# Patient Record
Sex: Male | Born: 1955 | Race: Black or African American | Hispanic: Yes | Marital: Married | Smoking: Current every day smoker
Health system: Southern US, Community
[De-identification: ages and names within clinical notes are randomized; demographics above are authoritative.]

## PROBLEM LIST (undated history)

## (undated) DIAGNOSIS — I219 Acute myocardial infarction, unspecified: Secondary | ICD-10-CM

## (undated) DIAGNOSIS — I519 Heart disease, unspecified: Secondary | ICD-10-CM

## (undated) DIAGNOSIS — I739 Peripheral vascular disease, unspecified: Secondary | ICD-10-CM

## (undated) DIAGNOSIS — Z87442 Personal history of urinary calculi: Secondary | ICD-10-CM

## (undated) DIAGNOSIS — K5792 Diverticulitis of intestine, part unspecified, without perforation or abscess without bleeding: Secondary | ICD-10-CM

## (undated) DIAGNOSIS — E119 Type 2 diabetes mellitus without complications: Secondary | ICD-10-CM

## (undated) DIAGNOSIS — I2510 Atherosclerotic heart disease of native coronary artery without angina pectoris: Secondary | ICD-10-CM

## (undated) DIAGNOSIS — K219 Gastro-esophageal reflux disease without esophagitis: Secondary | ICD-10-CM

## (undated) DIAGNOSIS — I10 Essential (primary) hypertension: Secondary | ICD-10-CM

## (undated) DIAGNOSIS — D649 Anemia, unspecified: Secondary | ICD-10-CM

## (undated) DIAGNOSIS — I639 Cerebral infarction, unspecified: Secondary | ICD-10-CM

## (undated) DIAGNOSIS — G459 Transient cerebral ischemic attack, unspecified: Secondary | ICD-10-CM

## (undated) HISTORY — PX: CARDIAC CATHETERIZATION: SHX172

## (undated) HISTORY — PX: CORONARY ANGIOPLASTY WITH STENT PLACEMENT: SHX49

## (undated) HISTORY — DX: Diverticulitis of intestine, part unspecified, without perforation or abscess without bleeding: K57.92

## (undated) HISTORY — DX: Type 2 diabetes mellitus without complications: E11.9

## (undated) HISTORY — DX: Atherosclerotic heart disease of native coronary artery without angina pectoris: I25.10

## (undated) HISTORY — DX: Cerebral infarction, unspecified: I63.9

## (undated) HISTORY — DX: Heart disease, unspecified: I51.9

## (undated) HISTORY — DX: Essential (primary) hypertension: I10

---

## 2008-10-07 HISTORY — PX: CORONARY ANGIOPLASTY WITH STENT PLACEMENT: SHX49

## 2015-10-08 DIAGNOSIS — G459 Transient cerebral ischemic attack, unspecified: Secondary | ICD-10-CM

## 2015-10-08 HISTORY — DX: Transient cerebral ischemic attack, unspecified: G45.9

## 2016-10-07 DIAGNOSIS — I639 Cerebral infarction, unspecified: Secondary | ICD-10-CM

## 2016-10-07 HISTORY — DX: Cerebral infarction, unspecified: I63.9

## 2018-02-05 DIAGNOSIS — E782 Mixed hyperlipidemia: Secondary | ICD-10-CM | POA: Insufficient documentation

## 2018-09-09 ENCOUNTER — Encounter: Payer: Self-pay | Admitting: Family Medicine

## 2018-09-09 ENCOUNTER — Ambulatory Visit: Payer: BLUE CROSS/BLUE SHIELD | Admitting: Family Medicine

## 2018-09-09 ENCOUNTER — Other Ambulatory Visit (HOSPITAL_COMMUNITY)
Admission: RE | Admit: 2018-09-09 | Discharge: 2018-09-09 | Disposition: A | Discharge status: Home / Self Care | Payer: BLUE CROSS/BLUE SHIELD | Source: Ambulatory Visit | Attending: Family Medicine | Admitting: Family Medicine

## 2018-09-09 VITALS — BP 130/80 | HR 99 | Temp 98.2°F | Wt 206.0 lb

## 2018-09-09 DIAGNOSIS — Z794 Long term (current) use of insulin: Secondary | ICD-10-CM | POA: Diagnosis not present

## 2018-09-09 DIAGNOSIS — I10 Essential (primary) hypertension: Secondary | ICD-10-CM

## 2018-09-09 DIAGNOSIS — E119 Type 2 diabetes mellitus without complications: Secondary | ICD-10-CM | POA: Diagnosis not present

## 2018-09-09 DIAGNOSIS — I2510 Atherosclerotic heart disease of native coronary artery without angina pectoris: Secondary | ICD-10-CM | POA: Diagnosis not present

## 2018-09-09 DIAGNOSIS — I63412 Cerebral infarction due to embolism of left middle cerebral artery: Secondary | ICD-10-CM

## 2018-09-09 DIAGNOSIS — E1165 Type 2 diabetes mellitus with hyperglycemia: Secondary | ICD-10-CM | POA: Insufficient documentation

## 2018-09-09 DIAGNOSIS — Z202 Contact with and (suspected) exposure to infections with a predominantly sexual mode of transmission: Secondary | ICD-10-CM

## 2018-09-09 DIAGNOSIS — Z8673 Personal history of transient ischemic attack (TIA), and cerebral infarction without residual deficits: Secondary | ICD-10-CM | POA: Insufficient documentation

## 2018-09-09 LAB — COMPREHENSIVE METABOLIC PANEL
ALT: 21 U/L (ref 0–53)
AST: 14 U/L (ref 0–37)
Albumin: 4.3 g/dL (ref 3.5–5.2)
Alkaline Phosphatase: 98 U/L (ref 39–117)
BUN: 14 mg/dL (ref 6–23)
CO2: 23 mEq/L (ref 19–32)
Calcium: 9.4 mg/dL (ref 8.4–10.5)
Chloride: 105 mEq/L (ref 96–112)
Creatinine, Ser: 0.92 mg/dL (ref 0.40–1.50)
GFR: 107.54 mL/min (ref >60.00)
Glucose, Bld: 237 mg/dL — ABNORMAL HIGH (ref 70–99)
Potassium: 3.9 mEq/L (ref 3.5–5.1)
Sodium: 136 mEq/L (ref 135–145)
Total Bilirubin: 0.6 mg/dL (ref 0.2–1.2)
Total Protein: 7.4 g/dL (ref 6.0–8.3)

## 2018-09-09 LAB — CBC
HCT: 40.1 % (ref 39.0–52.0)
Hemoglobin: 12.9 g/dL — ABNORMAL LOW (ref 13.0–17.0)
MCHC: 32.1 g/dL (ref 30.0–36.0)
MCV: 78.1 fl (ref 78.0–100.0)
Platelets: 252 10*3/uL (ref 150.0–400.0)
RBC: 5.14 Mil/uL (ref 4.22–5.81)
RDW: 24.7 % — ABNORMAL HIGH (ref 11.5–15.5)
WBC: 8 10*3/uL (ref 4.0–10.5)

## 2018-09-09 LAB — LDL CHOLESTEROL, DIRECT: Direct LDL: 112 mg/dL

## 2018-09-09 LAB — MICROALBUMIN / CREATININE URINE RATIO
Creatinine,U: 170.6 mg/dL
Microalb Creat Ratio: 1.9 mg/g (ref 0.0–30.0)
Microalb, Ur: 3.2 mg/dL — ABNORMAL HIGH (ref 0.0–1.9)

## 2018-09-09 LAB — HEMOGLOBIN A1C: Hgb A1c MFr Bld: 10.2 % — ABNORMAL HIGH (ref 4.6–6.5)

## 2018-09-09 MED ORDER — ASPIRIN-DIPYRIDAMOLE ER 25-200 MG PO CP12: 1.0000 | ORAL_CAPSULE | Freq: Two times a day (BID) | ORAL | 3 refills | Status: DC

## 2018-09-09 MED ORDER — ATORVASTATIN CALCIUM 80 MG PO TABS: 80.0000 mg | ORAL_TABLET | Freq: Every day | ORAL | 3 refills | Status: DC

## 2018-09-09 MED ORDER — BASAGLAR KWIKPEN 100 UNIT/ML ~~LOC~~ SOPN: 50.0000 [IU] | PEN_INJECTOR | Freq: Every day | SUBCUTANEOUS | 3 refills | Status: DC

## 2018-09-09 MED ORDER — INSULIN LISPRO (1 UNIT DIAL) 100 UNIT/ML (KWIKPEN): 15.0000 [IU] | PEN_INJECTOR | Freq: Three times a day (TID) | SUBCUTANEOUS | 3 refills | Status: DC

## 2018-09-09 MED ORDER — LISINOPRIL 10 MG PO TABS: 10.0000 mg | ORAL_TABLET | Freq: Every day | ORAL | 3 refills | Status: DC

## 2018-09-09 MED ORDER — METOPROLOL TARTRATE 25 MG PO TABS: 25.0000 mg | ORAL_TABLET | Freq: Two times a day (BID) | ORAL | 3 refills | Status: DC

## 2018-09-09 NOTE — Progress Notes (Signed)
Erik Rose - 62 y.o. male MRN [id number]  Date of birth: 1956-04-24  Subjective Chief Complaint  Patient presents with  . Hypertension    HPI Erik Rose is a 62 y.o. male with history of HTN, T2DM, previous CVA and CAD here today to establish care.  He is new to [place] after moving down from [place].  He has had regular medical care while in [place] but is needing refills of all medications.   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  He is taking aggrenox daily and is doing well.  He denies bleeding or gi upset.  He also has history of coronary stent.  He denies anginal symptoms, shortness of breath, palpitations headache or vision changes.  His BP has been well controlled with lopressor and lisinopril.  He is compliant with lipitor 35m.    -T2DM:  Current treatment with Basaglar 50 units daily and humalog 15 units with meals.  He is doing well with this and denies side effects including symptoms of hypoglycemia.  He is unsure of last a1c.  He had eye exam and flu vaccine prior to moving to [place].  He is unsure if he has had pneumonia vaccine.   ROS:  A comprehensive ROS was completed and negative except as noted per HPI   He also wishes to be checked to STI's.  He denies symptoms at this time.   No Known Allergies  Past Medical History:  Diagnosis Date  . Diabetes mellitus without complication ([place]   . Diverticulitis   . Heart disease   . Hypertension   . Stroke ([facility]     Past Surgical History:  Procedure Laterality Date  . CARDIAC CATHETERIZATION    . CORONARY ANGIOPLASTY WITH STENT PLACEMENT      Social History   Socioeconomic History  . Marital status: Legally Separated    Spouse name: Not on file  . Number of children: Not on file  . Years of education: Not on file  . Highest education level: Not on file  Occupational History  . Not on file  Social Needs  . Financial resource strain: Not on file  . Food insecurity:    Worry:  Not on file    Inability: Not on file  . Transportation needs:    Medical: Not on file    Non-medical: Not on file  Tobacco Use  . Smoking status: Current Some Day Smoker  . Smokeless tobacco: Never Used  Substance and Sexual Activity  . Alcohol use: Never    Frequency: Never  . Drug use: Never  . Sexual activity: Not on file  Lifestyle  . Physical activity:    Days per week: Not on file    Minutes per session: Not on file  . Stress: Not on file  Relationships  . Social connections:    Talks on phone: Not on file    Gets together: Not on file    Attends religious service: Not on file    Active member of club or organization: Not on file    Attends meetings of clubs or organizations: Not on file    Relationship status: Not on file  Other Topics Concern  . Not on file  Social History Narrative  . Not on file    Family History  Problem Relation Age of Onset  . Hypertension Mother   . Kidney disease Mother   . Miscarriages / [place]   . Stroke Mother   .  Heart disease Father   . Depression Sister   . Kidney disease Sister   . Diabetes Brother   . Hypertension Brother   . Hyperlipidemia Brother   . Kidney disease Brother     Health Maintenance  Topic Date Due  . Hepatitis C Screening  Apr 04, 1956  . HIV Screening  08/12/1972  . TETANUS/TDAP  08/12/1976  . COLONOSCOPY  08/13/2007  . INFLUENZA VACCINE  Completed    ----------------------------------------------------------------------------------------------------------------------------------------------------------------------------------------------------------------- Physical Exam BP 130/80   Pulse 99   Temp 98.2 F (36.8 C)   Wt 206 lb (93.4 kg)   SpO2 99%   Physical Exam  Constitutional: He is oriented to person, place, and time. He appears well-nourished. No distress.  HENT:  Head: Normocephalic and atraumatic.  Mouth/Throat: Oropharynx is clear and moist.  Eyes: No scleral icterus.    Neck: Neck supple. No thyromegaly present.  Cardiovascular: Normal rate, regular rhythm and normal heart sounds.  Pulmonary/Chest: Effort normal and breath sounds normal.  Musculoskeletal: He exhibits no edema.  Lymphadenopathy:    He has no cervical adenopathy.  Neurological: He is alert and oriented to person, place, and time.  Skin: Skin is warm and dry.  Psychiatric: He has a normal mood and affect. His behavior is normal.    ------------------------------------------------------------------------------------------------------------------------------------------------------------------------------------------------------------------- Assessment and Plan  CVA (cerebral vascular accident) ([place]) -Mild RUE residual -He is on aggrenox and lipitor, will continue.   Essential hypertension -BP is well controlled, will continue metoprolol and lisinopril.  -Reviewed low salt diet  Type 2 diabetes mellitus without complication, with long-term current use of insulin (HCC) -Insulin refilled -Check A1c and microalbumin today.   Orders Placed This Encounter  Procedures  . Comp Met (CMET)  . CBC  . HgB A1c  . Urine Microalbumin w/creat. ratio  . Direct LDL  . HIV antibody (with reflex)  . RPR

## 2018-09-09 NOTE — Assessment & Plan Note (Signed)
-  BP is well controlled, will continue metoprolol and lisinopril.  -Reviewed low salt diet

## 2018-09-09 NOTE — Patient Instructions (Signed)
Welcome! It was great to meet you today! I have sent in your medications and we will call you once your labwork returns.  See me again in 3 months

## 2018-09-09 NOTE — Assessment & Plan Note (Signed)
-  Mild RUE residual -He is on aggrenox and lipitor, will continue.

## 2018-09-09 NOTE — Assessment & Plan Note (Signed)
-  Insulin refilled -Check A1c and microalbumin today.

## 2018-09-10 LAB — RPR: RPR Ser Ql: NONREACTIVE

## 2018-09-10 LAB — HIV ANTIBODY (ROUTINE TESTING W REFLEX): HIV 1&2 Ab, 4th Generation: NONREACTIVE

## 2018-09-15 ENCOUNTER — Ambulatory Visit: Payer: Self-pay

## 2018-09-15 NOTE — Telephone Encounter (Signed)
Spoke with patient-will provide sample for testing tomorrow.

## 2018-09-15 NOTE — Telephone Encounter (Signed)
Pt has called several times for his lab results. No result notes available to release.

## 2018-09-15 NOTE — Progress Notes (Signed)
Please check on urine cytology results for GC/Chlamydia.  Thanks

## 2018-09-16 ENCOUNTER — Other Ambulatory Visit: Payer: BLUE CROSS/BLUE SHIELD

## 2018-09-16 NOTE — Progress Notes (Signed)
Urine sample missed from 12/4. Urine cytology ordered after collection and patient having left. Pt came in today 09/16/18 to collect urine for cytology order on 12/4.

## 2018-09-17 LAB — URINE CYTOLOGY ANCILLARY ONLY
Chlamydia: NEGATIVE
Neisseria Gonorrhea: NEGATIVE

## 2018-09-18 ENCOUNTER — Other Ambulatory Visit: Payer: Self-pay | Admitting: *Deleted

## 2018-09-18 DIAGNOSIS — E119 Type 2 diabetes mellitus without complications: Secondary | ICD-10-CM

## 2018-09-18 DIAGNOSIS — Z794 Long term (current) use of insulin: Secondary | ICD-10-CM

## 2018-09-18 MED ORDER — GLUCOSE BLOOD VI STRP: ORAL_STRIP | 12 refills | Status: DC

## 2018-09-18 MED ORDER — ONETOUCH ULTRA 2 W/DEVICE KIT: PACK | 0 refills | Status: DC

## 2018-09-18 NOTE — Progress Notes (Signed)
-  A1c is high at 10.2%, recommend increase of basaglar to 60 units.  -STD testing is negative.

## 2018-12-09 ENCOUNTER — Encounter: Payer: Self-pay | Admitting: Family Medicine

## 2018-12-09 ENCOUNTER — Other Ambulatory Visit: Payer: Self-pay

## 2018-12-09 ENCOUNTER — Ambulatory Visit: Payer: BLUE CROSS/BLUE SHIELD | Admitting: Family Medicine

## 2018-12-09 VITALS — BP 138/82 | HR 88 | Temp 98.0°F | Ht 70.0 in | Wt 199.6 lb

## 2018-12-09 DIAGNOSIS — Z23 Encounter for immunization: Secondary | ICD-10-CM | POA: Diagnosis not present

## 2018-12-09 DIAGNOSIS — K5730 Diverticulosis of large intestine without perforation or abscess without bleeding: Secondary | ICD-10-CM

## 2018-12-09 DIAGNOSIS — Z8673 Personal history of transient ischemic attack (TIA), and cerebral infarction without residual deficits: Secondary | ICD-10-CM

## 2018-12-09 DIAGNOSIS — Z1211 Encounter for screening for malignant neoplasm of colon: Secondary | ICD-10-CM

## 2018-12-09 DIAGNOSIS — I10 Essential (primary) hypertension: Secondary | ICD-10-CM

## 2018-12-09 DIAGNOSIS — Z794 Long term (current) use of insulin: Secondary | ICD-10-CM | POA: Diagnosis not present

## 2018-12-09 DIAGNOSIS — E1165 Type 2 diabetes mellitus with hyperglycemia: Secondary | ICD-10-CM | POA: Diagnosis not present

## 2018-12-09 DIAGNOSIS — I2510 Atherosclerotic heart disease of native coronary artery without angina pectoris: Secondary | ICD-10-CM

## 2018-12-09 DIAGNOSIS — Z86010 Personal history of colonic polyps: Secondary | ICD-10-CM

## 2018-12-09 LAB — HEMOGLOBIN A1C: Hgb A1c MFr Bld: 9.6 % — ABNORMAL HIGH (ref 4.6–6.5)

## 2018-12-09 MED ORDER — ASPIRIN-DIPYRIDAMOLE ER 25-200 MG PO CP12: 1.0000 | ORAL_CAPSULE | Freq: Two times a day (BID) | ORAL | 3 refills | Status: DC

## 2018-12-09 NOTE — Progress Notes (Signed)
Erik Rose - 63 y.o. male MRN [id number]  Date of birth: May 26, 1956  Subjective Chief Complaint  Patient presents with  . Follow-up    also discuss aggrenox   . Diabetes    Pt has been checking his sugars 3 times a day, ranging in the 100s-130s   . Hypertension    HPI Erik Rose is a 63 y.o. male here today for f/u of DM and HTN.   -HTN:  Curent treatment with lisinopril and metoprolol.  Doing well with current medications and denies side effects.  He follow a low salt diet and remains fairly active.   -DM:  Current management with basaglar 60 units daily and humalog 15 units with meals.  He reports glucose readings at home are typically 100-130.  He has had an occasional low reading into the upper 50s if not eating properly.  He continues to walk for exercise.  He needs updated eye exam.    -History of CVA/CAD:  History of MI with stenting as well as CVA.  Still with mild RUE deficit.  He continues on daily aggrenox and atorvastatin 80mg .  He has not had any anginal symptoms, dyspnea on exertion, or palpitations.   Records from [place] via [facility], vaccines and screenings updated.  Had colonoscopy in 2017 following GI bleed with pancolonic diverticulosis.  Also with history of colonic polyps and recommended to have colonoscopy q3 years.   ROS:  A comprehensive ROS was completed and negative except as noted per HPI   No Known Allergies  Past Medical History:  Diagnosis Date  . Diabetes mellitus without complication (HCC)   . Diverticulitis   . Heart disease   . Hypertension   . Stroke [facility])     Past Surgical History:  Procedure Laterality Date  . CARDIAC CATHETERIZATION    . CORONARY ANGIOPLASTY WITH STENT PLACEMENT      Social History   Socioeconomic History  . Marital status: Legally Separated    Spouse name: Not on file  . Number of children: Not on file  . Years of education: Not on file  . Highest education level: Not on file    Occupational History  . Not on file  Social Needs  . Financial resource strain: Not on file  . Food insecurity:    Worry: Not on file    Inability: Not on file  . Transportation needs:    Medical: Not on file    Non-medical: Not on file  Tobacco Use  . Smoking status: Current Some Day Smoker  . Smokeless tobacco: Never Used  Substance and Sexual Activity  . Alcohol use: Never    Frequency: Never  . Drug use: Never  . Sexual activity: Yes  Lifestyle  . Physical activity:    Days per week: Not on file    Minutes per session: Not on file  . Stress: Not on file  Relationships  . Social connections:    Talks on phone: Not on file    Gets together: Not on file    Attends religious service: Not on file    Active member of club or organization: Not on file    Attends meetings of clubs or organizations: Not on file    Relationship status: Not on file  Other Topics Concern  . Not on file  Social History Narrative  . Not on file    Family History  Problem Relation Age of Onset  . Hypertension Mother   . Kidney disease  Mother   . Miscarriages / [place] Mother   . Stroke Mother   . Heart disease Father   . Depression Sister   . Kidney disease Sister   . Diabetes Brother   . Hypertension Brother   . Hyperlipidemia Brother   . Kidney disease Brother     Health Maintenance  Topic Date Due  . Hepatitis C Screening  02/14/1956  . PNEUMOCOCCAL POLYSACCHARIDE VACCINE AGE 17-64 HIGH RISK  08/13/1959  . FOOT EXAM  08/13/1967  . OPHTHALMOLOGY EXAM  08/13/1967  . TETANUS/TDAP  08/12/1976  . COLONOSCOPY  01/17/2019  . HEMOGLOBIN A1C  03/11/2019  . INFLUENZA VACCINE  Completed  . HIV Screening  Completed    ----------------------------------------------------------------------------------------------------------------------------------------------------------------------------------------------------------------- Physical Exam BP 138/82 (BP Location: Left Arm, Patient  Position: Sitting, Cuff Size: Normal)   Pulse 88   Temp 98 F (36.7 C) (Oral)   Ht 5\' 10"  (1.778 m)   Wt 199 lb 9.6 oz (90.5 kg)   SpO2 100%   BMI 28.64 kg/m   Physical Exam Constitutional:      Appearance: Normal appearance.  HENT:     Head: Normocephalic and atraumatic.     Right Ear: Tympanic membrane normal.     Left Ear: Tympanic membrane normal.     Mouth/Throat:     Mouth: Mucous membranes are moist.  Eyes:     General: No scleral icterus. Neck:     Musculoskeletal: Neck supple.  Cardiovascular:     Rate and Rhythm: Normal rate and regular rhythm.  Pulmonary:     Effort: Pulmonary effort is normal.     Breath sounds: Normal breath sounds.  Skin:    General: Skin is warm and dry.     Findings: No rash.  Neurological:     General: No focal deficit present.     Mental Status: He is alert.  Psychiatric:        Mood and Affect: Mood normal.        Behavior: Behavior normal.     ------------------------------------------------------------------------------------------------------------------------------------------------------------------------------------------------------------------- Assessment and Plan  Essential hypertension -BP well controlled today, continue current medications  Type 2 diabetes mellitus with hyperglycemia, with long-term current use of insulin (HCC) -Previous a1c was quite high, but home glucose readings have been better controlled recently.  -Update today and continue to titrate medications as needed.  -Pneumovax given today -Referral placed to ophthalmology   History of CVA (cerebrovascular accident) -Very mild RUE residual deficits -Continue aggrenox and statin, keep BP under good control.   CAD (coronary artery disease), native coronary artery -Stable without anginal symptoms -Continue current medications.   Colon cancer screening -Previous colonoscopy in [place] 01/2016, recommended to have q3 years. -Referral placed.

## 2018-12-09 NOTE — Assessment & Plan Note (Signed)
-  Very mild RUE residual deficits -Continue aggrenox and statin, keep BP under good control.

## 2018-12-09 NOTE — Assessment & Plan Note (Signed)
-  Previous a1c was quite high, but home glucose readings have been better controlled recently.  -Update today and continue to titrate medications as needed.  -Pneumovax given today -Referral placed to ophthalmology

## 2018-12-09 NOTE — Patient Instructions (Signed)
Diabetes Mellitus and Standards of Medical Care  Managing diabetes (diabetes mellitus) can be complicated. Your diabetes treatment may be managed by a team of health care providers, including:  A physician who specializes in diabetes (endocrinologist).  A nurse practitioner or physician assistant.  Nurses.  A diet and nutrition specialist (registered dietitian).  A certified diabetes educator (CDE).  An exercise specialist.  A pharmacist.  An eye doctor.  A foot specialist (podiatrist).  A dentist.  A primary care provider.  A mental health provider.  Your health care providers follow guidelines to help you get the best quality of care. The following schedule is a general guideline for your diabetes management plan. Your health care providers may give you more specific instructions.  Physical exams  Upon being diagnosed with diabetes mellitus, and each year after that, your health care provider will ask about your medical and family history. He or she will also do a physical exam. Your exam may include:  Measuring your height, weight, and body mass index (BMI).  Checking your blood pressure. This will be done at every routine medical visit. Your target blood pressure may vary depending on your medical conditions, your age, and other factors.  Thyroid gland exam.  Skin exam.  Screening for damage to your nerves (peripheral neuropathy). This may include checking the pulse in your legs and feet and checking the level of sensation in your hands and feet.  A complete foot exam to inspect the structure and skin of your feet, including checking for cuts, bruises, redness, blisters, sores, or other problems.  Screening for blood vessel (vascular) problems, which may include checking the pulse in your legs and feet and checking your temperature.  Blood tests  Depending on your treatment plan and your personal needs, you may have the following tests done:  HbA1c (hemoglobin A1c). This test provides information about blood  sugar (glucose) control over the previous 2-3 months. It is used to adjust your treatment plan, if needed. This test will be done:  At least 2 times a year, if you are meeting your treatment goals.  4 times a year, if you are not meeting your treatment goals or if treatment goals have changed.  Lipid testing, including total, LDL, and HDL cholesterol and triglyceride levels.  The goal for LDL is less than 100 mg/dL (5.5 mmol/L). If you are at high risk for complications, the goal is less than 70 mg/dL (3.9 mmol/L).  The goal for HDL is 40 mg/dL (2.2 mmol/L) or higher for men and 50 mg/dL (2.8 mmol/L) or higher for women. An HDL cholesterol of 60 mg/dL (3.3 mmol/L) or higher gives some protection against heart disease.  The goal for triglycerides is less than 150 mg/dL (8.3 mmol/L).  Liver function tests.  Kidney function tests.  Thyroid function tests.  Dental and eye exams  Visit your dentist two times a year.  If you have type 1 diabetes, your health care provider may recommend an eye exam 3-5 years after you are diagnosed, and then once a year after your first exam.  For children with type 1 diabetes, a health care provider may recommend an eye exam when your child is age 10 or older and has had diabetes for 3-5 years. After the first exam, your child should get an eye exam once a year.  If you have type 2 diabetes, your health care provider may recommend an eye exam as soon as you are diagnosed, and then once a year after   your first exam.  Immunizations    The yearly flu (influenza) vaccine is recommended for everyone 6 months or older who has diabetes.  The pneumonia (pneumococcal) vaccine is recommended for everyone 2 years or older who has diabetes. If you are 65 or older, you may get the pneumonia vaccine as a series of two separate shots.  The hepatitis B vaccine is recommended for adults shortly after being diagnosed with diabetes.  Adults and children with diabetes should receive all other vaccines  according to age-specific recommendations from the Centers for Disease Control and Prevention (CDC).  Mental and emotional health  Screening for symptoms of eating disorders, anxiety, and depression is recommended at the time of diagnosis and afterward as needed. If your screening shows that you have symptoms (positive screening result), you may need more evaluation and you may work with a mental health care provider.  Treatment plan  Your treatment plan will be reviewed at every medical visit. You and your health care provider will discuss:  How you are taking your medicines, including insulin.  Any side effects you are experiencing.  Your blood glucose target goals.  The frequency of your blood glucose monitoring.  Lifestyle habits, such as activity level as well as tobacco, alcohol, and substance use.  Diabetes self-management education  Your health care provider will assess how well you are monitoring your blood glucose levels and whether you are taking your insulin correctly. He or she may refer you to:  A certified diabetes educator to manage your diabetes throughout your life, starting at diagnosis.  A registered dietitian who can create or review your personal nutrition plan.  An exercise specialist who can discuss your activity level and exercise plan.  Summary  Managing diabetes (diabetes mellitus) can be complicated. Your diabetes treatment may be managed by a team of health care providers.  Your health care providers follow guidelines in order to help you get the best quality of care.  Standards of care including having regular physical exams, blood tests, blood pressure monitoring, immunizations, screening tests, and education about how to manage your diabetes.  Your health care providers may also give you more specific instructions based on your individual health.  This information is not intended to replace advice given to you by your health care provider. Make sure you discuss any questions you have  with your health care provider.  Document Released: 07/21/2009 Document Revised: 06/12/2018 Document Reviewed: 06/21/2016  Elsevier Interactive Patient Education  2019 [employer]

## 2018-12-09 NOTE — Assessment & Plan Note (Signed)
-  BP well controlled today, continue current medications

## 2018-12-09 NOTE — Assessment & Plan Note (Signed)
-  Stable without anginal symptoms -Continue current medications.

## 2018-12-09 NOTE — Assessment & Plan Note (Signed)
-  Previous colonoscopy in [place] 01/2016, recommended to have q3 years. -Referral placed.

## 2018-12-14 NOTE — Progress Notes (Signed)
A1c improved compared to last reading but remains elevated.  Please have him check glucose readings 1-2 hours after meals as we may need to increase his humalog dosing at meal time.

## 2018-12-23 DIAGNOSIS — E113393 Type 2 diabetes mellitus with moderate nonproliferative diabetic retinopathy without macular edema, bilateral: Secondary | ICD-10-CM | POA: Diagnosis not present

## 2018-12-23 DIAGNOSIS — H25813 Combined forms of age-related cataract, bilateral: Secondary | ICD-10-CM | POA: Diagnosis not present

## 2018-12-23 DIAGNOSIS — H35033 Hypertensive retinopathy, bilateral: Secondary | ICD-10-CM | POA: Diagnosis not present

## 2019-03-11 ENCOUNTER — Telehealth (INDEPENDENT_AMBULATORY_CARE_PROVIDER_SITE_OTHER): Payer: BLUE CROSS/BLUE SHIELD | Admitting: Family Medicine

## 2019-03-11 ENCOUNTER — Encounter: Payer: Self-pay | Admitting: Family Medicine

## 2019-03-11 VITALS — Temp 97.2°F | Ht 70.0 in | Wt 194.0 lb

## 2019-03-11 DIAGNOSIS — E1165 Type 2 diabetes mellitus with hyperglycemia: Secondary | ICD-10-CM | POA: Diagnosis not present

## 2019-03-11 DIAGNOSIS — I10 Essential (primary) hypertension: Secondary | ICD-10-CM | POA: Diagnosis not present

## 2019-03-11 DIAGNOSIS — Z8673 Personal history of transient ischemic attack (TIA), and cerebral infarction without residual deficits: Secondary | ICD-10-CM | POA: Diagnosis not present

## 2019-03-11 DIAGNOSIS — Z794 Long term (current) use of insulin: Secondary | ICD-10-CM | POA: Diagnosis not present

## 2019-03-11 MED ORDER — BLOOD PRESSURE CUFF MISC: 0 refills | Status: DC

## 2019-03-11 MED ORDER — ASPIRIN-DIPYRIDAMOLE ER 25-200 MG PO CP12: 1.0000 | ORAL_CAPSULE | Freq: Two times a day (BID) | ORAL | 3 refills | Status: DC

## 2019-03-11 NOTE — Progress Notes (Signed)
Erik Rose - 63 y.o. male MRN [id number]  Date of birth: 02-Feb-1956   This visit type was conducted due to national recommendations for restrictions regarding the COVID-19 Pandemic (e.g. social distancing).  This format is felt to be most appropriate for this patient at this time.  All issues noted in this document were discussed and addressed.  No physical exam was performed (except for noted visual exam findings with Video Visits).  I discussed the limitations of evaluation and management by telemedicine and the availability of in person appointments. The patient expressed understanding and agreed to proceed.  I connected with@ on 03/11/19 at 10:30 AM EDT by a video enabled telemedicine application and verified that I am speaking with the correct person using two identifiers.   Patient Location: [place] [id number]   Provider location:   Home office  Chief Complaint  Patient presents with  . Follow-up    3 Mo F/U DM/HTN, meds working, FGBS 40's-330's- depending on meals/snacking at bedtime    HPI  Erik Rose is a 63 y.o. male who presents via audio/video conferencing for a telehealth visit today.  He is following up today for HTN, DM and HLD.  Has been out of aggrenox, unsure why not being filled with other medications.   -HTN:  He continues on lisinopril and metoprolol.  Doing well.  Does not have cuff at home to monitor BP.  Denies symptoms of elevated BP including headache ,vision changes, chest pain, shortness of breath.   -DM: Currently on basaglar 50 units daily with humalog 15 units with meals.  FBG around 120 each morning.  Post prandial readings range from 170-300 depending on what he has eaten.  He has been recording these and will upload to mychart.  He had 1 episode of hypoglycemia a couple of weeks ago.  He was swimming and forgot to eat.  Lowest reading was 71.   -HLD:  History of CVA and CAD, tolerating atorvastatin well.   Denies myalgias.   ROS:  A comprehensive ROS was completed and negative except as noted per HPI  Past Medical History:  Diagnosis Date  . Diabetes mellitus without complication ([place])   . Diverticulitis   . Heart disease   . Hypertension   . Stroke [facility])     Past Surgical History:  Procedure Laterality Date  . CARDIAC CATHETERIZATION    . CORONARY ANGIOPLASTY WITH STENT PLACEMENT      Family History  Problem Relation Age of Onset  . Hypertension Mother   . Kidney disease Mother   . Miscarriages / [place] Mother   . Stroke Mother   . Heart disease Father   . Depression Sister   . Kidney disease Sister   . Diabetes Brother   . Hypertension Brother   . Hyperlipidemia Brother   . Kidney disease Brother     Social History   Socioeconomic History  . Marital status: Legally Separated    Spouse name: Not on file  . Number of children: Not on file  . Years of education: Not on file  . Highest education level: Not on file  Occupational History  . Not on file  Social Needs  . Financial resource strain: Not on file  . Food insecurity:    Worry: Not on file    Inability: Not on file  . Transportation needs:    Medical: Not on file    Non-medical: Not on file  Tobacco Use  .  Smoking status: Current Some Day Smoker  . Smokeless tobacco: Never Used  Substance and Sexual Activity  . Alcohol use: Never    Frequency: Never  . Drug use: Never  . Sexual activity: Yes  Lifestyle  . Physical activity:    Days per week: Not on file    Minutes per session: Not on file  . Stress: Not on file  Relationships  . Social connections:    Talks on phone: Not on file    Gets together: Not on file    Attends religious service: Not on file    Active member of club or organization: Not on file    Attends meetings of clubs or organizations: Not on file    Relationship status: Not on file  . Intimate partner violence:    Fear of current or ex partner: Not on file    Emotionally  abused: Not on file    Physically abused: Not on file    Forced sexual activity: Not on file  Other Topics Concern  . Not on file  Social History Narrative  . Not on file     Current Outpatient Medications:  .  atorvastatin (LIPITOR) 80 MG tablet, Take 1 tablet (80 mg total) by mouth daily., Disp: 90 tablet, Rfl: 3 .  Blood Glucose Monitoring Suppl (ONE TOUCH ULTRA 2) w/Device KIT, Use to take blood sugar one to two times daily, Disp: 1 each, Rfl: 0 .  dipyridamole-aspirin (AGGRENOX) 200-25 MG 12hr capsule, Take 1 capsule by mouth 2 (two) times daily., Disp: 90 capsule, Rfl: 3 .  glucose blood test strip, Use to take blood sugar one to two times daily, Disp: 100 each, Rfl: 12 .  lisinopril (PRINIVIL,ZESTRIL) 10 MG tablet, Take 1 tablet (10 mg total) by mouth daily., Disp: 90 tablet, Rfl: 3 .  metoprolol tartrate (LOPRESSOR) 25 MG tablet, Take 1 tablet (25 mg total) by mouth 2 (two) times daily., Disp: 180 tablet, Rfl: 3 .  Insulin Glargine (BASAGLAR KWIKPEN) 100 UNIT/ML SOPN, Inject 0.5 mLs (50 Units total) into the skin daily., Disp: 45 mL, Rfl: 3 .  insulin lispro (HUMALOG KWIKPEN) 100 UNIT/ML KwikPen, Inject 0.15 mLs (15 Units total) into the skin 3 (three) times daily., Disp: 15 mL, Rfl: 3  EXAM:  VITALS per patient if applicable: Temp (!) 21.9 F (36.2 C) (Oral) Comment: Pt took @ hm  Ht _0  (1.778 m)   Wt 194 lb (88 kg) Comment: Pt has notice decreased in wt. self reporting w/out scales  BMI 27.84 kg/m   GENERAL: alert, oriented, appears well and in no acute distress  HEENT: atraumatic, conjunttiva clear, no obvious abnormalities on inspection of external nose and ears  NECK: normal movements of the head and neck  LUNGS: on inspection no signs of respiratory distress, breathing rate appears normal, no obvious gross SOB, gasping or wheezing  CV: no obvious cyanosis  MS: moves all visible extremities without noticeable abnormality  PSYCH/NEURO: pleasant and  cooperative, no obvious depression or anxiety, speech and thought processing grossly intact  ASSESSMENT AND PLAN:  Discussed the following assessment and plan:  Type 2 diabetes mellitus with hyperglycemia, with long-term current use of insulin (HCC) -Will review post prandial readings once uploaded and recheck a1c.  -May need to go up on humalog at mealtime slightly.   Essential hypertension -Stable, continue current medications.   History of CVA (cerebrovascular accident) -Has been out of aggrenox, rx sent again.  He will let me know if still  not receiving.  -Continue statin.        I discussed the assessment and treatment plan with the patient. The patient was provided an opportunity to ask questions and all were answered. The patient agreed with the plan and demonstrated an understanding of the instructions.   The patient was advised to call back or seek an in-person evaluation if the symptoms worsen or if the condition fails to improve as anticipated.     [name], DO

## 2019-03-11 NOTE — Assessment & Plan Note (Signed)
-  Has been out of aggrenox, rx sent again.  He will let me know if still not receiving.  -Continue statin.

## 2019-03-11 NOTE — Assessment & Plan Note (Signed)
Stable, continue current medications.  

## 2019-03-11 NOTE — Assessment & Plan Note (Signed)
-  Will review post prandial readings once uploaded and recheck a1c.  -May need to go up on humalog at mealtime slightly.

## 2019-03-12 ENCOUNTER — Other Ambulatory Visit: Payer: PRIVATE HEALTH INSURANCE

## 2019-04-16 ENCOUNTER — Encounter: Payer: Self-pay | Admitting: Family Medicine

## 2019-05-11 ENCOUNTER — Telehealth: Payer: Self-pay

## 2019-05-11 NOTE — Telephone Encounter (Signed)

## 2019-05-12 ENCOUNTER — Other Ambulatory Visit: Payer: Self-pay

## 2019-05-12 ENCOUNTER — Other Ambulatory Visit (INDEPENDENT_AMBULATORY_CARE_PROVIDER_SITE_OTHER): Payer: Medicare Other

## 2019-05-12 DIAGNOSIS — E1165 Type 2 diabetes mellitus with hyperglycemia: Secondary | ICD-10-CM | POA: Diagnosis not present

## 2019-05-12 DIAGNOSIS — Z794 Long term (current) use of insulin: Secondary | ICD-10-CM

## 2019-05-12 LAB — BASIC METABOLIC PANEL
BUN: 17 mg/dL (ref 6–23)
CO2: 27 mEq/L (ref 19–32)
Calcium: 9.4 mg/dL (ref 8.4–10.5)
Chloride: 104 mEq/L (ref 96–112)
Creatinine, Ser: 0.99 mg/dL (ref 0.40–1.50)
GFR: 92.76 mL/min (ref >60.00)
Glucose, Bld: 243 mg/dL — ABNORMAL HIGH (ref 70–99)
Potassium: 4.6 mEq/L (ref 3.5–5.1)
Sodium: 137 mEq/L (ref 135–145)

## 2019-05-12 LAB — HEMOGLOBIN A1C: Hgb A1c MFr Bld: 10.1 % — ABNORMAL HIGH (ref 4.6–6.5)

## 2019-05-14 ENCOUNTER — Other Ambulatory Visit: Payer: Self-pay | Admitting: Family Medicine

## 2019-06-04 ENCOUNTER — Other Ambulatory Visit: Payer: Self-pay | Admitting: Family Medicine

## 2019-06-04 NOTE — Telephone Encounter (Signed)
Requested medication (s) are due for refill today: yes  Requested medication (s) are on the active medication list:yes  Last refill:    Future visit scheduled: no}  Notes to clinic:  Review for refill   Requested Prescriptions  Pending Prescriptions Disp Refills   Insulin Glargine (BASAGLAR KWIKPEN) 100 UNIT/ML SOPN 45 mL 3    Sig: Inject 0.5 mLs (50 Units total) into the skin daily.     There is no refill protocol information for this order

## 2019-06-04 NOTE — Telephone Encounter (Signed)
Medication Refill - Medication: Insulin Glargine (BASAGLAR KWIKPEN) 100 UNIT/ML SOPN [[id number]]   Pt called with substitutions covered by insurance:  Lantus  Toujeo  Tresiba  Levemir    Pt requests follow up on recommended alternative via MyChart, please advise.  Has the patient contacted their pharmacy? Yes   (Agent: If no, request that the patient contact the pharmacy for the refill.) (Agent: If yes, when and what did the pharmacy advise?)  Preferred Pharmacy (with phone number or street name):  [facility] [place], [address]  [place] [address]  Phone: [phone] Fax: [phone]     Agent: Please be advised that RX refills may take up to 3 business days. We ask that you follow-up with your pharmacy.

## 2019-06-07 MED ORDER — LANTUS SOLOSTAR 100 UNIT/ML ~~LOC~~ SOPN: 55.0000 [IU] | PEN_INJECTOR | Freq: Every day | SUBCUTANEOUS | 1 refills | Status: DC

## 2019-06-21 ENCOUNTER — Telehealth: Payer: Self-pay | Admitting: Family Medicine

## 2019-06-21 NOTE — Telephone Encounter (Signed)

## 2019-06-22 ENCOUNTER — Ambulatory Visit (INDEPENDENT_AMBULATORY_CARE_PROVIDER_SITE_OTHER): Payer: Medicare Other | Admitting: Behavioral Health

## 2019-06-22 DIAGNOSIS — Z23 Encounter for immunization: Secondary | ICD-10-CM

## 2019-06-22 NOTE — Progress Notes (Signed)
Patient presents in office today for Influenza vaccination. IM injection was given in the left deltoid. Patient tolerated the injection well. No signs or symptoms of a reaction were noted prior to patient leaving the nurse visit. 

## 2019-06-28 DIAGNOSIS — H35033 Hypertensive retinopathy, bilateral: Secondary | ICD-10-CM | POA: Diagnosis not present

## 2019-06-28 DIAGNOSIS — H25813 Combined forms of age-related cataract, bilateral: Secondary | ICD-10-CM | POA: Diagnosis not present

## 2019-06-28 DIAGNOSIS — E113393 Type 2 diabetes mellitus with moderate nonproliferative diabetic retinopathy without macular edema, bilateral: Secondary | ICD-10-CM | POA: Diagnosis not present

## 2019-07-26 ENCOUNTER — Telehealth: Payer: Self-pay | Admitting: Family Medicine

## 2019-07-27 ENCOUNTER — Telehealth: Payer: Self-pay

## 2019-07-27 NOTE — Telephone Encounter (Signed)
No notes on who may have called pt.

## 2019-07-27 NOTE — Telephone Encounter (Signed)
Copied from [place] [id number]. Topic: General - Inquiry >> Jul 26, 2019  2:39 PM Alease Frame wrote: Reason for CRM: Patient called in he said he received a automated call in regards to his blood sugar information. Please call patient back

## 2019-08-25 ENCOUNTER — Other Ambulatory Visit: Payer: Self-pay | Admitting: Family Medicine

## 2019-09-03 ENCOUNTER — Other Ambulatory Visit: Payer: Self-pay | Admitting: Family Medicine

## 2019-09-06 ENCOUNTER — Other Ambulatory Visit: Payer: Self-pay

## 2019-09-09 ENCOUNTER — Other Ambulatory Visit: Payer: Self-pay

## 2019-09-09 NOTE — Patient Outreach (Signed)
[facility] Care Management  09/09/2019  Solan Vosler 1956/09/19 [id number]   Medication Adherence call to Mr. Angelgabriel Willmore [facility] Ident[facility] Verify spoke with patient he is past due on Lisinopril 10 mg,patient explain he had call [facility] they said they need to call doctors office for refill,when calling [facility] they have the prescription on hold but will fill for patient to pick. Mr. Wehner is showing past due under [place].   [facility] Management Direct Dial [phone]  Fax [phone] [email or web address]

## 2019-09-10 ENCOUNTER — Other Ambulatory Visit: Payer: Self-pay | Admitting: Family Medicine

## 2019-10-14 DIAGNOSIS — Z03818 Encounter for observation for suspected exposure to other biological agents ruled out: Secondary | ICD-10-CM | POA: Diagnosis not present

## 2019-11-29 DIAGNOSIS — E113393 Type 2 diabetes mellitus with moderate nonproliferative diabetic retinopathy without macular edema, bilateral: Secondary | ICD-10-CM | POA: Diagnosis not present

## 2019-11-29 DIAGNOSIS — H35033 Hypertensive retinopathy, bilateral: Secondary | ICD-10-CM | POA: Diagnosis not present

## 2019-11-29 DIAGNOSIS — H25813 Combined forms of age-related cataract, bilateral: Secondary | ICD-10-CM | POA: Diagnosis not present

## 2019-12-18 ENCOUNTER — Other Ambulatory Visit: Payer: Self-pay | Admitting: Family Medicine

## 2019-12-20 NOTE — Telephone Encounter (Signed)
Must sched appt 1st/no-showed in June/thx dmf

## 2020-01-11 ENCOUNTER — Other Ambulatory Visit: Payer: Self-pay | Admitting: Family Medicine

## 2020-01-11 DIAGNOSIS — Z794 Long term (current) use of insulin: Secondary | ICD-10-CM

## 2020-01-11 DIAGNOSIS — E119 Type 2 diabetes mellitus without complications: Secondary | ICD-10-CM

## 2020-01-11 MED ORDER — GLUCOSE BLOOD VI STRP: ORAL_STRIP | 0 refills | Status: DC

## 2020-01-18 ENCOUNTER — Encounter: Payer: Self-pay | Admitting: Family Medicine

## 2020-01-18 ENCOUNTER — Ambulatory Visit (INDEPENDENT_AMBULATORY_CARE_PROVIDER_SITE_OTHER): Payer: Medicare Other | Admitting: Family Medicine

## 2020-01-18 ENCOUNTER — Other Ambulatory Visit: Payer: Self-pay

## 2020-01-18 VITALS — BP 124/88 | HR 83 | Temp 98.2°F | Ht 70.0 in | Wt 215.0 lb

## 2020-01-18 DIAGNOSIS — L309 Dermatitis, unspecified: Secondary | ICD-10-CM | POA: Diagnosis not present

## 2020-01-18 DIAGNOSIS — E1165 Type 2 diabetes mellitus with hyperglycemia: Secondary | ICD-10-CM | POA: Diagnosis not present

## 2020-01-18 DIAGNOSIS — E782 Mixed hyperlipidemia: Secondary | ICD-10-CM | POA: Diagnosis not present

## 2020-01-18 DIAGNOSIS — Z794 Long term (current) use of insulin: Secondary | ICD-10-CM

## 2020-01-18 DIAGNOSIS — Z8673 Personal history of transient ischemic attack (TIA), and cerebral infarction without residual deficits: Secondary | ICD-10-CM | POA: Diagnosis not present

## 2020-01-18 DIAGNOSIS — I10 Essential (primary) hypertension: Secondary | ICD-10-CM

## 2020-01-18 MED ORDER — TRIAMCINOLONE ACETONIDE 0.1 % EX CREA: 1.0000 "application " | TOPICAL_CREAM | Freq: Two times a day (BID) | CUTANEOUS | 0 refills | Status: DC

## 2020-01-18 NOTE — Assessment & Plan Note (Addendum)
Previously on aggrenox, has discontinued due to running out of medication.  He does not want to restart aggrenox and would prefer to take asa only.   Continue statin.

## 2020-01-18 NOTE — Progress Notes (Signed)
Erik Rose - 64 y.o. male MRN [id number]  Date of birth: Aug 02, 1956  Subjective Chief Complaint  Patient presents with  . Hypertension    HPI Erik Rose is a 64 y.o. male with history of HTN, T2DM, and history of CVA here today for follow up.  He also has complaint of rash on upper thighs.    -HTN:  Current treatment with lisinopril and metoprolol.  He is doing well with this and and denies side effects.  He has not had any symptoms related to HTN including chest pain, shortness of breath, palpitations, headache or vision changes.   -T2DM:  Managed with lantus 55 units daily and humalog 15 units tid with meals.  He reports that blood sugars typically range form 90-145.  He denies hypoglycemia.    -History of CVA:  Previously on plavix however had GI bleed while taking and was changed to aggrenox.  He has been out of aggrenox for several months and is not taking any antiplatelet therapy.  He prefers not to restart aggrenox but would be willing to take asa.    -Rash:  Rash on upper, inner thighs and back of L elbow.  Rash is itchy.  He denies changes to soaps or detergents.  He has tried moisturizer which has helped some.    ROS:  A comprehensive ROS was completed and negative except as noted per HPI  No Known Allergies  Past Medical History:  Diagnosis Date  . Diabetes mellitus without complication (HCC)   . Diverticulitis   . Heart disease   . Hypertension   . Stroke [facility])     Past Surgical History:  Procedure Laterality Date  . CARDIAC CATHETERIZATION    . CORONARY ANGIOPLASTY WITH STENT PLACEMENT      Social History   Socioeconomic History  . Marital status: Legally Separated    Spouse name: Not on file  . Number of children: Not on file  . Years of education: Not on file  . Highest education level: Not on file  Occupational History  . Not on file  Tobacco Use  . Smoking status: Current Some Day Smoker  . Smokeless tobacco: Never Used  Substance  and Sexual Activity  . Alcohol use: Never  . Drug use: Never  . Sexual activity: Yes  Other Topics Concern  . Not on file  Social History Narrative  . Not on file   Social Determinants of Health   Financial Resource Strain:   . Difficulty of Paying Living Expenses:   Food Insecurity:   . Worried About Programme researcher, broadcasting/film/video in the Last Year:   . Barista in the Last Year:   Transportation Needs:   . Freight forwarder (Medical):   Marland Kitchen Lack of Transportation (Non-Medical):   Physical Activity:   . Days of Exercise per Week:   . Minutes of Exercise per Session:   Stress:   . Feeling of Stress :   Social Connections:   . Frequency of Communication with Friends and Family:   . Frequency of Social Gatherings with Friends and Family:   . Attends Religious Services:   . Active Member of Clubs or Organizations:   . Attends Banker Meetings:   Marland Kitchen Marital Status:     Family History  Problem Relation Age of Onset  . Hypertension Mother   . Kidney disease Mother   . Miscarriages / [place] Mother   . Stroke Mother   . Heart disease Father   .  Depression Sister   . Kidney disease Sister   . Diabetes Brother   . Hypertension Brother   . Hyperlipidemia Brother   . Kidney disease Brother     Health Maintenance  Topic Date Due  . Hepatitis C Screening  Never done  . FOOT EXAM  Never done  . OPHTHALMOLOGY EXAM  Never done  . TETANUS/TDAP  Never done  . COLONOSCOPY  01/17/2019  . HEMOGLOBIN A1C  11/12/2019  . INFLUENZA VACCINE  05/07/2020  . PNEUMOCOCCAL POLYSACCHARIDE VACCINE AGE 27-64 HIGH RISK  Completed  . HIV Screening  Completed     ----------------------------------------------------------------------------------------------------------------------------------------------------------------------------------------------------------------- Physical Exam BP 124/88   Pulse 83   Temp 98.2 F (36.8 C) (Oral)   Ht 5\' 10"  (1.778 m)   Wt 215 lb  (97.5 kg)   BMI 30.85 kg/m   Physical Exam Constitutional:      Appearance: Normal appearance.  HENT:     Head: Normocephalic and atraumatic.  Eyes:     General: No scleral icterus. Cardiovascular:     Rate and Rhythm: Normal rate and regular rhythm.  Pulmonary:     Effort: Pulmonary effort is normal.     Breath sounds: Normal breath sounds.  Skin:    General: Skin is warm.     Comments: Dry, scaly appearing rash on upper, inner thighs.  Non-erythematous.   Similar appearance to posterior elbow.   Neurological:     General: No focal deficit present.     Mental Status: He is alert.  Psychiatric:        Mood and Affect: Mood normal.        Behavior: Behavior normal.     ------------------------------------------------------------------------------------------------------------------------------------------------------------------------------------------------------------------- Assessment and Plan  Essential hypertension Blood pressure is at goal at for age and co-morbidities.  I recommend he continue current medications.  In addition they were instructed to follow a low sodium diet with regular exercise to help to maintain adequate control of blood pressure.    Type 2 diabetes mellitus with hyperglycemia, with long-term current use of insulin ([place]) History of poorly controlled diabetes.  Update a1c today and will adjust medications based on results.  Counseled on healthy, low carb diet and recommend frequent activity to help with maintaining good control of blood sugars.    History of CVA (cerebrovascular accident) Previously on aggrenox, has discontinued due to running out of medication.  He does not want to restart aggrenox and would prefer to take asa only.   Continue statin.    Eczema Rash on legs and elbow consistent with eczematous type rash.  Trial of triamcinolone cream to area BID.  He will let me know if not improving.    Meds ordered this encounter   Medications  . triamcinolone cream (KENALOG) 0.1 %    Sig: Apply 1 application topically 2 (two) times daily.    Dispense:  80 g    Refill:  0    Return in about 3 months (around 04/18/2020) for HTN/DM.    This visit occurred during the SARS-CoV-2 public health emergency.  Safety protocols were in place, including screening questions prior to the visit, additional usage of staff PPE, and extensive cleaning of exam room while observing appropriate contact time as indicated for disinfecting solutions.

## 2020-01-18 NOTE — Assessment & Plan Note (Signed)
Blood pressure is at goal at for age and co-morbidities.  I recommend he continue current medications.  In addition they were instructed to follow a low sodium diet with regular exercise to help to maintain adequate control of blood pressure.   

## 2020-01-18 NOTE — Assessment & Plan Note (Signed)
History of poorly controlled diabetes.  Update a1c today and will adjust medications based on results.  Counseled on healthy, low carb diet and recommend frequent activity to help with maintaining good control of blood sugars.

## 2020-01-18 NOTE — Assessment & Plan Note (Signed)
Rash on legs and elbow consistent with eczematous type rash.  Trial of triamcinolone cream to area BID.  He will let me know if not improving.

## 2020-01-18 NOTE — Patient Instructions (Signed)
Great to see you! You can use cream on legs and elbow area.  Have labs completed today.  Follow up with me in about 3 months.

## 2020-01-19 LAB — LIPID PANEL
Cholesterol: 130 mg/dL (ref <200)
HDL: 39 mg/dL — ABNORMAL LOW (ref >=40)
LDL Cholesterol (Calc): 74 mg/dL (calc)
Non-HDL Cholesterol (Calc): 91 mg/dL (calc) (ref <130)
Total CHOL/HDL Ratio: 3.3 (calc) (ref <5.0)
Triglycerides: 88 mg/dL (ref <150)

## 2020-01-19 LAB — COMPLETE METABOLIC PANEL WITH GFR
AG Ratio: 1.3 (calc) (ref 1.0–2.5)
ALT: 17 U/L (ref 9–46)
AST: 14 U/L (ref 10–35)
Albumin: 4 g/dL (ref 3.6–5.1)
Alkaline phosphatase (APISO): 118 U/L (ref 35–144)
BUN: 11 mg/dL (ref 7–25)
CO2: 26 mmol/L (ref 20–32)
Calcium: 9.3 mg/dL (ref 8.6–10.3)
Chloride: 106 mmol/L (ref 98–110)
Creat: 0.93 mg/dL (ref 0.70–1.25)
GFR, Est African American: 102 mL/min/{1.73_m2} (ref >=60)
GFR, Est Non African American: 88 mL/min/{1.73_m2} (ref >=60)
Globulin: 3.2 g/dL (calc) (ref 1.9–3.7)
Glucose, Bld: 113 mg/dL — ABNORMAL HIGH (ref 65–99)
Potassium: 3.9 mmol/L (ref 3.5–5.3)
Sodium: 139 mmol/L (ref 135–146)
Total Bilirubin: 0.6 mg/dL (ref 0.2–1.2)
Total Protein: 7.2 g/dL (ref 6.1–8.1)

## 2020-01-19 LAB — CBC
HCT: 44.1 % (ref 38.5–50.0)
Hemoglobin: 14.8 g/dL (ref 13.2–17.1)
MCH: 29.6 pg (ref 27.0–33.0)
MCHC: 33.6 g/dL (ref 32.0–36.0)
MCV: 88.2 fL (ref 80.0–100.0)
MPV: 12.6 fL — ABNORMAL HIGH (ref 7.5–12.5)
Platelets: 191 10*3/uL (ref 140–400)
RBC: 5 10*6/uL (ref 4.20–5.80)
RDW: 14.2 % (ref 11.0–15.0)
WBC: 8.6 10*3/uL (ref 3.8–10.8)

## 2020-01-19 LAB — HEMOGLOBIN A1C
Hgb A1c MFr Bld: 10.2 % of total Hgb — ABNORMAL HIGH (ref <5.7)
Mean Plasma Glucose: 246 (calc)
eAG (mmol/L): 13.6 (calc)

## 2020-02-11 ENCOUNTER — Other Ambulatory Visit: Payer: Self-pay | Admitting: Family Medicine

## 2020-02-14 NOTE — Telephone Encounter (Signed)
Last OV 01/18/20 Last fill 05/14/19  #78mL/3

## 2020-02-22 ENCOUNTER — Other Ambulatory Visit: Payer: Self-pay | Admitting: Family Medicine

## 2020-02-22 DIAGNOSIS — Z794 Long term (current) use of insulin: Secondary | ICD-10-CM

## 2020-02-22 NOTE — Telephone Encounter (Signed)
CM-Plz see refill req/thx dmf 

## 2020-02-24 NOTE — Telephone Encounter (Signed)
CM-Plz see refill req/thx dmf 

## 2020-03-02 ENCOUNTER — Other Ambulatory Visit: Payer: Self-pay | Admitting: Family Medicine

## 2020-03-02 NOTE — Telephone Encounter (Signed)
CM-Plz see refill req/thx dmf 

## 2020-03-07 ENCOUNTER — Other Ambulatory Visit: Payer: Self-pay | Admitting: Family Medicine

## 2020-03-07 NOTE — Telephone Encounter (Signed)
CM-Plz see refill req/thx dmf 

## 2020-04-18 ENCOUNTER — Other Ambulatory Visit: Payer: Self-pay

## 2020-04-18 ENCOUNTER — Ambulatory Visit (INDEPENDENT_AMBULATORY_CARE_PROVIDER_SITE_OTHER): Payer: Medicare Other | Admitting: Family Medicine

## 2020-04-18 ENCOUNTER — Encounter: Payer: Self-pay | Admitting: Family Medicine

## 2020-04-18 VITALS — BP 152/80 | HR 74 | Ht 70.08 in | Wt 213.4 lb

## 2020-04-18 DIAGNOSIS — M5412 Radiculopathy, cervical region: Secondary | ICD-10-CM | POA: Insufficient documentation

## 2020-04-18 DIAGNOSIS — Z794 Long term (current) use of insulin: Secondary | ICD-10-CM | POA: Diagnosis not present

## 2020-04-18 DIAGNOSIS — F17210 Nicotine dependence, cigarettes, uncomplicated: Secondary | ICD-10-CM | POA: Diagnosis not present

## 2020-04-18 DIAGNOSIS — E1165 Type 2 diabetes mellitus with hyperglycemia: Secondary | ICD-10-CM | POA: Diagnosis not present

## 2020-04-18 DIAGNOSIS — E782 Mixed hyperlipidemia: Secondary | ICD-10-CM

## 2020-04-18 DIAGNOSIS — I10 Essential (primary) hypertension: Secondary | ICD-10-CM

## 2020-04-18 DIAGNOSIS — Z8673 Personal history of transient ischemic attack (TIA), and cerebral infarction without residual deficits: Secondary | ICD-10-CM

## 2020-04-18 LAB — POCT GLYCOSYLATED HEMOGLOBIN (HGB A1C): Hemoglobin A1C: 9.4 % — AB (ref 4.0–5.6)

## 2020-04-18 MED ORDER — OZEMPIC (0.25 OR 0.5 MG/DOSE) 2 MG/1.5ML ~~LOC~~ SOPN: PEN_INJECTOR | SUBCUTANEOUS | 1 refills | Status: DC

## 2020-04-18 MED ORDER — LISINOPRIL 20 MG PO TABS: 20.0000 mg | ORAL_TABLET | Freq: Every day | ORAL | 1 refills | Status: DC

## 2020-04-18 MED ORDER — GABAPENTIN 300 MG PO CAPS: 300.0000 mg | ORAL_CAPSULE | Freq: Three times a day (TID) | ORAL | 3 refills | Status: DC

## 2020-04-18 NOTE — Assessment & Plan Note (Signed)
Most recent A1c of  Lab Results  Component Value Date   HGBA1C 9.4 (A) 04/18/2020   indicates diabetes is not well controlled, but improved from last time.  He  will start ozempic in addition to lantus/humalog insulin.  Counseled on healthy, low carb diet and recommend frequent activity to help with maintaining good control of blood sugars.

## 2020-04-18 NOTE — Assessment & Plan Note (Signed)
Tolerating atorvastatin well, continue at current dosing.  Lab Results  Component Value Date   LDLCALC 74 01/18/2020

## 2020-04-18 NOTE — Assessment & Plan Note (Signed)
Counseled on smoking cessation and referral entered for lung cancer screening eval.

## 2020-04-18 NOTE — Assessment & Plan Note (Signed)
Blood pressure is not at goal at for age and co-morbidities.  I recommend he increase lisinopril to 20mg  daily.  In addition they were instructed to follow a low sodium diet with regular exercise to help to maintain adequate control of blood pressure.

## 2020-04-18 NOTE — Assessment & Plan Note (Signed)
Continue on ASA and statin.  Keep BP well controlled.

## 2020-04-18 NOTE — Patient Instructions (Addendum)
Continue insulin at current dose.  Limit high carb foods and increase exercise.   I have added Ozempic to help with your blood sugars. You may have some nausea with this as first but should improve.   I have entered referral for lung cancer screening.  I would recommend quitting smoking as well.   Increase lisinopril to 20mg  for blood pressure.   See me again in 3 months.

## 2020-04-18 NOTE — Progress Notes (Signed)
Erik Rose - 64 y.o. male MRN [id number]  Date of birth: 1956-04-28  Subjective Chief Complaint  Patient presents with  . Hypertension  . Diabetes    HPI Erik Rose is a 64 y.o. male with history of HTN, T2DM and previous stroke here today for for follow up.   -HTN:  Treated with lisinopril and metoprolol.  He is taking medications as directed.  He denies chest pain,shortness of breath, palpitations, headache or vision changes.   -T2DM:  Current management with lantus 60 units daily and humalog 15 units with each meal.  He is using as directed.  He denies episodes of hypoglycemia.  He is checking a few times per week with averages around 150.    -HLD:  Current management with atorvastatin.  He is tolerating well and denies myalgias related to this.    He is also having some pain from neck with radiation into R arm.  He has associated pins and needle sensation with this.  He denies weakness of the hand.    He would also like to have lung cancer screening completed.  Reports smoking 1/2 ppd for 40 years.  Denies symptoms.   ROS:  A comprehensive ROS was completed and negative except as noted per HPI   No Known Allergies  Past Medical History:  Diagnosis Date  . Diabetes mellitus without complication (HCC)   . Diverticulitis   . Heart disease   . Hypertension   . Stroke [facility])     Past Surgical History:  Procedure Laterality Date  . CARDIAC CATHETERIZATION    . CORONARY ANGIOPLASTY WITH STENT PLACEMENT      Social History   Socioeconomic History  . Marital status: Legally Separated    Spouse name: Not on file  . Number of children: Not on file  . Years of education: Not on file  . Highest education level: Not on file  Occupational History  . Not on file  Tobacco Use  . Smoking status: Current Some Day Smoker  . Smokeless tobacco: Never Used  Vaping Use  . Vaping Use: Never used  Substance and Sexual Activity  . Alcohol use: Never  . Drug use:  Never  . Sexual activity: Yes  Other Topics Concern  . Not on file  Social History Narrative  . Not on file   Social Determinants of Health   Financial Resource Strain:   . Difficulty of Paying Living Expenses:   Food Insecurity:   . Worried About Programme researcher, broadcasting/film/video in the Last Year:   . Barista in the Last Year:   Transportation Needs:   . Freight forwarder (Medical):   Marland Kitchen Lack of Transportation (Non-Medical):   Physical Activity:   . Days of Exercise per Week:   . Minutes of Exercise per Session:   Stress:   . Feeling of Stress :   Social Connections:   . Frequency of Communication with Friends and Family:   . Frequency of Social Gatherings with Friends and Family:   . Attends Religious Services:   . Active Member of Clubs or Organizations:   . Attends Banker Meetings:   Marland Kitchen Marital Status:     Family History  Problem Relation Age of Onset  . Hypertension Mother   . Kidney disease Mother   . Miscarriages / [place] Mother   . Stroke Mother   . Heart disease Father   . Depression Sister   . Kidney disease Sister   .  Diabetes Brother   . Hypertension Brother   . Hyperlipidemia Brother   . Kidney disease Brother     Health Maintenance  Topic Date Due  . Hepatitis C Screening  Never done  . FOOT EXAM  Never done  . OPHTHALMOLOGY EXAM  Never done  . TETANUS/TDAP  Never done  . COLONOSCOPY  01/17/2019  . INFLUENZA VACCINE  05/07/2020  . HEMOGLOBIN A1C  10/19/2020  . PNEUMOCOCCAL POLYSACCHARIDE VACCINE AGE 63-64 HIGH RISK  Completed  . COVID-19 Vaccine  Completed  . HIV Screening  Completed     ----------------------------------------------------------------------------------------------------------------------------------------------------------------------------------------------------------------- Physical Exam BP (!) 152/80 (BP Location: Left Arm, Patient Position: Sitting, Cuff Size: Large)   Pulse 74   Ht 5' 10.08" (1.78 m)    Wt 213 lb 6.4 oz (96.8 kg)   SpO2 100%   BMI 30.55 kg/m   Physical Exam Constitutional:      Appearance: Normal appearance.  HENT:     Head: Normocephalic and atraumatic.  Eyes:     General: No scleral icterus. Cardiovascular:     Rate and Rhythm: Normal rate and regular rhythm.  Pulmonary:     Effort: Pulmonary effort is normal.     Breath sounds: Normal breath sounds.  Musculoskeletal:     Cervical back: Normal range of motion.  Neurological:     General: No focal deficit present.     Mental Status: He is alert and oriented to person, place, and time.  Psychiatric:        Mood and Affect: Mood normal.     ------------------------------------------------------------------------------------------------------------------------------------------------------------------------------------------------------------------- Assessment and Plan  Mixed hyperlipidemia Tolerating atorvastatin well, continue at current dosing.  Lab Results  Component Value Date   LDLCALC 74 01/18/2020     History of CVA (cerebrovascular accident) Continue on ASA and statin.  Keep BP well controlled.   Type 2 diabetes mellitus with hyperglycemia, with long-term current use of insulin (HCC) Most recent A1c of  Lab Results  Component Value Date   HGBA1C 9.4 (A) 04/18/2020   indicates diabetes is not well controlled, but improved from last time.  He  will start ozempic in addition to lantus/humalog insulin.  Counseled on healthy, low carb diet and recommend frequent activity to help with maintaining good control of blood sugars.    Essential hypertension Blood pressure is not at goal at for age and co-morbidities.  I recommend he increase lisinopril to 20mg  daily.  In addition they were instructed to follow a low sodium diet with regular exercise to help to maintain adequate control of blood pressure.    Smoking greater than 20 pack years Counseled on smoking cessation and referral entered for  lung cancer screening eval.   Cervical radiculopathy Adding gabapentin Xrays of cervical spine ordered.    Meds ordered this encounter  Medications  . Semaglutide,0.25 or 0.5MG /DOS, (OZEMPIC, 0.25 OR 0.5 MG/DOSE,) 2 MG/1.5ML SOPN    Sig: Start 0.25mg  weekly x4 weeks then increase to 0.5mg  weekly.    Dispense:  4 pen    Refill:  1  . lisinopril (ZESTRIL) 20 MG tablet    Sig: Take 1 tablet (20 mg total) by mouth daily.    Dispense:  90 tablet    Refill:  1    Return in about 3 months (around 07/19/2020) for HTN/DM.    This visit occurred during the SARS-CoV-2 public health emergency.  Safety protocols were in place, including screening questions prior to the visit, additional usage of staff PPE, and extensive cleaning of  exam room while observing appropriate contact time as indicated for disinfecting solutions.

## 2020-04-18 NOTE — Assessment & Plan Note (Signed)
Adding gabapentin Xrays of cervical spine ordered.

## 2020-04-25 ENCOUNTER — Telehealth: Payer: Self-pay

## 2020-04-25 NOTE — Telephone Encounter (Signed)
Advised pt, patient assistance documentation has been completed by Dr. Ashley Royalty.   Docs faxed to [employer] @ [phone].  Copy sent to scan.

## 2020-05-03 ENCOUNTER — Other Ambulatory Visit: Payer: Self-pay | Admitting: *Deleted

## 2020-05-03 DIAGNOSIS — F17210 Nicotine dependence, cigarettes, uncomplicated: Secondary | ICD-10-CM

## 2020-05-03 DIAGNOSIS — Z87891 Personal history of nicotine dependence: Secondary | ICD-10-CM

## 2020-05-24 ENCOUNTER — Encounter: Payer: Self-pay | Admitting: Acute Care

## 2020-05-24 ENCOUNTER — Other Ambulatory Visit: Payer: Self-pay

## 2020-05-24 ENCOUNTER — Ambulatory Visit (INDEPENDENT_AMBULATORY_CARE_PROVIDER_SITE_OTHER): Payer: Medicare Other | Admitting: Acute Care

## 2020-05-24 ENCOUNTER — Ambulatory Visit
Admission: RE | Admit: 2020-05-24 | Discharge: 2020-05-24 | Disposition: A | Discharge status: Home / Self Care | Payer: Medicare Other | Source: Ambulatory Visit | Attending: Acute Care | Admitting: Acute Care

## 2020-05-24 VITALS — BP 132/78 | HR 98 | Temp 96.9°F | Ht 70.0 in | Wt 212.8 lb

## 2020-05-24 DIAGNOSIS — F17210 Nicotine dependence, cigarettes, uncomplicated: Secondary | ICD-10-CM

## 2020-05-24 DIAGNOSIS — Z87891 Personal history of nicotine dependence: Secondary | ICD-10-CM

## 2020-05-24 NOTE — Progress Notes (Signed)
Shared Decision Making Visit Lung Cancer Screening Program [phone])   Eligibility:  Age 64 y.o.  Pack Years Smoking History Calculation 30 pack year smoking history (# packs/per year x # years smoked)  Recent History of coughing up blood  no  Unexplained weight loss? no ( >Than 15 pounds within the last 6 months )  Prior History Lung / other cancer no (Diagnosis within the last 5 years already requiring surveillance chest CT Scans).  Smoking Status Current Smoker  Former Smokers: Years since quit: NA  Quit Date: NA  Visit Components:  Discussion included one or more decision making aids. yes  Discussion included risk/benefits of screening. yes  Discussion included potential follow up diagnostic testing for abnormal scans. yes  Discussion included meaning and risk of over diagnosis. yes  Discussion included meaning and risk of False Positives. yes  Discussion included meaning of total radiation exposure. yes  Counseling Included:  Importance of adherence to annual lung cancer LDCT screening. yes  Impact of comorbidities on ability to participate in the program. yes  Ability and willingness to under diagnostic treatment. yes  Smoking Cessation Counseling:  Current Smokers:   Discussed importance of smoking cessation. yes  Information about tobacco cessation classes and interventions provided to patient. yes  Patient provided with "ticket" for LDCT Scan. yes  Symptomatic Patient. no  Counseling  Diagnosis Code: Tobacco Use Z72.0  Asymptomatic Patient yes  Counseling (Intermediate counseling: > three minutes counseling) J6283  Former Smokers:   Discussed the importance of maintaining cigarette abstinence. yes  Diagnosis Code: Personal History of Nicotine Dependence. M62.947  Information about tobacco cessation classes and interventions provided to patient. Yes  Patient provided with "ticket" for LDCT Scan. yes  Written Order for Lung Cancer  Screening with LDCT placed in Epic. Yes (CT Chest Lung Cancer Screening Low Dose W/O CM) [id number] Z12.2-Screening of respiratory organs Z87.891-Personal history of nicotine dependence  I have spent 25 minutes of face to face time with Erik Rose discussing the risks and benefits of lung cancer screening. We viewed a power point together that explained in detail the above noted topics. We paused at intervals to allow for questions to be asked and answered to ensure understanding.We discussed that the single most powerful action that he can take to decrease his risk of developing lung cancer is to quit smoking. We discussed whether or not he is ready to commit to setting a quit date. We discussed options for tools to aid in quitting smoking including nicotine replacement therapy, non-nicotine medications, support groups, Quit Smart classes, and behavior modification. We discussed that often times setting smaller, more achievable goals, such as eliminating 1 cigarette a day for a week and then 2 cigarettes a day for a week can be helpful in slowly decreasing the number of cigarettes smoked. This allows for a sense of accomplishment as well as providing a clinical benefit. I gave him the " Be Stronger Than Your Excuses" card with contact information for community resources, classes, free nicotine replacement therapy, and access to mobile apps, text messaging, and on-line smoking cessation help. I have also given him my card and contact information in the event he needs to contact me. We discussed the time and location of the scan, and that either [name] RN or I will call with the results within 24-48 hours of receiving them. I have offered him  a copy of the power point we viewed  as a resource in the event they need reinforcement of  the concepts we discussed today in the office. The patient verbalized understanding of all of  the above and had no further questions upon leaving the office. They have my  contact information in the event they have any further questions.  I spent 4 minutes counseling on smoking cessation and the health risks of continued tobacco abuse.  I explained to the patient that there has been a high incidence of coronary artery disease noted on these exams. I explained that this is a non-gated exam therefore degree or severity cannot be determined. This patient is on statin therapy. I have asked the patient to follow-up with their PCP regarding any incidental finding of coronary artery disease and management with diet or medication as their PCP  feels is clinically indicated. The patient verbalized understanding of the above and had no further questions upon completion of the visit.      Bevelyn Ngo, NP 05/24/2020 12:09 PM

## 2020-05-24 NOTE — Patient Instructions (Signed)
Thank you for participating in the [facility] Cancer Screening Program. It was our pleasure to meet you today. We will call you with the results of your scan within the next few days. Your scan will be assigned a Lung RADS category score by the physicians reading the scans.  This Lung RADS score determines follow up scanning.  See below for description of categories, and follow up screening recommendations. We will be in touch to schedule your follow up screening annually or based on recommendations of our providers. We will fax a copy of your scan results to your Primary Care Physician, or the physician who referred you to the program, to ensure they have the results. Please call the office if you have any questions or concerns regarding your scanning experience or results.  Our office number is [phone]. Please speak with Denise Phelps, RN. She is our Lung Cancer Screening RN. If she is unavailable when you call, please have the office staff send her a message. She will return your call at her earliest convenience. Remember, if your scan is normal, we will scan you annually as long as you continue to meet the criteria for the program. (Age 55-77, Current smoker or smoker who has quit within the last 15 years). If you are a smoker, remember, quitting is the single most powerful action that you can take to decrease your risk of lung cancer and other pulmonary, breathing related problems. We know quitting is hard, and we are here to help.  Please let us know if there is anything we can do to help you meet your goal of quitting. If you are a former smoker, congratulations. We are proud of you! Remain smoke free! Remember you can refer friends or family members through the number above.  We will screen them to make sure they meet criteria for the program. Thank you for helping us take better care of you by participating in Lung Screening.  Lung RADS Categories:  Lung RADS 1: no nodules  or definitely non-concerning nodules.  Recommendation is for a repeat annual scan in 12 months.  Lung RADS 2:  nodules that are non-concerning in appearance and behavior with a very low likelihood of becoming an active cancer. Recommendation is for a repeat annual scan in 12 months.  Lung RADS 3: nodules that are probably non-concerning , includes nodules with a low likelihood of becoming an active cancer.  Recommendation is for a 6-month repeat screening scan. Often noted after an upper respiratory illness. We will be in touch to make sure you have no questions, and to schedule your 6-month scan.  Lung RADS 4 A: nodules with concerning findings, recommendation is most often for a follow up scan in 3 months or additional testing based on our provider's assessment of the scan. We will be in touch to make sure you have no questions and to schedule the recommended 3 month follow up scan.  Lung RADS 4 B:  indicates findings that are concerning. We will be in touch with you to schedule additional diagnostic testing based on our provider's  assessment of the scan.   

## 2020-05-25 NOTE — Progress Notes (Signed)
Please call patient and let them  know their  low dose Ct was read as a Lung RADS 2: nodules that are benign in appearance and behavior with a very low likelihood of becoming a clinically active cancer due to size or lack of growth. Recommendation per radiology is for a repeat LDCT in 12 months. .Please let them  know we will order and schedule their  annual screening scan for 05/2021 Please let them  know there was notation of CAD on their  scan.  Please remind the patient  that this is a non-gated exam therefore degree or severity of disease  cannot be determined. Please have them  follow up with their PCP regarding potential risk factor modification, dietary therapy or pharmacologic therapy if clinically indicated. Pt.  Is currently on statin therapy. Please place order for annual  screening scan for  05/2021 and fax results to PCP. Thanks so much.

## 2020-05-29 ENCOUNTER — Other Ambulatory Visit: Payer: Self-pay | Admitting: *Deleted

## 2020-05-29 DIAGNOSIS — F17210 Nicotine dependence, cigarettes, uncomplicated: Secondary | ICD-10-CM

## 2020-05-29 DIAGNOSIS — Z87891 Personal history of nicotine dependence: Secondary | ICD-10-CM

## 2020-06-05 ENCOUNTER — Other Ambulatory Visit: Payer: Self-pay | Admitting: Family Medicine

## 2020-06-05 DIAGNOSIS — E119 Type 2 diabetes mellitus without complications: Secondary | ICD-10-CM

## 2020-06-05 DIAGNOSIS — Z794 Long term (current) use of insulin: Secondary | ICD-10-CM

## 2020-06-27 ENCOUNTER — Telehealth: Payer: Self-pay

## 2020-06-27 NOTE — Telephone Encounter (Signed)
PAP Ozempic has arrived and is in the fridge (labeled).  Contacted patient for pick-up on 06/28/20.

## 2020-07-16 ENCOUNTER — Encounter: Payer: Self-pay | Admitting: *Deleted

## 2020-07-16 ENCOUNTER — Other Ambulatory Visit: Payer: Self-pay

## 2020-07-16 ENCOUNTER — Ambulatory Visit
Admission: EM | Admit: 2020-07-16 | Discharge: 2020-07-16 | Disposition: A | Discharge status: Home / Self Care | Payer: Medicare Other

## 2020-07-16 DIAGNOSIS — R519 Headache, unspecified: Secondary | ICD-10-CM | POA: Diagnosis not present

## 2020-07-16 DIAGNOSIS — M791 Myalgia, unspecified site: Secondary | ICD-10-CM | POA: Diagnosis not present

## 2020-07-16 DIAGNOSIS — R509 Fever, unspecified: Secondary | ICD-10-CM

## 2020-07-16 HISTORY — DX: Gastro-esophageal reflux disease without esophagitis: K21.9

## 2020-07-16 HISTORY — DX: Transient cerebral ischemic attack, unspecified: G45.9

## 2020-07-16 NOTE — Discharge Instructions (Addendum)
Your COVID test is pending - it is important to quarantine / isolate at home until your results are back. °If you test positive and would like further evaluation for persistent or worsening symptoms, you may schedule an E-visit or virtual (video) visit throughout the [facility] MyChart app or website. ° °PLEASE NOTE: If you develop severe chest pain or shortness of breath please go to the ER or call 9-1-1 for further evaluation --> DO NOT schedule electronic or virtual visits for this. °Please call our office for further guidance / recommendations as needed. ° °For information about the Covid vaccine, please visit [email or web address] °

## 2020-07-16 NOTE — ED Triage Notes (Signed)
C/O HA, body aches, fever onset 2 days ago.  Temps up to 100.9 at home.  Denies any congestion, runny nose, cough, sore throat, n/v/d.

## 2020-07-16 NOTE — ED Provider Notes (Signed)
[facility]    CSN: [id number] Arrival date & time: 07/16/20  1020      History   Chief Complaint Chief Complaint  Patient presents with  . Fever  . Generalized Body Aches    HPI Erik Rose is a 64 y.o. male  Presenting for Covid testing.  Endorsing mild, frontal headache, myalgias, fever since 2 days ago.  T-max 101.75F, none today.  Has taken Tylenol with relief of headaches and fever.  Denies nasal congestion, runny nose, cough, sore throat, difficulty breathing, GI symptoms.  States his daughter is a Marine scientist and wanted him to get tested.  No known contacts and patient is Covid vaccinated.  Past Medical History:  Diagnosis Date  . Diabetes mellitus without complication ([place])   . Diverticulitis   . GERD (gastroesophageal reflux disease)   . Heart disease   . Hypertension   . Stroke ([place])   . TIA (transient ischemic attack)     Patient Active Problem List   Diagnosis Date Noted  . Cervical radiculopathy 04/18/2020  . Smoking greater than 20 pack years 04/18/2020  . Eczema 01/18/2020  . Colon cancer screening 12/09/2018  . Essential hypertension 09/09/2018  . Type 2 diabetes mellitus with hyperglycemia, with long-term current use of insulin ([place]) 09/09/2018  . CAD (coronary artery disease), native coronary artery 09/09/2018  . History of CVA (cerebrovascular accident) 09/09/2018  . Mixed hyperlipidemia 02/05/2018    Past Surgical History:  Procedure Laterality Date  . CARDIAC CATHETERIZATION    . CORONARY ANGIOPLASTY WITH STENT PLACEMENT         Home Medications    Prior to Admission medications   Medication Sig Start Date End Date Taking? Authorizing Provider  atorvastatin (LIPITOR) 80 MG tablet TAKE 1 TABLET BY MOUTH ONCE DAILY 09/06/19  Yes [name], DO  Esomeprazole Magnesium (NEXIUM PO) Take by mouth.   Yes [provider]  HUMALOG KWIKPEN 100 UNIT/ML KwikPen INJECT 15 UNITS INTO THE SKIN 3 TIMES DAILY 02/14/20  Yes  [name], DO  LANTUS SOLOSTAR 100 UNIT/ML Solostar Pen ADMINISTER 55 UNITS UNDER THE SKIN DAILY 03/08/20  Yes [name], DO  lisinopril (ZESTRIL) 20 MG tablet Take 1 tablet (20 mg total) by mouth daily. 04/18/20  Yes [name], DO  metoprolol tartrate (LOPRESSOR) 25 MG tablet TAKE 1 TABLET BY MOUTH 2 TIMES DAILY 02/22/20  Yes [name], DO  Semaglutide,0.25 or 0.5MG/DOS, (OZEMPIC, 0.25 OR 0.5 MG/DOSE,) 2 MG/1.5ML SOPN Start 0.57m weekly x4 weeks then increase to 0.542mweekly. 04/18/20  Yes MaLuetta NuttingDO  Blood Glucose Monitoring Suppl (ONE TOUCH ULTRA 2) w/Device KIT Use to take blood sugar one to two times daily 09/18/18   MaLuetta NuttingDO  gabapentin (NEURONTIN) 300 MG capsule Take 1 capsule (300 mg total) by mouth 3 (three) times daily. For nerve pain 04/18/20   MaLuetta NuttingDO  [facility] test strip USE TO CHECK BLOOD SUGAR TWICE DAILY 06/06/20   MaLuetta NuttingDO  triamcinolone cream (KENALOG) 0.1 % Apply 1 application topically 2 (two) times daily. 01/18/20   MaLuetta NuttingDO    Family History Family History  Problem Relation Age of Onset  . Hypertension Mother   . Kidney disease Mother   . Miscarriages / [place]   . Stroke Mother   . Heart disease Father   . Depression Sister   . Kidney disease Sister   . Diabetes Brother   . Hypertension Brother   . Hyperlipidemia Brother   .  Kidney disease Brother     Social History Social History   Tobacco Use  . Smoking status: Current Some Day Smoker    Packs/day: 0.50  . Smokeless tobacco: Never Used  Vaping Use  . Vaping Use: Never used  Substance Use Topics  . Alcohol use: Never  . Drug use: Never     Allergies   Patient has no known allergies.   Review of Systems As per HPI   Physical Exam Triage Vital Signs ED Triage Vitals  Enc Vitals Group     BP      Pulse      Resp      Temp      Temp src      SpO2      Weight      Height      Head Circumference      Peak  Flow      Pain Score      Pain Loc      Pain Edu?      Excl. in [place]?    No data found.  Updated Vital Signs BP (!) 154/97 Comment: has not taken HTN med today  Pulse 92   Temp 98.2 F (36.8 C) (Temporal)   Resp 18   SpO2 98%   Visual Acuity Right Eye Distance:   Left Eye Distance:   Bilateral Distance:    Right Eye Near:   Left Eye Near:    Bilateral Near:     Physical Exam Constitutional:      General: He is not in acute distress.    Appearance: He is not toxic-appearing or diaphoretic.  HENT:     Head: Normocephalic and atraumatic.     Mouth/Throat:     Mouth: Mucous membranes are moist.     Pharynx: Oropharynx is clear.  Eyes:     General: No scleral icterus.    Conjunctiva/sclera: Conjunctivae normal.     Pupils: Pupils are equal, round, and reactive to light.  Neck:     Comments: Trachea midline, negative JVD Cardiovascular:     Rate and Rhythm: Normal rate and regular rhythm.  Pulmonary:     Effort: Pulmonary effort is normal. No respiratory distress.     Breath sounds: No wheezing.  Musculoskeletal:     Cervical back: Neck supple. No tenderness.  Lymphadenopathy:     Cervical: No cervical adenopathy.  Skin:    Capillary Refill: Capillary refill takes less than 2 seconds.     Coloration: Skin is not jaundiced or pale.     Findings: No rash.  Neurological:     Mental Status: He is alert and oriented to person, place, and time.      UC Treatments / Results  Labs (all labs ordered are listed, but only abnormal results are displayed) Labs Reviewed  NOVEL CORONAVIRUS, NAA    EKG   Radiology No results found.  Procedures Procedures (including critical care time)  Medications Ordered in UC Medications - No data to display  Initial Impression / Assessment and Plan / UC Course  I have reviewed the triage vital signs and the nursing notes.  Pertinent labs & imaging results that were available during my care of the patient were reviewed by me  and considered in my medical decision making (see chart for details).     Patient afebrile, nontoxic, with SpO2 98%.  Covid PCR pending.  Patient to quarantine until results are back.  We will treat supportively as outlined  below.  Return precautions discussed, patient verbalized understanding and is agreeable to plan. Final Clinical Impressions(s) / UC Diagnoses   Final diagnoses:  Fever, unspecified  Frontal headache  Myalgia     Discharge Instructions     Your COVID test is pending - it is important to quarantine / isolate at home until your results are back. If you test positive and would like further evaluation for persistent or worsening symptoms, you may schedule an E-visit or virtual (video) visit throughout the [facility] app or website.  PLEASE NOTE: If you develop severe chest pain or shortness of breath please go to the ER or call 9-1-1 for further evaluation --> DO NOT schedule electronic or virtual visits for this. Please call our office for further guidance / recommendations as needed.  For information about the Covid vaccine, please visit [email or web address]    ED Prescriptions    None     PDMP not reviewed this encounter.   Hall[place], [place], [place] 07/16/20 1119

## 2020-07-17 LAB — NOVEL CORONAVIRUS, NAA: SARS-CoV-2, NAA: NOT DETECTED

## 2020-07-17 LAB — SARS-COV-2, NAA 2 DAY TAT

## 2020-07-19 ENCOUNTER — Encounter: Payer: Self-pay | Admitting: Family Medicine

## 2020-07-19 ENCOUNTER — Ambulatory Visit (INDEPENDENT_AMBULATORY_CARE_PROVIDER_SITE_OTHER): Payer: Medicare Other | Admitting: Family Medicine

## 2020-07-19 VITALS — BP 138/88 | HR 99 | Temp 98.2°F | Wt 208.0 lb

## 2020-07-19 DIAGNOSIS — Z23 Encounter for immunization: Secondary | ICD-10-CM | POA: Diagnosis not present

## 2020-07-19 DIAGNOSIS — I2510 Atherosclerotic heart disease of native coronary artery without angina pectoris: Secondary | ICD-10-CM | POA: Diagnosis not present

## 2020-07-19 DIAGNOSIS — E1165 Type 2 diabetes mellitus with hyperglycemia: Secondary | ICD-10-CM | POA: Diagnosis not present

## 2020-07-19 DIAGNOSIS — E782 Mixed hyperlipidemia: Secondary | ICD-10-CM

## 2020-07-19 DIAGNOSIS — Z794 Long term (current) use of insulin: Secondary | ICD-10-CM

## 2020-07-19 DIAGNOSIS — M5412 Radiculopathy, cervical region: Secondary | ICD-10-CM | POA: Diagnosis not present

## 2020-07-19 DIAGNOSIS — I10 Essential (primary) hypertension: Secondary | ICD-10-CM

## 2020-07-19 LAB — POCT GLYCOSYLATED HEMOGLOBIN (HGB A1C): Hemoglobin A1C: 9.8 % — AB (ref 4.0–5.6)

## 2020-07-19 MED ORDER — ATORVASTATIN CALCIUM 80 MG PO TABS
80.0000 mg | ORAL_TABLET | Freq: Every day | ORAL | 3 refills | Status: DC
Start: 2020-07-19 — End: 2021-08-15

## 2020-07-19 NOTE — Assessment & Plan Note (Signed)
Denies anginal symptoms.  Referral placed to cardiology per his request.

## 2020-07-19 NOTE — Patient Instructions (Addendum)
Restart Lantus, you should have refills remaining on this.  Pay attention to salt contained in food.  Limit sugary drinks.   See me again in 3 months.

## 2020-07-19 NOTE — Assessment & Plan Note (Signed)
BP improved, but discussed reduction in his sodium intake to help manage his BP as well.

## 2020-07-19 NOTE — Assessment & Plan Note (Signed)
Most recent A1c of  Lab Results  Component Value Date   HGBA1C 9.8 (A) 07/19/2020   indicates diabetes is not well controlled.  He recently started on Ozempic in addition to insulin.  Blood sugars have went form the 200's to the low 100's over the past few weeks.  Counseled on healthy, low carb diet and recommend frequent activity to help with maintaining good control of blood sugars.

## 2020-07-19 NOTE — Progress Notes (Signed)
Erik Rose - 64 y.o. male MRN [id number]  Date of birth: 23-May-1956  Subjective Chief Complaint  Patient presents with  . Hypertension  . Diabetes    HPI Erik Rose is a 64 y.o. male here today for follow up visit.  He has a history of T2DM, HTN, CAD and previous CVA.  -HTN:  Current treatment with lisijnopril and metoprolol.  Lisinopril increased at last visit due to uncontrolled HTN.  He reports that he is taking this increased dose.  He denies added salt but he is eating quite a bit of processed meats.  He has not had chest pain, shortness of breath, palpitations, headache or vision changes.  He does also have a history of CAD, which was also noted on recent CT for lung cancer screening and would like to establish with cardiology for this.    -T2DM:  Current management with Lantus 55 units daily, humalog 15 units tid.  Ozempic added at last visit as well but only recently received through patient assistance program.  He reports that he does drink a lot of vitamin waters.  He denies symptoms related to his diabetes at this time.   -Cervical radiculopathy:  He does also continue to have neck pain with radiation.  Previous referral for nerve conduction but was never scheduled.   ROS:  A comprehensive ROS was completed and negative except as noted per HPI   - No Known Allergies  Past Medical History:  Diagnosis Date  . Diabetes mellitus without complication (HCC)   . Diverticulitis   . GERD (gastroesophageal reflux disease)   . Heart disease   . Hypertension   . Stroke (HCC)   . TIA (transient ischemic attack)     Past Surgical History:  Procedure Laterality Date  . CARDIAC CATHETERIZATION    . CORONARY ANGIOPLASTY WITH STENT PLACEMENT      Social History   Socioeconomic History  . Marital status: Married    Spouse name: Not on file  . Number of children: Not on file  . Years of education: Not on file  . Highest education level: Not on file   Occupational History  . Not on file  Tobacco Use  . Smoking status: Current Some Day Smoker    Packs/day: 0.50  . Smokeless tobacco: Never Used  Vaping Use  . Vaping Use: Never used  Substance and Sexual Activity  . Alcohol use: Never  . Drug use: Never  . Sexual activity: Not on file  Other Topics Concern  . Not on file  Social History Narrative  . Not on file   Social Determinants of Health   Financial Resource Strain:   . Difficulty of Paying Living Expenses: Not on file  Food Insecurity:   . Worried About Programme researcher, broadcasting/film/video in the Last Year: Not on file  . Ran Out of Food in the Last Year: Not on file  Transportation Needs:   . Lack of Transportation (Medical): Not on file  . Lack of Transportation (Non-Medical): Not on file  Physical Activity:   . Days of Exercise per Week: Not on file  . Minutes of Exercise per Session: Not on file  Stress:   . Feeling of Stress : Not on file  Social Connections:   . Frequency of Communication with Friends and Family: Not on file  . Frequency of Social Gatherings with Friends and Family: Not on file  . Attends Religious Services: Not on file  . Active Member of Clubs or  Organizations: Not on file  . Attends Banker Meetings: Not on file  . Marital Status: Not on file    Family History  Problem Relation Age of Onset  . Hypertension Mother   . Kidney disease Mother   . Miscarriages / [place] Mother   . Stroke Mother   . Heart disease Father   . Depression Sister   . Kidney disease Sister   . Diabetes Brother   . Hypertension Brother   . Hyperlipidemia Brother   . Kidney disease Brother     Health Maintenance  Topic Date Due  . Hepatitis C Screening  Never done  . FOOT EXAM  Never done  . OPHTHALMOLOGY EXAM  Never done  . COLONOSCOPY  01/17/2019  . HEMOGLOBIN A1C  01/17/2021  . TETANUS/TDAP  07/19/2030  . INFLUENZA VACCINE  Completed  . PNEUMOCOCCAL POLYSACCHARIDE VACCINE AGE 82-64 HIGH RISK   Completed  . COVID-19 Vaccine  Completed  . HIV Screening  Completed     ----------------------------------------------------------------------------------------------------------------------------------------------------------------------------------------------------------------- Physical Exam BP 138/88 (BP Location: Left Arm, Patient Position: Sitting, Cuff Size: Large)   Pulse 99   Temp 98.2 F (36.8 C) (Oral)   Wt 208 lb (94.3 kg)   BMI 29.84 kg/m   Physical Exam Constitutional:      Appearance: Normal appearance.  HENT:     Head: Normocephalic and atraumatic.  Eyes:     General: No scleral icterus. Cardiovascular:     Rate and Rhythm: Normal rate and regular rhythm.  Pulmonary:     Effort: Pulmonary effort is normal.     Breath sounds: Normal breath sounds.  Neurological:     Mental Status: He is alert.  Psychiatric:        Mood and Affect: Mood normal.        Behavior: Behavior normal.     ------------------------------------------------------------------------------------------------------------------------------------------------------------------------------------------------------------------- Assessment and Plan  Essential hypertension BP improved, but discussed reduction in his sodium intake to help manage his BP as well.    CAD (coronary artery disease), native coronary artery Denies anginal symptoms.  Referral placed to cardiology per his request.    Type 2 diabetes mellitus with hyperglycemia, with long-term current use of insulin (HCC) Most recent A1c of  Lab Results  Component Value Date   HGBA1C 9.8 (A) 07/19/2020   indicates diabetes is not well controlled.  He recently started on Ozempic in addition to insulin.  Blood sugars have went form the 200's to the low 100's over the past few weeks.  Counseled on healthy, low carb diet and recommend frequent activity to help with maintaining good control of blood sugars.    Cervical  radiculopathy NCV/EMG study ordered.   Mixed hyperlipidemia He continues to do well with atorvastatin, continue at current strength.    Meds ordered this encounter  Medications  . atorvastatin (LIPITOR) 80 MG tablet    Sig: Take 1 tablet (80 mg total) by mouth daily.    Dispense:  90 tablet    Refill:  3   Orders Placed This Encounter  Procedures  . Flu Vaccine QUAD 6+ mos PF IM (Fluarix Quad PF)  . Tdap vaccine greater than or equal to 7yo IM  . Ambulatory referral to Cardiology    Referral Priority:   Routine    Referral Type:   Consultation    Referral Reason:   Specialty Services Required    Requested Specialty:   Cardiology    Number of Visits Requested:   1  . POCT  HgB A1C  . NCV with EMG(electromyography)    Standing Status:   Future    Standing Expiration Date:   07/19/2021    No follow-ups on file.    This visit occurred during the SARS-CoV-2 public health emergency.  Safety protocols were in place, including screening questions prior to the visit, additional usage of staff PPE, and extensive cleaning of exam room while observing appropriate contact time as indicated for disinfecting solutions.

## 2020-07-19 NOTE — Assessment & Plan Note (Signed)
NCV/EMG study ordered.

## 2020-07-19 NOTE — Assessment & Plan Note (Signed)
He continues to do well with atorvastatin, continue at current strength.
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
## 2020-08-10 ENCOUNTER — Ambulatory Visit: Payer: Medicare Other | Admitting: Cardiology

## 2020-08-10 ENCOUNTER — Encounter: Payer: Self-pay | Admitting: Cardiology

## 2020-08-10 ENCOUNTER — Other Ambulatory Visit: Payer: Self-pay

## 2020-08-10 VITALS — BP 158/90 | HR 95 | Ht 70.0 in | Wt 211.8 lb

## 2020-08-10 DIAGNOSIS — I739 Peripheral vascular disease, unspecified: Secondary | ICD-10-CM

## 2020-08-10 DIAGNOSIS — E1165 Type 2 diabetes mellitus with hyperglycemia: Secondary | ICD-10-CM | POA: Diagnosis not present

## 2020-08-10 DIAGNOSIS — E7800 Pure hypercholesterolemia, unspecified: Secondary | ICD-10-CM

## 2020-08-10 DIAGNOSIS — I10 Essential (primary) hypertension: Secondary | ICD-10-CM | POA: Diagnosis not present

## 2020-08-10 DIAGNOSIS — E782 Mixed hyperlipidemia: Secondary | ICD-10-CM | POA: Diagnosis not present

## 2020-08-10 DIAGNOSIS — I2510 Atherosclerotic heart disease of native coronary artery without angina pectoris: Secondary | ICD-10-CM

## 2020-08-10 DIAGNOSIS — Z794 Long term (current) use of insulin: Secondary | ICD-10-CM

## 2020-08-10 MED ORDER — CARVEDILOL 6.25 MG PO TABS: 6.2500 mg | ORAL_TABLET | Freq: Two times a day (BID) | ORAL | 3 refills | Status: DC

## 2020-08-10 MED ORDER — ASPIRIN EC 81 MG PO TBEC: 81.0000 mg | DELAYED_RELEASE_TABLET | Freq: Every day | ORAL | Status: AC

## 2020-08-10 NOTE — Patient Instructions (Addendum)
Medication Instructions:  Your physician has recommended you make the following change in your medication:  1-STOP metoprolol 2-START carvidolol 6.25 mg by mouth twice daily 3-START Aspirin 81 mg by mouth daily.  *If you need a refill on your cardiac medications before your next appointment, please call your pharmacy*  Lab Work: If you have labs (blood work) drawn today and your tests are completely normal, you will receive your results only by: Marland Kitchen MyChart Message (if you have MyChart) OR . A paper copy in the mail If you have any lab test that is abnormal or we need to change your treatment, we will call you to review the results.  Testing/Procedures: Your physician has requested that you have an echocardiogram. Echocardiography is a painless test that uses sound waves to create images of your heart. It provides your doctor with information about the size and shape of your heart and how well your heart's chambers and valves are working. This procedure takes approximately one hour. There are no restrictions for this procedure.  Your physician has requested that you have a lower extremity arterial duplex with ABI's. This test is an ultrasound of the arteries in the legs . It looks at arterial blood flow in the legs. Allow one hour for Lower Arterial scans. There are no restrictions or special instructions  Follow-Up: At [facility], you and your health needs are our priority.  As part of our continuing mission to provide you with exceptional heart care, we have created designated Provider Care Teams.  These Care Teams include your primary Cardiologist (physician) and Advanced Practice Providers (APPs -  Physician Assistants and Nurse Practitioners) who all work together to provide you with the care you need, when you need it.  We recommend signing up for the patient portal called "MyChart".  Sign up information is provided on this After Visit Summary.  MyChart is used to connect with patients  for Virtual Visits (Telemedicine).  Patients are able to view lab/test results, encounter notes, upcoming appointments, etc.  Non-urgent messages can be sent to your provider as well.   To learn more about what you can do with MyChart, go to [email or web address].    Your next appointment:   1 month(s)  The format for your next appointment:   In Person  Provider:   You may see Dr. Shari Prows or one of the following Advanced Practice Providers on your designated Care Team:    Tereso Newcomer, PA-C  Vin Bhagat, [place]    Smoking Tobacco Information, Adult Smoking tobacco can be harmful to your health. Tobacco contains a poisonous (toxic), colorless chemical called nicotine. Nicotine is addictive. It changes the brain and can make it hard to stop smoking. Tobacco also has other toxic chemicals that can hurt your body and raise your risk of many cancers. How can smoking tobacco affect me? Smoking tobacco puts you at risk for:  Cancer. Smoking is most commonly associated with lung cancer, but can also lead to cancer in other parts of the body.  Chronic obstructive pulmonary disease (COPD). This is a long-term lung condition that makes it hard to breathe. It also gets worse over time.  High blood pressure (hypertension), heart disease, stroke, or heart attack.  Lung infections, such as pneumonia.  Cataracts. This is when the lenses in the eyes become clouded.  Digestive problems. This may include peptic ulcers, heartburn, and gastroesophageal reflux disease (GERD).  Oral health problems, such as gum disease and tooth loss.  Loss of taste and  smell. Smoking can affect your appearance by causing:  Wrinkles.  Yellow or stained teeth, fingers, and fingernails. Smoking tobacco can also affect your social life, because:  It may be challenging to find places to smoke when away from home. Many workplaces, [employer], hotels, and public places are tobacco-free.  Smoking is expensive.  This is due to the cost of tobacco and the long-term costs of treating health problems from smoking.  Secondhand smoke may affect those around you. Secondhand smoke can cause lung cancer, breathing problems, and heart disease. Children of smokers have a higher risk for: ? Sudden infant death syndrome (SIDS). ? Ear infections. ? Lung infections. If you currently smoke tobacco, quitting now can help you:  Lead a longer and healthier life.  Look, smell, breathe, and feel better over time.  Save money.  Protect others from the harms of secondhand smoke. What actions can I take to prevent health problems? Quit smoking   Do not start smoking. Quit if you already do.  Make a plan to quit smoking and commit to it. Look for programs to help you and ask your health care provider for recommendations and ideas.  Set a date and write down all the reasons you want to quit.  Let your friends and family know you are quitting so they can help and support you. Consider finding friends who also want to quit. It can be easier to quit with someone else, so that you can support each other.  Talk with your health care provider about using nicotine replacement medicines to help you quit, such as gum, lozenges, patches, sprays, or pills.  Do not replace cigarette smoking with electronic cigarettes, which are commonly called e-cigarettes. The safety of e-cigarettes is not known, and some may contain harmful chemicals.  If you try to quit but return to smoking, stay positive. It is common to slip up when you first quit, so take it one day at a time.  Be prepared for cravings. When you feel the urge to smoke, chew gum or suck on hard candy. Lifestyle  Stay busy and take care of your body.  Drink enough fluid to keep your urine pale yellow.  Get plenty of exercise and eat a healthy diet. This can help prevent weight gain after quitting.  Monitor your eating habits. Quitting smoking can cause you to have  a larger appetite than when you smoke.  Find ways to relax. Go out with friends or family to a movie or a restaurant where people do not smoke.  Ask your health care provider about having regular tests (screenings) to check for cancer. This may include blood tests, imaging tests, and other tests.  Find ways to manage your stress, such as meditation, yoga, or exercise. Where to find support To get support to quit smoking, consider:  Asking your health care provider for more information and resources.  Taking classes to learn more about quitting smoking.  Looking for local organizations that offer resources about quitting smoking.  Joining a support group for people who want to quit smoking in your local community.  Calling the smokefree.gov counselor helpline: 1-800-Quit-Now (484) 762-1902) Where to find more information You may find more information about quitting smoking from:  HelpGuide.org: www.helpguide.org  BankRights[employer]: smokefree.gov  American Lung Association: www.lung.org Contact a health care provider if you:  Have problems breathing.  Notice that your lips, nose, or fingers turn blue.  Have chest pain.  Are coughing up blood.  Feel faint or you pass out.  Have other health changes that cause you to worry. Summary  Smoking tobacco can negatively affect your health, the health of those around you, your finances, and your social life.  Do not start smoking. Quit if you already do. If you need help quitting, ask your health care provider.  Think about joining a support group for people who want to quit smoking in your local community. There are many effective programs that will help you to quit this behavior. This information is not intended to replace advice given to you by your health care provider. Make sure you discuss any questions you have with your health care provider. Document Revised: 06/18/2019 Document Reviewed: 10/08/2016 Elsevier Patient  Education  2020 [employer].

## 2020-08-24 ENCOUNTER — Encounter (HOSPITAL_COMMUNITY): Payer: Medicare Other

## 2020-08-24 ENCOUNTER — Other Ambulatory Visit: Payer: Self-pay

## 2020-08-24 ENCOUNTER — Ambulatory Visit (HOSPITAL_COMMUNITY)
Admission: RE | Admit: 2020-08-24 | Discharge: 2020-08-24 | Disposition: A | Discharge status: Home / Self Care | Payer: Medicare Other | Source: Ambulatory Visit | Attending: Cardiovascular Disease | Admitting: Cardiovascular Disease

## 2020-08-24 DIAGNOSIS — E782 Mixed hyperlipidemia: Secondary | ICD-10-CM

## 2020-08-24 DIAGNOSIS — I2510 Atherosclerotic heart disease of native coronary artery without angina pectoris: Secondary | ICD-10-CM

## 2020-08-24 DIAGNOSIS — I739 Peripheral vascular disease, unspecified: Secondary | ICD-10-CM | POA: Diagnosis not present

## 2020-08-25 ENCOUNTER — Telehealth: Payer: Self-pay

## 2020-08-25 NOTE — Telephone Encounter (Signed)
Patient's application for [employer] has been completed and faxed. Copy remains at desk.

## 2020-09-01 ENCOUNTER — Other Ambulatory Visit: Payer: Self-pay | Admitting: Family Medicine

## 2020-09-06 ENCOUNTER — Ambulatory Visit (HOSPITAL_COMMUNITY): Payer: Medicare Other | Attending: Cardiovascular Disease

## 2020-09-06 ENCOUNTER — Other Ambulatory Visit: Payer: Self-pay

## 2020-09-06 DIAGNOSIS — I2510 Atherosclerotic heart disease of native coronary artery without angina pectoris: Secondary | ICD-10-CM | POA: Insufficient documentation

## 2020-09-06 DIAGNOSIS — E782 Mixed hyperlipidemia: Secondary | ICD-10-CM | POA: Insufficient documentation

## 2020-09-06 LAB — ECHOCARDIOGRAM COMPLETE
Area-P 1/2: 6.54 cm2
S' Lateral: 3.2 cm
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
## 2020-09-11 ENCOUNTER — Encounter: Payer: Self-pay | Admitting: Cardiology

## 2020-09-11 ENCOUNTER — Ambulatory Visit: Payer: Medicare Other | Admitting: Cardiology

## 2020-09-11 ENCOUNTER — Other Ambulatory Visit: Payer: Self-pay

## 2020-09-11 VITALS — BP 144/90 | HR 98 | Ht 70.0 in | Wt 212.6 lb

## 2020-09-11 DIAGNOSIS — Z794 Long term (current) use of insulin: Secondary | ICD-10-CM

## 2020-09-11 DIAGNOSIS — I10 Essential (primary) hypertension: Secondary | ICD-10-CM

## 2020-09-11 DIAGNOSIS — I2510 Atherosclerotic heart disease of native coronary artery without angina pectoris: Secondary | ICD-10-CM

## 2020-09-11 DIAGNOSIS — E1165 Type 2 diabetes mellitus with hyperglycemia: Secondary | ICD-10-CM | POA: Diagnosis not present

## 2020-09-11 DIAGNOSIS — I739 Peripheral vascular disease, unspecified: Secondary | ICD-10-CM

## 2020-09-11 DIAGNOSIS — E782 Mixed hyperlipidemia: Secondary | ICD-10-CM | POA: Diagnosis not present

## 2020-09-11 DIAGNOSIS — E7800 Pure hypercholesterolemia, unspecified: Secondary | ICD-10-CM

## 2020-09-11 MED ORDER — LISINOPRIL 40 MG PO TABS: 40.0000 mg | ORAL_TABLET | Freq: Every day | ORAL | 3 refills | Status: DC

## 2020-09-11 NOTE — Patient Instructions (Addendum)
Medication Instructions:  Your physician has recommended you make the following change in your medication:  Stop metoprolol. Increase lisinopril to 40 mg by mouth daily  *If you need a refill on your cardiac medications before your next appointment, please call your pharmacy*   Lab Work: Your physician recommends that you return for lab work on 09/14/20.  Lipid profile.  This will be fasting.  The lab opens at 7:30 AM  If you have labs (blood work) drawn today and your tests are completely normal, you will receive your results only by: Marland Kitchen MyChart Message (if you have MyChart) OR . A paper copy in the mail If you have any lab test that is abnormal or we need to change your treatment, we will call you to review the results.   Testing/Procedures: none   Follow-Up: At [facility], you and your health needs are our priority.  As part of our continuing mission to provide you with exceptional heart care, we have created designated Provider Care Teams.  These Care Teams include your primary Cardiologist (physician) and Advanced Practice Providers (APPs -  Physician Assistants and Nurse Practitioners) who all work together to provide you with the care you need, when you need it.  We recommend signing up for the patient portal called "MyChart".  Sign up information is provided on this After Visit Summary.  MyChart is used to connect with patients for Virtual Visits (Telemedicine).  Patients are able to view lab/test results, encounter notes, upcoming appointments, etc.  Non-urgent messages can be sent to your provider as well.   To learn more about what you can do with MyChart, go to [email or web address].    Your next appointment:   March 12, 2021 at 10:40  The format for your next appointment:   In Person  Provider:   Laurance Flatten, MD   Other Instructions

## 2020-09-12 ENCOUNTER — Ambulatory Visit: Payer: Medicare Other | Admitting: Diagnostic Neuroimaging

## 2020-09-12 ENCOUNTER — Telehealth: Payer: Self-pay | Admitting: Diagnostic Neuroimaging

## 2020-09-12 ENCOUNTER — Encounter: Payer: Self-pay | Admitting: Diagnostic Neuroimaging

## 2020-09-12 VITALS — BP 143/89 | HR 98 | Ht 70.0 in | Wt 214.0 lb

## 2020-09-12 DIAGNOSIS — R200 Anesthesia of skin: Secondary | ICD-10-CM | POA: Diagnosis not present

## 2020-09-12 DIAGNOSIS — R202 Paresthesia of skin: Secondary | ICD-10-CM | POA: Diagnosis not present

## 2020-09-12 DIAGNOSIS — M5412 Radiculopathy, cervical region: Secondary | ICD-10-CM | POA: Diagnosis not present

## 2020-09-12 DIAGNOSIS — G959 Disease of spinal cord, unspecified: Secondary | ICD-10-CM | POA: Diagnosis not present

## 2020-09-12 NOTE — Telephone Encounter (Signed)
[employer] medicare order sent to GI. No auth they will reach out to the patient to schedule.  

## 2020-09-12 NOTE — Progress Notes (Signed)
[facility]  PATIENT: Erik Rose DOB: 11-Jun-1956  REFERRING CLINICIAN: [name], DO HISTORY FROM: patient  REASON FOR VISIT: new consult    HISTORICAL  CHIEF COMPLAINT:  Chief Complaint  Patient presents with  . Cervical Radiculopathy    rm 6 New Pt "tingling/pain/pins/needles in right arm, pinched nerve in my neck"    HISTORY OF PRESENT ILLNESS:   64 year old male here for evaluation of right arm numbness.  History of hypertension, diabetes, hypercholesterolemia.  2017 patient was living in [place], had sudden onset of right-sided numbness and weakness.  His right leg felt very heavy.  He had numbness in his right face and arm.  Patient was diagnosed with acute left thalamic ischemic infarct.    In 2018 patient followed up with neurology and was diagnosed with cervical myelopathy and radiculopathy, with cord compression and spinal cord edema. He went to neurosurgery for evaluation was recommended to have ACDF surgery, but patient was reluctant to undergo surgery at that time.  Since that time patient has moved to [facility].  He is now establishing with local physicians.  Patient was referred here for possible EMG nerve conduction study.   REVIEW OF SYSTEMS: Full 14 system review of systems performed and negative with exception of: As per HPI.  ALLERGIES: No Known Allergies  HOME MEDICATIONS: Outpatient Medications Prior to Visit  Medication Sig Dispense Refill  . aspirin EC 81 MG tablet Take 1 tablet (81 mg total) by mouth daily. Swallow whole.    Marland Kitchen atorvastatin (LIPITOR) 80 MG tablet Take 1 tablet (80 mg total) by mouth daily. 90 tablet 3  . Blood Glucose Monitoring Suppl (ONE TOUCH ULTRA 2) w/Device KIT Use to take blood sugar one to two times daily 1 each 0  . carvedilol (COREG) 6.25 MG tablet Take 1 tablet (6.25 mg total) by mouth 2 (two) times daily with a meal. 180 tablet 3  . Esomeprazole Magnesium (NEXIUM PO) Take by  mouth.    . gabapentin (NEURONTIN) 300 MG capsule Take 1 capsule (300 mg total) by mouth 3 (three) times daily. For nerve pain 90 capsule 3  . HUMALOG KWIKPEN 100 UNIT/ML KwikPen INJECT 15 UNITS INTO THE SKIN 3 TIMES DAILY 15 mL 3  . LANTUS SOLOSTAR 100 UNIT/ML Solostar Pen ADMINISTER 55 UNITS UNDER THE SKIN DAILY 51 mL 3  . lisinopril (ZESTRIL) 40 MG tablet Take 1 tablet (40 mg total) by mouth daily. 90 tablet 3  . ONETOUCH ULTRA test strip USE TO CHECK BLOOD SUGAR TWICE DAILY 100 strip 1  . OVER THE COUNTER MEDICATION Take 1 tablet by mouth daily. ALLERGY PILL    . Semaglutide (OZEMPIC, 0.25 OR 0.5 MG/DOSE, Day) Inject 2.5 mg into the skin once a week.    . triamcinolone cream (KENALOG) 0.1 % Apply 1 application topically 2 (two) times daily. 80 g 0   No facility-administered medications prior to visit.    PAST MEDICAL HISTORY: Past Medical History:  Diagnosis Date  . Diabetes mellitus without complication ([place])   . Diverticulitis   . GERD (gastroesophageal reflux disease)   . Heart disease   . Hypertension   . Stroke ([place])   . TIA (transient ischemic attack)     PAST SURGICAL HISTORY: Past Surgical History:  Procedure Laterality Date  . CARDIAC CATHETERIZATION    . CORONARY ANGIOPLASTY WITH STENT PLACEMENT  2010    FAMILY HISTORY: Family History  Problem Relation Age of Onset  . Hypertension Mother   . Kidney  disease Mother   . Miscarriages / [place] Mother   . Stroke Mother   . Heart disease Father   . Depression Sister   . Kidney disease Sister   . Diabetes Brother   . Hypertension Brother   . Hyperlipidemia Brother   . Kidney disease Brother     SOCIAL HISTORY: Social History   Socioeconomic History  . Marital status: Married    Spouse name: [name]  . Number of children: 4  . Years of education: Not on file  . Highest education level: Not on file  Occupational History    Comment: retired  Tobacco Use  . Smoking status: Current Some Day Smoker     Packs/day: 0.50  . Smokeless tobacco: Never Used  Vaping Use  . Vaping Use: Never used  Substance and Sexual Activity  . Alcohol use: Not Currently    Comment: quit 30 yrs ago  . Drug use: Not Currently    Types: Marijuana, Cocaine    Comment: stopped 1991  . Sexual activity: Not on file  Other Topics Concern  . Not on file  Social History Narrative   Lives with wife   Social Determinants of Health   Financial Resource Strain:   . Difficulty of Paying Living Expenses: Not on file  Food Insecurity:   . Worried About Charity fundraiser in the Last Year: Not on file  . Ran Out of Food in the Last Year: Not on file  Transportation Needs:   . Lack of Transportation (Medical): Not on file  . Lack of Transportation (Non-Medical): Not on file  Physical Activity:   . Days of Exercise per Week: Not on file  . Minutes of Exercise per Session: Not on file  Stress:   . Feeling of Stress : Not on file  Social Connections:   . Frequency of Communication with Friends and Family: Not on file  . Frequency of Social Gatherings with Friends and Family: Not on file  . Attends Religious Services: Not on file  . Active Member of Clubs or Organizations: Not on file  . Attends Archivist Meetings: Not on file  . Marital Status: Not on file  Intimate Partner Violence:   . Fear of Current or Ex-Partner: Not on file  . Emotionally Abused: Not on file  . Physically Abused: Not on file  . Sexually Abused: Not on file     PHYSICAL EXAM  GENERAL EXAM/CONSTITUTIONAL: Vitals:  Vitals:   09/12/20 1317  BP: (!) 143/89  Pulse: 98  Weight: 214 lb (97.1 kg)  Height: '5\' 10"'  (1.778 m)     Body mass index is 30.71 kg/m. Wt Readings from Last 3 Encounters:  09/12/20 214 lb (97.1 kg)  09/11/20 212 lb 9.6 oz (96.4 kg)  08/10/20 211 lb 12.8 oz (96.1 kg)     Patient is in no distress; well developed, nourished and groomed; neck is supple  CARDIOVASCULAR:  Examination of carotid  arteries is normal; no carotid bruits  Regular rate and rhythm, no murmurs  Examination of peripheral vascular system by observation and palpation is normal  EYES:  Ophthalmoscopic exam of optic discs and posterior segments is normal; no papilledema or hemorrhages  No exam data present  MUSCULOSKELETAL:  Gait, strength, tone, movements noted in Neurologic exam below  NEUROLOGIC: MENTAL STATUS:  No flowsheet data found.  awake, alert, oriented to person, place and time  recent and remote memory intact  normal attention and concentration  language fluent,  comprehension intact, naming intact  fund of knowledge appropriate  CRANIAL NERVE:   2nd - no papilledema on fundoscopic exam  2nd, 3rd, 4th, 6th - pupils equal and reactive to light, visual fields full to confrontation, extraocular muscles intact, no nystagmus  5th - facial sensation symmetric  7th - facial strength symmetric  8th - hearing intact  9th - palate elevates symmetrically, uvula midline  11th - shoulder shrug symmetric  12th - tongue protrusion midline  MOTOR:   normal bulk and tone, full strength in the BUE, BLE  SENSORY:   normal and symmetric to light touch, pinprick, temperature, vibration; except decr in right hand and arm  COORDINATION:   finger-nose-finger, fine finger movements normal  REFLEXES:   deep tendon reflexes 1+ and symmetric  GAIT/STATION:   narrow based gait     DIAGNOSTIC DATA (LABS, IMAGING, TESTING) - I reviewed patient records, labs, notes, testing and imaging myself where available.  Lab Results  Component Value Date   WBC 8.6 01/18/2020   HGB 14.8 01/18/2020   HCT 44.1 01/18/2020   MCV 88.2 01/18/2020   PLT 191 01/18/2020      Component Value Date/Time   NA 139 01/18/2020 1051   K 3.9 01/18/2020 1051   CL 106 01/18/2020 1051   CO2 26 01/18/2020 1051   GLUCOSE 113 (H) 01/18/2020 1051   BUN 11 01/18/2020 1051   CREATININE 0.93 01/18/2020 1051    CALCIUM 9.3 01/18/2020 1051   PROT 7.2 01/18/2020 1051   ALBUMIN 4.3 09/09/2018 1513   AST 14 01/18/2020 1051   ALT 17 01/18/2020 1051   ALKPHOS 98 09/09/2018 1513   BILITOT 0.6 01/18/2020 1051   GFRNONAA 88 01/18/2020 1051   GFRAA 102 01/18/2020 1051   Lab Results  Component Value Date   CHOL 130 01/18/2020   HDL 39 (L) 01/18/2020   LDLCALC 74 01/18/2020   LDLDIRECT 112.0 09/09/2018   TRIG 88 01/18/2020   CHOLHDL 3.3 01/18/2020   Lab Results  Component Value Date   HGBA1C 9.8 (A) 07/19/2020   No results found for: VITAMINB12 No results found for: TSH  01/11/16 MRI brain [report only] 1. Acute to subacute left thalamic infarct (likely PCA/perforator territory).  2. No hemorrhage or significant mass effect.  3. Mild to moderate chronic microvascular changes.  4. MR angiography to follow.   01/11/16 MRA head  1. No acute angiographic finding, unremarkable MRA head exam.   01/11/16 MRA neck 1. Unremarkable non-contrast MRA of the neck.   05/19/17 MRI cervical spine [report only] 1. Moderate multilevel degenerative disc disease and cervical spondylosis is more notable at C3-4 and C4-5 levels where there is canal stenosis, cord impingement and cord compression with increased cord T2 intensity signal. 2. At C3-4 disc-osteophyte complex is eccentric to the left with moderate canal narrowing, and  moderate-to-severe left foraminal narrowing and mild-to-moderate right foraminal narrowing. 3. At C4-5 disc-osteophyte complex with moderate-to-severe canal stenosis, and moderate-to-severe bilateral  foraminal narrowing that appears greater on the left.     ASSESSMENT AND PLAN  64 y.o. year old male here with:   Dx:  1. Cervical myelopathy ([place])   2. Cervical myelopathy with cervical radiculopathy ([facility])   3. Numbness and tingling in right hand       PLAN:  CERVICAL SPINAL STENOSIS AND CORD COMPRESSION (diagnosed in 2018 on MRI from [place]; recommended to have ACDF  at that time but patient wanted to hold off; no clinical signs of myelopathy at this  time) - check MRI cervical spine  RIGHT HAND NUMBNESS (? residual from left thalamic stroke vs carpal tunnel syndrome vs cervical radiculopathy) - check EMG/NCS  Orders Placed This Encounter  Procedures  . MR CERVICAL SPINE WO CONTRAST  . NCV with EMG(electromyography)   Return for for NCV/EMG.    Penni Bombard, MD 32/10/2246, 2:50 PM Certified in Neurology, Neurophysiology and Neuroimaging  [facility] [address], [place], [place] [address] [phone]

## 2020-09-14 ENCOUNTER — Other Ambulatory Visit: Payer: Self-pay

## 2020-09-14 ENCOUNTER — Other Ambulatory Visit: Payer: Medicare Other | Admitting: *Deleted

## 2020-09-14 DIAGNOSIS — E782 Mixed hyperlipidemia: Secondary | ICD-10-CM | POA: Diagnosis not present

## 2020-09-14 DIAGNOSIS — I2510 Atherosclerotic heart disease of native coronary artery without angina pectoris: Secondary | ICD-10-CM | POA: Diagnosis not present

## 2020-09-15 LAB — LIPID PANEL
Chol/HDL Ratio: 2.6 ratio (ref 0.0–5.0)
Cholesterol, Total: 138 mg/dL (ref 100–199)
HDL: 53 mg/dL (ref >39)
LDL Chol Calc (NIH): 72 mg/dL (ref 0–99)
Triglycerides: 63 mg/dL (ref 0–149)
VLDL Cholesterol Cal: 13 mg/dL (ref 5–40)

## 2020-09-18 ENCOUNTER — Other Ambulatory Visit: Payer: Self-pay

## 2020-09-18 DIAGNOSIS — E782 Mixed hyperlipidemia: Secondary | ICD-10-CM

## 2020-09-18 NOTE — Progress Notes (Signed)
Per Dr Shari Prows repeat lipid panel in 6 months.

## 2020-09-21 ENCOUNTER — Other Ambulatory Visit: Payer: Self-pay

## 2020-09-21 ENCOUNTER — Ambulatory Visit
Admission: RE | Admit: 2020-09-21 | Discharge: 2020-09-21 | Disposition: A | Discharge status: Home / Self Care | Payer: Medicare Other | Source: Ambulatory Visit | Attending: Diagnostic Neuroimaging | Admitting: Diagnostic Neuroimaging

## 2020-09-21 DIAGNOSIS — G959 Disease of spinal cord, unspecified: Secondary | ICD-10-CM | POA: Diagnosis not present

## 2020-09-21 DIAGNOSIS — M4802 Spinal stenosis, cervical region: Secondary | ICD-10-CM | POA: Diagnosis not present

## 2020-09-21 DIAGNOSIS — M5412 Radiculopathy, cervical region: Secondary | ICD-10-CM | POA: Diagnosis not present

## 2020-10-05 DIAGNOSIS — Z20822 Contact with and (suspected) exposure to covid-19: Secondary | ICD-10-CM | POA: Diagnosis not present

## 2020-10-09 ENCOUNTER — Telehealth: Payer: Self-pay | Admitting: *Deleted

## 2020-10-09 DIAGNOSIS — R200 Anesthesia of skin: Secondary | ICD-10-CM

## 2020-10-09 DIAGNOSIS — R202 Paresthesia of skin: Secondary | ICD-10-CM

## 2020-10-09 DIAGNOSIS — R9389 Abnormal findings on diagnostic imaging of other specified body structures: Secondary | ICD-10-CM

## 2020-10-09 DIAGNOSIS — M5412 Radiculopathy, cervical region: Secondary | ICD-10-CM

## 2020-10-09 DIAGNOSIS — G959 Disease of spinal cord, unspecified: Secondary | ICD-10-CM

## 2020-10-09 NOTE — Telephone Encounter (Signed)
Spoke with patient and informed him his MRI cervical spine showed moderate spinal stenosis at C3-4 and C4-5; other multi-level degenerative changes. Dr Marjory Lies recommends a neurosurgery consult if patient agrees. He stated he does agree,  verbalized understanding, appreciation. Order placed.

## 2020-10-12 ENCOUNTER — Encounter: Payer: Medicare Other | Admitting: Diagnostic Neuroimaging

## 2020-10-12 ENCOUNTER — Ambulatory Visit (INDEPENDENT_AMBULATORY_CARE_PROVIDER_SITE_OTHER): Payer: Medicare Other | Admitting: Diagnostic Neuroimaging

## 2020-10-12 DIAGNOSIS — G959 Disease of spinal cord, unspecified: Secondary | ICD-10-CM

## 2020-10-12 DIAGNOSIS — R200 Anesthesia of skin: Secondary | ICD-10-CM

## 2020-10-12 DIAGNOSIS — R202 Paresthesia of skin: Secondary | ICD-10-CM

## 2020-10-12 DIAGNOSIS — M5412 Radiculopathy, cervical region: Secondary | ICD-10-CM | POA: Diagnosis not present

## 2020-10-12 DIAGNOSIS — Z0289 Encounter for other administrative examinations: Secondary | ICD-10-CM

## 2020-10-12 NOTE — Procedures (Signed)
[facility]  NCS (NERVE CONDUCTION STUDY) WITH EMG (ELECTROMYOGRAPHY) REPORT   STUDY DATE: 10/12/20 PATIENT NAME: Erik Rose DOB: 02-05-1956 MRN: [id number]  ORDERING CLINICIAN: Joycelyn Schmid, MD   TECHNOLOGIST: Durenda Age ELECTROMYOGRAPHER: Glenford Bayley. Kaliah Haddaway, MD  CLINICAL INFORMATION: 65 year old male with right hand pain.  FINDINGS: NERVE CONDUCTION STUDY:  Right median motor responses prolonged distal latency (11.4 ms), decreased amplitude and slow conduction velocity (37 m/s).  Martin-Gruber anastomosis variant noted.  Left median and right ulnar motor responses are normal.  Right median sensory response has prolonged peak latency and decreased amplitude.  Left median and right ulnar sensory responses are normal.  Right ulnar F-wave latency is normal.   NEEDLE ELECTROMYOGRAPHY:  Needle examination of right upper extremity is normal.   IMPRESSION:   Abnormal study demonstrating: - Right median neuropathy at the wrist consistent with right carpal tunnel syndrome.    INTERPRETING PHYSICIAN:  Suanne Marker, MD Certified in Neurology, Neurophysiology and Neuroimaging  [facility] [address] [place], [place] [address] [phone]  [facility]    Nerve / Sites Muscle Latency Ref. Amplitude Ref. Rel Amp Segments Distance Velocity Ref. Area    ms ms mV mV %  cm m/s m/s mVms  R Median - APB     Wrist APB 11.4 ?4.4 0.5 ?4.0 100 Wrist - APB 7   2.1     Upper arm APB 17.6  1.5  320 Upper arm - Wrist 23 37 ?49 6.4     Ulnar Wrist APB 3.5  3.4  231 Ulnar Wrist - Upper arm    14.5     Ulnar B. Elbow APB 7.8  2.9  86 Ulnar Wrist - Wrist    10.8     Ulnar A. Elbow APB 9.5  3.0  104 Ulnar B. Elbow - Ulnar Wrist    10.7         Ulnar B. Elbow - Wrist             Ulnar A. Elbow - APB      L Median - APB     Wrist APB 4.1 ?4.4 7.6 ?4.0 100 Wrist - APB 7   23.3     Upper arm APB 8.8  7.2  95.5 Upper arm - Wrist  23 50 ?49 23.4  R Ulnar - ADM     Wrist ADM 3.1 ?3.3 11.3 ?6.0 100 Wrist - ADM 7   28.3     B.Elbow ADM 7.3  11.1  98.2 B.Elbow - Wrist 23 55 ?49 30.3     A.Elbow ADM 9.1  10.9  98.6 A.Elbow - B.Elbow 10 55 ?49 30.3         A.Elbow - Wrist               SNC    Nerve / Sites Rec. Site Peak Lat Ref.  Amp Ref. Segments Distance    ms ms V V  cm  R Median - Orthodromic (Dig II, Mid palm)     Dig II Wrist 3.7 ?3.4 3 ?10 Dig II - Wrist 13  L Median - Orthodromic (Dig II, Mid palm)     Dig II Wrist 3.3 ?3.4 13 ?10 Dig II - Wrist 13  R Ulnar - Orthodromic, (Dig V, Mid palm)     Dig V Wrist 2.9 ?3.1 5 ?5 Dig V - Wrist 11           F  Wave    Nerve F Lat Ref.   ms ms  R Ulnar - ADM 31.1 ?32.0       EMG Summary Table    Spontaneous MUAP Recruitment  Muscle IA Fib PSW Fasc Other Amp Dur. Poly Pattern  R. Cervical paraspinals Normal None None None _______ Normal Normal Normal Normal  R. Deltoid Normal None None None _______ Normal Normal Normal Normal  R. Biceps brachii Normal None None None _______ Normal Normal Normal Normal  R. Triceps brachii Normal None None None _______ Normal Normal Normal Normal  R. Flexor carpi radialis Normal None None None _______ Normal Normal Normal Normal  R. First dorsal interosseous Normal None None None _______ Normal Normal Normal Normal

## 2020-10-19 ENCOUNTER — Ambulatory Visit (INDEPENDENT_AMBULATORY_CARE_PROVIDER_SITE_OTHER): Payer: Medicare Other | Admitting: Family Medicine

## 2020-10-19 ENCOUNTER — Encounter: Payer: Self-pay | Admitting: Family Medicine

## 2020-10-19 ENCOUNTER — Other Ambulatory Visit: Payer: Self-pay

## 2020-10-19 VITALS — BP 154/84 | HR 99 | Ht 70.0 in | Wt 214.0 lb

## 2020-10-19 DIAGNOSIS — Z794 Long term (current) use of insulin: Secondary | ICD-10-CM

## 2020-10-19 DIAGNOSIS — I2510 Atherosclerotic heart disease of native coronary artery without angina pectoris: Secondary | ICD-10-CM

## 2020-10-19 DIAGNOSIS — I10 Essential (primary) hypertension: Secondary | ICD-10-CM | POA: Diagnosis not present

## 2020-10-19 DIAGNOSIS — E1165 Type 2 diabetes mellitus with hyperglycemia: Secondary | ICD-10-CM | POA: Diagnosis not present

## 2020-10-19 DIAGNOSIS — M5412 Radiculopathy, cervical region: Secondary | ICD-10-CM

## 2020-10-19 DIAGNOSIS — F17210 Nicotine dependence, cigarettes, uncomplicated: Secondary | ICD-10-CM

## 2020-10-19 LAB — POCT GLYCOSYLATED HEMOGLOBIN (HGB A1C): Hemoglobin A1C: 7.7 % — AB (ref 4.0–5.6)

## 2020-10-19 MED ORDER — CARVEDILOL 12.5 MG PO TABS
12.5000 mg | ORAL_TABLET | Freq: Two times a day (BID) | ORAL | 1 refills | Status: DC
Start: 2020-10-19 — End: 2021-04-24

## 2020-10-19 NOTE — Assessment & Plan Note (Signed)
He is seeing neurology.  Has some carpal tunnel as well.

## 2020-10-19 NOTE — Progress Notes (Signed)
Erik Rose - 65 y.o. male MRN [id number]  Date of birth: Feb 19, 1956  Subjective No chief complaint on file.   HPI Erik Rose is a 65 y.o. male here today for follow up visit. He has a history of T2DM, HTN, history of stroke and CAD.    Diabetes is currently managed with Ozempic, lantus 55 units daily and humalog 15 units tid.  Blood sugars around 120-140 when checking at home.  Denies hypoglycemia.  He does walk occasionally for exercise.    HTN is currently treated with lisinopril 40mg  and carvedilol 6.25mg  BID.  Carvedilol was added at his cardiology appt to replace metoprolol in December.  His lisinopril was also increased to 40mg  at that time.  He does not check BP at home regularly.  He denies chest pain, shortness of breath, palpitations, headache or vision chagnes.  Seeing neuro for cervical myelopathy.  Symptoms currently managed with gabapentin.     ROS:  A comprehensive ROS was completed and negative except as noted per HPI  No Known Allergies  Past Medical History:  Diagnosis Date  . Diabetes mellitus without complication (HCC)   . Diverticulitis   . GERD (gastroesophageal reflux disease)   . Heart disease   . Hypertension   . Stroke ([facility])   . TIA (transient ischemic attack)     Past Surgical History:  Procedure Laterality Date  . CARDIAC CATHETERIZATION    . CORONARY ANGIOPLASTY WITH STENT PLACEMENT  2010    Social History   Socioeconomic History  . Marital status: Married    Spouse name:  . Number of children: 4  . Years of education: Not on file  . Highest education level: Not on file  Occupational History    Comment: retired  Tobacco Use  . Smoking status: Current Some Day Smoker    Packs/day: 0.50  . Smokeless tobacco: Never Used  Vaping Use  . Vaping Use: Never used  Substance and Sexual Activity  . Alcohol use: Not Currently    Comment: quit 30 yrs ago  . Drug use: Not Currently    Types: Marijuana, Cocaine    Comment:  stopped 1991  . Sexual activity: Not on file  Other Topics Concern  . Not on file  Social History Narrative   Lives with wife   Social Determinants of Health   Financial Resource Strain: Not on file  Food Insecurity: Not on file  Transportation Needs: Not on file  Physical Activity: Not on file  Stress: Not on file  Social Connections: Not on file    Family History  Problem Relation Age of Onset  . Hypertension Mother   . Kidney disease Mother   . Miscarriages / 2011 Mother   . Stroke Mother   . Heart disease Father   . Depression Sister   . Kidney disease Sister   . Diabetes Brother   . Hypertension Brother   . Hyperlipidemia Brother   . Kidney disease Brother     Health Maintenance  Topic Date Due  . Hepatitis C Screening  Never done  . FOOT EXAM  Never done  . OPHTHALMOLOGY EXAM  Never done  . COLONOSCOPY (Pts 45-38yrs Insurance coverage will need to be confirmed)  01/17/2019  . COVID-19 Vaccine (3 - Booster) 07/07/2020  . HEMOGLOBIN A1C  04/18/2021  . TETANUS/TDAP  07/19/2030  . INFLUENZA VACCINE  Completed  . PNEUMOCOCCAL POLYSACCHARIDE VACCINE AGE 35-64 HIGH RISK  Completed  . HIV Screening  Completed     -----------------------------------------------------------------------------------------------------------------------------------------------------------------------------------------------------------------  Physical Exam BP (!) 154/84   Pulse 99   Ht 5\' 10"  (1.778 m)   Wt 214 lb (97.1 kg)   BMI 30.71 kg/m   Physical Exam Constitutional:      Appearance: Normal appearance.  HENT:     Head: Normocephalic and atraumatic.  Eyes:     General: No scleral icterus. Cardiovascular:     Rate and Rhythm: Normal rate and regular rhythm.  Pulmonary:     Effort: Pulmonary effort is normal.     Breath sounds: Normal breath sounds.  Musculoskeletal:     Cervical back: Neck supple.  Neurological:     General: No focal deficit present.     Mental  Status: He is alert.  Psychiatric:        Mood and Affect: Mood normal.        Behavior: Behavior normal.     ------------------------------------------------------------------------------------------------------------------------------------------------------------------------------------------------------------------- Assessment and Plan  Essential hypertension BP remains high, will continue to titrate coreg with increase to 12.5mg  BID.  Continue lisinopril at 40mg  daily. Reminded to follow low salt diet and quit smoking.    CAD (coronary artery disease), native coronary artery He is seeing cardiology, aggressive medical management recommended.   Type 2 diabetes mellitus with hyperglycemia, with long-term current use of insulin (HCC) Diabetes control improved.  Continue to work on dietary changes.   Continue insulin and Ozempic at current dosing.    Cervical radiculopathy He is seeing neurology.  Has some carpal tunnel as well.    Smoking greater than 20 pack years Counseled on smoking cessation.    Meds ordered this encounter  Medications  . carvedilol (COREG) 12.5 MG tablet    Sig: Take 1 tablet (12.5 mg total) by mouth 2 (two) times daily with a meal.    Dispense:  180 tablet    Refill:  1    Return in about 3 months (around 01/17/2021) for HTN/DM.    This visit occurred during the SARS-CoV-2 public health emergency.  Safety protocols were in place, including screening questions prior to the visit, additional usage of staff PPE, and extensive cleaning of exam room while observing appropriate contact time as indicated for disinfecting solutions.

## 2020-10-19 NOTE — Assessment & Plan Note (Signed)
Counseled on smoking cessation  

## 2020-10-19 NOTE — Assessment & Plan Note (Signed)
Diabetes control improved.  Continue to work on dietary changes.   Continue insulin and Ozempic at current dosing.

## 2020-10-19 NOTE — Patient Instructions (Addendum)
Nice to see you today! A1c is 7.7% today. Let's increase the coreg to 12.5mg  twice per day.  Continue other medications.  See me again in 3 months.

## 2020-10-19 NOTE — Assessment & Plan Note (Signed)
BP remains high, will continue to titrate coreg with increase to 12.5mg  BID.  Continue lisinopril at 40mg  daily. Reminded to follow low salt diet and quit smoking.

## 2020-10-19 NOTE — Assessment & Plan Note (Signed)
He is seeing cardiology, aggressive medical management recommended.

## 2020-11-17 ENCOUNTER — Other Ambulatory Visit: Payer: Self-pay

## 2020-11-17 ENCOUNTER — Telehealth: Payer: Self-pay

## 2020-11-17 DIAGNOSIS — Z794 Long term (current) use of insulin: Secondary | ICD-10-CM | POA: Insufficient documentation

## 2020-11-17 DIAGNOSIS — I2510 Atherosclerotic heart disease of native coronary artery without angina pectoris: Secondary | ICD-10-CM | POA: Diagnosis not present

## 2020-11-17 DIAGNOSIS — Z7982 Long term (current) use of aspirin: Secondary | ICD-10-CM | POA: Insufficient documentation

## 2020-11-17 DIAGNOSIS — K859 Acute pancreatitis without necrosis or infection, unspecified: Secondary | ICD-10-CM | POA: Diagnosis not present

## 2020-11-17 DIAGNOSIS — R1013 Epigastric pain: Secondary | ICD-10-CM | POA: Insufficient documentation

## 2020-11-17 DIAGNOSIS — Z79899 Other long term (current) drug therapy: Secondary | ICD-10-CM | POA: Diagnosis not present

## 2020-11-17 DIAGNOSIS — Z951 Presence of aortocoronary bypass graft: Secondary | ICD-10-CM | POA: Diagnosis not present

## 2020-11-17 DIAGNOSIS — Z7984 Long term (current) use of oral hypoglycemic drugs: Secondary | ICD-10-CM | POA: Insufficient documentation

## 2020-11-17 DIAGNOSIS — F17200 Nicotine dependence, unspecified, uncomplicated: Secondary | ICD-10-CM | POA: Diagnosis not present

## 2020-11-17 DIAGNOSIS — R110 Nausea: Secondary | ICD-10-CM | POA: Insufficient documentation

## 2020-11-17 DIAGNOSIS — E119 Type 2 diabetes mellitus without complications: Secondary | ICD-10-CM | POA: Insufficient documentation

## 2020-11-17 DIAGNOSIS — I10 Essential (primary) hypertension: Secondary | ICD-10-CM | POA: Insufficient documentation

## 2020-11-17 NOTE — Telephone Encounter (Signed)
LVM advising patient. Lantus Solostar 100mg  is here in the fridge. Ready for pick-up. His name is on the meds.

## 2020-11-18 ENCOUNTER — Encounter (HOSPITAL_COMMUNITY): Payer: Self-pay | Admitting: Emergency Medicine

## 2020-11-18 ENCOUNTER — Emergency Department (HOSPITAL_COMMUNITY)
Admission: EM | Admit: 2020-11-18 | Discharge: 2020-11-18 | Disposition: A | Discharge status: Home / Self Care | Payer: Medicare Other | Attending: Emergency Medicine | Admitting: Emergency Medicine

## 2020-11-18 ENCOUNTER — Emergency Department (HOSPITAL_COMMUNITY): Payer: Medicare Other

## 2020-11-18 DIAGNOSIS — R1013 Epigastric pain: Secondary | ICD-10-CM | POA: Diagnosis not present

## 2020-11-18 DIAGNOSIS — K859 Acute pancreatitis without necrosis or infection, unspecified: Secondary | ICD-10-CM

## 2020-11-18 LAB — URINALYSIS, ROUTINE W REFLEX MICROSCOPIC
Bacteria, UA: NONE SEEN
Bilirubin Urine: NEGATIVE
Glucose, UA: NEGATIVE mg/dL
Hgb urine dipstick: NEGATIVE
Ketones, ur: NEGATIVE mg/dL
Leukocytes,Ua: NEGATIVE
Nitrite: NEGATIVE
Protein, ur: 30 mg/dL — AB
Specific Gravity, Urine: 1.021 (ref 1.005–1.030)
pH: 8 (ref 5.0–8.0)

## 2020-11-18 LAB — CBC
HCT: 38.1 % — ABNORMAL LOW (ref 39.0–52.0)
Hemoglobin: 12.8 g/dL — ABNORMAL LOW (ref 13.0–17.0)
MCH: 31 pg (ref 26.0–34.0)
MCHC: 33.6 g/dL (ref 30.0–36.0)
MCV: 92.3 fL (ref 80.0–100.0)
Platelets: 226 10*3/uL (ref 150–400)
RBC: 4.13 MIL/uL — ABNORMAL LOW (ref 4.22–5.81)
RDW: 14.1 % (ref 11.5–15.5)
WBC: 10.4 10*3/uL (ref 4.0–10.5)
nRBC: 0 % (ref 0.0–0.2)

## 2020-11-18 LAB — COMPREHENSIVE METABOLIC PANEL
ALT: 22 U/L (ref 0–44)
AST: 22 U/L (ref 15–41)
Albumin: 3.7 g/dL (ref 3.5–5.0)
Alkaline Phosphatase: 92 U/L (ref 38–126)
Anion gap: 9 (ref 5–15)
BUN: 14 mg/dL (ref 8–23)
CO2: 25 mmol/L (ref 22–32)
Calcium: 8.9 mg/dL (ref 8.9–10.3)
Chloride: 106 mmol/L (ref 98–111)
Creatinine, Ser: 0.77 mg/dL (ref 0.61–1.24)
GFR, Estimated: >60 mL/min (ref >60)
Glucose, Bld: 130 mg/dL — ABNORMAL HIGH (ref 70–99)
Potassium: 3.4 mmol/L — ABNORMAL LOW (ref 3.5–5.1)
Sodium: 140 mmol/L (ref 135–145)
Total Bilirubin: 0.5 mg/dL (ref 0.3–1.2)
Total Protein: 7.4 g/dL (ref 6.5–8.1)

## 2020-11-18 LAB — LIPASE, BLOOD: Lipase: 1093 U/L — ABNORMAL HIGH (ref 11–51)

## 2020-11-18 MED ORDER — MORPHINE SULFATE (PF) 4 MG/ML IV SOLN
4.0000 mg | Freq: Once | INTRAVENOUS | Status: AC
  Administered 2020-11-18: 4 mg via INTRAVENOUS
  Filled 2020-11-18: qty 1

## 2020-11-18 MED ORDER — SODIUM CHLORIDE 0.9 % IV BOLUS
1000.0000 mL | Freq: Once | INTRAVENOUS | Status: AC
  Administered 2020-11-18: 1000 mL via INTRAVENOUS

## 2020-11-18 MED ORDER — OXYCODONE-ACETAMINOPHEN 5-325 MG PO TABS: 1.0000 | ORAL_TABLET | ORAL | 0 refills | Status: DC | PRN

## 2020-11-18 MED ORDER — ONDANSETRON HCL 4 MG/2ML IJ SOLN
4.0000 mg | Freq: Once | INTRAMUSCULAR | Status: AC
  Administered 2020-11-18: 4 mg via INTRAVENOUS
  Filled 2020-11-18: qty 2

## 2020-11-18 MED ORDER — ONDANSETRON 4 MG PO TBDP: 4.0000 mg | ORAL_TABLET | Freq: Three times a day (TID) | ORAL | 0 refills | Status: DC | PRN

## 2020-11-18 NOTE — ED Triage Notes (Signed)
Patient here from home reporting abd pain above belly button that started 9 days ago increased last night with difficulty sleeping. Denies sleeping.

## 2020-11-18 NOTE — ED Provider Notes (Signed)
[facility] DEPT Provider Note   CSN: [id number] Arrival date & time: 11/17/20  2339     History Chief Complaint  Patient presents with  . Abdominal Pain    Erik Rose is a 65 y.o. male.  The history is provided by medical records.  Abdominal Pain Associated symptoms: nausea     65 year old male here with upper abdominal pain with radiation to the back for the past 10 days. States he has never experienced anything like this before. Pain does seem a little worse after eating but never fully goes away either. Has had maybe some mild nausea but has not had any vomiting. He has continued eating and drinking fine. He denies any burning type sensation like acid reflux. He has had normal bowel movements. Is not had any prior abdominal surgeries. Does have history of diverticulitis in the past and has had a few colonoscopies for evaluation, but states this feels different.  No random fevers, night sweats, weight loss, etc.  Past Medical History:  Diagnosis Date  . Diabetes mellitus without complication ([employer])   . Diverticulitis   . GERD (gastroesophageal reflux disease)   . Heart disease   . Hypertension   . Stroke ([place])   . TIA (transient ischemic attack)     Patient Active Problem List   Diagnosis Date Noted  . Cervical radiculopathy 04/18/2020  . Smoking greater than 20 pack years 04/18/2020  . Eczema 01/18/2020  . Colon cancer screening 12/09/2018  . Essential hypertension 09/09/2018  . Type 2 diabetes mellitus with hyperglycemia, with long-term current use of insulin ([place]) 09/09/2018  . CAD (coronary artery disease), native coronary artery 09/09/2018  . History of CVA (cerebrovascular accident) 09/09/2018  . Mixed hyperlipidemia 02/05/2018    Past Surgical History:  Procedure Laterality Date  . CARDIAC CATHETERIZATION    . CORONARY ANGIOPLASTY WITH STENT PLACEMENT  2010       Family History  Problem Relation Age of Onset  .  Hypertension Mother   . Kidney disease Mother   . Miscarriages / [place] Mother   . Stroke Mother   . Heart disease Father   . Depression Sister   . Kidney disease Sister   . Diabetes Brother   . Hypertension Brother   . Hyperlipidemia Brother   . Kidney disease Brother     Social History   Tobacco Use  . Smoking status: Current Some Day Smoker    Packs/day: 0.50  . Smokeless tobacco: Never Used  Vaping Use  . Vaping Use: Never used  Substance Use Topics  . Alcohol use: Not Currently    Comment: quit 30 yrs ago  . Drug use: Not Currently    Types: Marijuana, Cocaine    Comment: stopped 1991    Home Medications Prior to Admission medications   Medication Sig Start Date End Date Taking? Authorizing Provider  aspirin EC 81 MG tablet Take 1 tablet (81 mg total) by mouth daily. Swallow whole. 08/10/20   Freada Bergeron, MD  atorvastatin (LIPITOR) 80 MG tablet Take 1 tablet (80 mg total) by mouth daily. 07/19/20   [name], DO  Blood Glucose Monitoring Suppl (ONE TOUCH ULTRA 2) w/Device KIT Use to take blood sugar one to two times daily 09/18/18   [name], DO  carvedilol (COREG) 12.5 MG tablet Take 1 tablet (12.5 mg total) by mouth 2 (two) times daily with a meal. 10/19/20   [name], DO  Esomeprazole Magnesium (NEXIUM PO) Take by mouth.  [provider]  gabapentin (NEURONTIN) 300 MG capsule Take 1 capsule (300 mg total) by mouth 3 (three) times daily. For nerve pain 04/18/20   [name], DO  HUMALOG KWIKPEN 100 UNIT/ML KwikPen INJECT 15 UNITS INTO THE SKIN 3 TIMES DAILY 02/14/20   [name], DO  LANTUS SOLOSTAR 100 UNIT/ML Solostar Pen ADMINISTER 55 UNITS UNDER THE SKIN DAILY 03/08/20   [name], DO  lisinopril (ZESTRIL) 40 MG tablet Take 1 tablet (40 mg total) by mouth daily. 09/11/20   Freada Bergeron, MD  ONETOUCH ULTRA test strip USE TO CHECK BLOOD SUGAR TWICE DAILY 06/06/20   [name], DO  OVER THE COUNTER MEDICATION  Take 1 tablet by mouth daily. ALLERGY PILL    [provider]  Semaglutide (OZEMPIC, 0.25 OR 0.5 MG/DOSE, [place]) Inject 2.5 mg into the skin once a week.    [provider]    Allergies    Patient has no known allergies.  Review of Systems   Review of Systems  Gastrointestinal: Positive for abdominal pain and nausea.  All other systems reviewed and are negative.   Physical Exam Updated Vital Signs BP (!) 180/99   Pulse 79   Temp 98.1 F (36.7 C) (Oral)   Resp 14   Ht '5\' 10"'  (1.778 m)   Wt 97.5 kg   SpO2 100%   BMI 30.85 kg/m   Physical Exam Vitals and nursing note reviewed.  Constitutional:      Appearance: He is well-developed and well-nourished.  HENT:     Head: Normocephalic and atraumatic.     Mouth/Throat:     Mouth: Oropharynx is clear and moist.  Eyes:     Extraocular Movements: EOM normal.     Conjunctiva/sclera: Conjunctivae normal.     Pupils: Pupils are equal, round, and reactive to light.  Cardiovascular:     Rate and Rhythm: Normal rate and regular rhythm.     Heart sounds: Normal heart sounds.  Pulmonary:     Effort: Pulmonary effort is normal.     Breath sounds: Normal breath sounds.  Abdominal:     General: Bowel sounds are normal.     Palpations: Abdomen is soft.     Tenderness: There is abdominal tenderness in the epigastric area.  Musculoskeletal:        General: Normal range of motion.     Cervical back: Normal range of motion.  Skin:    General: Skin is warm and dry.  Neurological:     Mental Status: He is alert and oriented to person, place, and time.  Psychiatric:        Mood and Affect: Mood and affect normal.     ED Results / Procedures / Treatments   Labs (all labs ordered are listed, but only abnormal results are displayed) Labs Reviewed  LIPASE, BLOOD - Abnormal; Notable for the following components:      Result Value   Lipase 1,093 (*)    All other components within normal limits  COMPREHENSIVE METABOLIC  PANEL - Abnormal; Notable for the following components:   Potassium 3.4 (*)    Glucose, Bld 130 (*)    All other components within normal limits  CBC - Abnormal; Notable for the following components:   RBC 4.13 (*)    Hemoglobin 12.8 (*)    HCT 38.1 (*)    All other components within normal limits  URINALYSIS, ROUTINE W REFLEX MICROSCOPIC - Abnormal; Notable for the following components:   Protein,  ur 30 (*)    All other components within normal limits    EKG None  Radiology US Abdomen Limited RUQ (LIVER/GB)  Result Date: 11/18/2020 CLINICAL DATA:  Epigastric region pain. EXAM: ULTRASOUND ABDOMEN LIMITED RIGHT UPPER QUADRANT COMPARISON:  None. FINDINGS: Gallbladder: No gallstones or wall thickening visualized. No sonographic Murphy sign noted by sonographer. Common bile duct: Diameter: 3 mm, normal. Liver: No focal lesion identified. Within normal limits in parenchymal echogenicity. Portal vein is patent on color Doppler imaging with normal direction of blood flow towards the liver. Other: None. IMPRESSION: Normal right upper quadrant ultrasound. Electronically Signed   By: [place] Chimes M.D.   On: 11/18/2020 01:40    Procedures Procedures   Medications Ordered in ED Medications  morphine 4 MG/ML injection 4 mg (4 mg Intravenous Given 11/18/20 0140)  ondansetron (ZOFRAN) injection 4 mg (4 mg Intravenous Given 11/18/20 0140)  sodium chloride 0.9 % bolus 1,000 mL (0 mLs Intravenous Stopped 11/18/20 0306)  morphine 4 MG/ML injection 4 mg (4 mg Intravenous Given 11/18/20 0310)    ED Course  I have reviewed the triage vital signs and the nursing notes.  Pertinent labs & imaging results that were available during my care of the patient were reviewed by me and considered in my medical decision making (see chart for details).    MDM Rules/Calculators/A&P  65 year old male here with upper abdominal pain with radiation to the back for the past 10 days. He is afebrile and nontoxic in  appearance here. Does have some tenderness in the epigastrium and a little bit to right upper abdomen. Labs as above, lipase is elevated at 1093. Normal LFTs and bili. Patient has no known history of gallstones and denies any recent alcohol abuse (drinks maybe twice a year). Right upper quadrant ultrasound was obtained which is negative. Given IV fluids, morphine, Zofran. Will reassess. Uncertain etiology as to his pancreatitis. He is currently on Ozempic and states he was just started on this within the past 3 months. Some cases have shown that this can cause pancreatitis so this is certainly a consideration.  3:07 AM IVF finished, pain has started to build again.  Will re-dose medication and reassess.  4:01 AM After second dose of meds, patient resting comfortably without current pain.  No vomiting here and he has not had any issues tolerating PO at home.  Discussed options of IP vs OP management, he would prefer to go home with symptomatic management.  Encouraged clear diet for the next 24-48 hours and advance as tolerated.  Avoid EtOH.  Will need close follow-up with GI.  Should symptoms worsen again, will need to return to the ED.  Final Clinical Impression(s) / ED Diagnoses Final diagnoses:  Epigastric abdominal pain    Rx / DC Orders ED Discharge Orders         Ordered    oxyCODONE-acetaminophen (PERCOCET) 5-325 MG tablet  Every 4 hours PRN        11/18/20 0403    ondansetron (ZOFRAN ODT) 4 MG disintegrating tablet  Every 8 hours PRN        11/18/20 0403           Larene Pickett, PA-C 11/18/20 0407    Margette Fast, MD 11/19/20 [phone]

## 2020-11-18 NOTE — Discharge Instructions (Addendum)
As we discussed, it appears you have pancreatitis.  Your ultrasound was normal so does not seem related to gallstones.  One of your medications, ozempic, can be associated with pancreatitis so may want to speak with your doctor about this. Follow the pancreatitis eating plan. Take the prescribed medication as directed. Follow-up with GI.  I would call Monday to get this scheduled. Return to the ED for new or worsening symptoms-- severe nausea/vomiting, uncontrolled pain, etc.

## 2020-12-01 ENCOUNTER — Ambulatory Visit: Payer: Medicare Other | Admitting: Family Medicine

## 2020-12-06 ENCOUNTER — Other Ambulatory Visit: Payer: Self-pay

## 2020-12-06 ENCOUNTER — Encounter: Payer: Self-pay | Admitting: Family Medicine

## 2020-12-06 ENCOUNTER — Ambulatory Visit (INDEPENDENT_AMBULATORY_CARE_PROVIDER_SITE_OTHER): Payer: Medicare Other | Admitting: Family Medicine

## 2020-12-06 DIAGNOSIS — I10 Essential (primary) hypertension: Secondary | ICD-10-CM | POA: Diagnosis not present

## 2020-12-06 DIAGNOSIS — K853 Drug induced acute pancreatitis without necrosis or infection: Secondary | ICD-10-CM | POA: Diagnosis not present

## 2020-12-06 NOTE — Patient Instructions (Signed)

## 2020-12-08 ENCOUNTER — Ambulatory Visit: Payer: Medicare Other

## 2020-12-08 ENCOUNTER — Ambulatory Visit (INDEPENDENT_AMBULATORY_CARE_PROVIDER_SITE_OTHER): Payer: Medicare Other | Admitting: Family Medicine

## 2020-12-08 DIAGNOSIS — Z0000 Encounter for general adult medical examination without abnormal findings: Secondary | ICD-10-CM

## 2020-12-08 NOTE — Patient Instructions (Addendum)
Diabetes Mellitus and Foot Care Foot care is an important part of your health, especially when you have diabetes. Diabetes may cause you to have problems because of poor blood flow (circulation) to your feet and legs, which can cause your skin to:  Become thinner and drier.  Break more easily.  Heal more slowly.  Peel and crack. You may also have nerve damage (neuropathy) in your legs and feet, causing decreased feeling in them. This means that you may not notice minor injuries to your feet that could lead to more serious problems. Noticing and addressing any potential problems early is the best way to prevent future foot problems. How to care for your feet Foot hygiene  Wash your feet daily with warm water and mild soap. Do not use hot water. Then, pat your feet and the areas between your toes until they are completely dry. Do not soak your feet as this can dry your skin.  Trim your toenails straight across. Do not dig under them or around the cuticle. File the edges of your nails with an emery board or nail file.  Apply a moisturizing lotion or petroleum jelly to the skin on your feet and to dry, brittle toenails. Use lotion that does not contain alcohol and is unscented. Do not apply lotion between your toes.   Shoes and socks  Wear clean socks or stockings every day. Make sure they are not too tight. Do not wear knee-high stockings since they may decrease blood flow to your legs.  Wear shoes that fit properly and have enough cushioning. Always look in your shoes before you put them on to be sure there are no objects inside.  To break in new shoes, wear them for just a few hours a day. This prevents injuries on your feet. Wounds, scrapes, corns, and calluses  Check your feet daily for blisters, cuts, bruises, sores, and redness. If you cannot see the bottom of your feet, use a mirror or ask someone for help.  Do not cut corns or calluses or try to remove them with medicine.  If  you find a minor scrape, cut, or break in the skin on your feet, keep it and the skin around it clean and dry. You may clean these areas with mild soap and water. Do not clean the area with peroxide, alcohol, or iodine.  If you have a wound, scrape, corn, or callus on your foot, look at it several times a day to make sure it is healing and not infected. Check for: ? Redness, swelling, or pain. ? Fluid or blood. ? Warmth. ? Pus or a bad smell.   General tips  Do not cross your legs. This may decrease blood flow to your feet.  Do not use heating pads or hot water bottles on your feet. They may burn your skin. If you have lost feeling in your feet or legs, you may not know this is happening until it is too late.  Protect your feet from hot and cold by wearing shoes, such as at the beach or on hot pavement.  Schedule a complete foot exam at least once a year (annually) or more often if you have foot problems. Report any cuts, sores, or bruises to your health care provider immediately. Where to find more information  American Diabetes Association: www.diabetes.org  Association of Diabetes Care & Education Specialists: www.diabeteseducator.org Contact a health care provider if:  You have a medical condition that increases your risk of infection  and you have any cuts, sores, or bruises on your feet.  You have an injury that is not healing.  You have redness on your legs or feet.  You feel burning or tingling in your legs or feet.  You have pain or cramps in your legs and feet.  Your legs or feet are numb.  Your feet always feel cold.  You have pain around any toenails. Get help right away if:  You have a wound, scrape, corn, or callus on your foot and: ? You have pain, swelling, or redness that gets worse. ? You have fluid or blood coming from the wound, scrape, corn, or callus. ? Your wound, scrape, corn, or callus feels warm to the touch. ? You have pus or a bad smell coming  from the wound, scrape, corn, or callus. ? You have a fever. ? You have a red line going up your leg. Summary  Check your feet every day for blisters, cuts, bruises, sores, and redness.  Apply a moisturizing lotion or petroleum jelly to the skin on your feet and to dry, brittle toenails.  Wear shoes that fit properly and have enough cushioning.  If you have foot problems, report any cuts, sores, or bruises to your health care provider immediately.  Schedule a complete foot exam at least once a year (annually) or more often if you have foot problems. This information is not intended to replace advice given to you by your health care provider. Make sure you discuss any questions you have with your health care provider. Document Revised: 04/13/2020 Document Reviewed: 04/13/2020 Elsevier Patient Education  [place] Maintenance, Male Adopting a healthy lifestyle and getting preventive care are important in promoting health and wellness. Ask your health care provider about:  The right schedule for you to have regular tests and exams.  Things you can do on your own to prevent diseases and keep yourself healthy. What should I know about diet, weight, and exercise? Eat a healthy diet  Eat a diet that includes plenty of vegetables, fruits, low-fat dairy products, and lean protein.  Do not eat a lot of foods that are high in solid fats, added sugars, or sodium.   Maintain a healthy weight Body mass index (BMI) is a measurement that can be used to identify possible weight problems. It estimates body fat based on height and weight. Your health care provider can help determine your BMI and help you achieve or maintain a healthy weight. Get regular exercise Get regular exercise. This is one of the most important things you can do for your health. Most adults should:  Exercise for at least 150 minutes each week. The exercise should increase your heart rate and make you sweat  (moderate-intensity exercise).  Do strengthening exercises at least twice a week. This is in addition to the moderate-intensity exercise.  Spend less time sitting. Even light physical activity can be beneficial. Watch cholesterol and blood lipids Have your blood tested for lipids and cholesterol at 64 years of age, then have this test every 5 years. You may need to have your cholesterol levels checked more often if:  Your lipid or cholesterol levels are high.  You are older than 64 years of age.  You are at high risk for heart disease. What should I know about cancer screening? Many types of cancers can be detected early and may often be prevented. Depending on your health history and family history, you may need to have  cancer screening at various ages. This may include screening for:  Colorectal cancer.  Prostate cancer.  Skin cancer.  Lung cancer. What should I know about heart disease, diabetes, and high blood pressure? Blood pressure and heart disease  High blood pressure causes heart disease and increases the risk of stroke. This is more likely to develop in people who have high blood pressure readings, are of African descent, or are overweight.  Talk with your health care provider about your target blood pressure readings.  Have your blood pressure checked: ? Every 3-5 years if you are 24-78 years of age. ? Every year if you are 46 years old or older.  If you are between the ages of 56 and 63 and are a current or former smoker, ask your health care provider if you should have a one-time screening for abdominal aortic aneurysm (AAA). Diabetes Have regular diabetes screenings. This checks your fasting blood sugar level. Have the screening done:  Once every three years after age 41 if you are at a normal weight and have a low risk for diabetes.  More often and at a younger age if you are overweight or have a high risk for diabetes. What should I know about preventing  infection? Hepatitis B If you have a higher risk for hepatitis B, you should be screened for this virus. Talk with your health care provider to find out if you are at risk for hepatitis B infection. Hepatitis C Blood testing is recommended for:  Everyone born from [birth year] through 1964.  Anyone with known risk factors for hepatitis C. Sexually transmitted infections (STIs)  You should be screened each year for STIs, including gonorrhea and chlamydia, if: ? You are sexually active and are younger than 64 years of age. ? You are older than 63 years of age and your health care provider tells you that you are at risk for this type of infection. ? Your sexual activity has changed since you were last screened, and you are at increased risk for chlamydia or gonorrhea. Ask your health care provider if you are at risk.  Ask your health care provider about whether you are at high risk for HIV. Your health care provider may recommend a prescription medicine to help prevent HIV infection. If you choose to take medicine to prevent HIV, you should first get tested for HIV. You should then be tested every 3 months for as long as you are taking the medicine. Follow these instructions at home: Lifestyle  Do not use any products that contain nicotine or tobacco, such as cigarettes, e-cigarettes, and chewing tobacco. If you need help quitting, ask your health care provider.  Do not use street drugs.  Do not share needles.  Ask your health care provider for help if you need support or information about quitting drugs. Alcohol use  Do not drink alcohol if your health care provider tells you not to drink.  If you drink alcohol: ? Limit how much you have to 0-2 drinks a day. ? Be aware of how much alcohol is in your drink. In the [place] one drink equals one 12 oz bottle of beer (355 mL), one 5 oz glass of wine (148 mL), or one 1 oz glass of hard liquor (44 mL). General instructions  Schedule regular health,  dental, and eye exams.  Stay current with your vaccines.  Tell your health care provider if: ? You often feel depressed. ? You have ever been abused or do not feel  safe at home. Summary  Adopting a healthy lifestyle and getting preventive care are important in promoting health and wellness.  Follow your health care provider's instructions about healthy diet, exercising, and getting tested or screened for diseases.  Follow your health care provider's instructions on monitoring your cholesterol and blood pressure. This information is not intended to replace advice given to you by your health care provider. Make sure you discuss any questions you have with your health care provider. Document Revised: 09/16/2018 Document Reviewed: 09/16/2018 Elsevier Patient Education  2021 Elsevier Inc.    MEDICARE [place] VISIT Health Maintenance Summary and Written Plan of Care  Erik Rose ,  Thank you for allowing me to perform your Medicare Annual Wellness Visit and for your ongoing commitment to your health.   Health Maintenance & Immunization History Health Maintenance  Topic Date Due  . Hepatitis C Screening  02/03/2021 (Originally 05-03-1956)  . COLONOSCOPY (Pts 45-43yrs Insurance coverage will need to be confirmed)  12/08/2021 (Originally 01/17/2019)  . HEMOGLOBIN A1C  04/18/2021  . OPHTHALMOLOGY EXAM  06/21/2021  . FOOT EXAM  12/06/2021  . TETANUS/TDAP  07/19/2030  . INFLUENZA VACCINE  Completed  . PNEUMOCOCCAL POLYSACCHARIDE VACCINE AGE 71-64 HIGH RISK  Completed  . COVID-19 Vaccine  Completed  . HIV Screening  Completed  . HPV VACCINES  Aged Out   Immunization History  Administered Date(s) Administered  . Influenza, Quadrivalent, Recombinant, Inj, Pf 07/17/2018  . Influenza,inj,Quad PF,6+ Mos 06/10/2017, 06/22/2019, 07/19/2020  . Influenza-Unspecified 06/10/2017  . PFIZER(Purple Top)SARS-COV-2 Vaccination 12/23/2019, 01/06/2020, 09/23/2020  . Pneumococcal  Polysaccharide-23 12/09/2018  . Tdap 07/19/2020  . Zoster Recombinat (Shingrix) 06/10/2017    These are the patient goals that we discussed: Goals Addressed              This Visit's Progress   .  Patient Stated (pt-stated)        12/08/2020 AWV Goal: Exercise for General Health   Patient will verbalize understanding of the benefits of increased physical activity:  Exercising regularly is important. It will improve your overall fitness, flexibility, and endurance.  Regular exercise also will improve your overall health. It can help you control your weight, reduce stress, and improve your bone density.  Over the next year, patient will increase physical activity as tolerated with a goal of at least 150 minutes of moderate physical activity per week.   You can tell that you are exercising at a moderate intensity if your heart starts beating faster and you start breathing faster but can still hold a conversation.  Moderate-intensity exercise ideas include:  Walking 1 mile (1.6 km) in about 15 minutes  Biking  Hiking  Golfing  Dancing  Water aerobics  Patient will verbalize understanding of everyday activities that increase physical activity by providing examples like the following: ? Yard work, such as: ? Pushing a Surveyor, mining ? Raking and bagging leaves ? Washing your car ? Pushing a stroller ? Shoveling snow ? Gardening ? Washing windows or floors  Patient will be able to explain general safety guidelines for exercising:   Before you start a new exercise program, talk with your health care provider.  Do not exercise so much that you hurt yourself, feel dizzy, or get very short of breath.  Wear comfortable clothes and wear shoes with good support.  Drink plenty of water while you exercise to prevent dehydration or heat stroke.  Work out until your breathing and your heartbeat get faster.  This is a list of Health Maintenance Items that are overdue  or due now: There are no preventive care reminders to display for this patient.   Orders/Referrals Placed Today: No orders of the defined types were placed in this encounter.  Follow-up Plan . Follow-up with Everrett Coombe, DO as planned . Discuss referral to Podiatrist . Hepatitis C screening with the next lab orders.

## 2020-12-08 NOTE — Progress Notes (Signed)
MEDICARE ANNUAL WELLNESS VISIT  12/08/2020  Telephone Visit Disclaimer This Medicare AWV was conducted by telephone due to national recommendations for restrictions regarding the COVID-19 Pandemic (e.g. social distancing).  I verified, using two identifiers, that I am speaking with Erik Rose or their authorized healthcare agent. I discussed the limitations, risks, security, and privacy concerns of performing an evaluation and management service by telephone and the potential availability of an in-person appointment in the future. The patient expressed understanding and agreed to proceed.  Location of Patient: Home  Location of Provider (nurse):  In the office.  Subjective:    Erik Rose is a 65 y.o. male patient of [name], DO who had a Medicare Annual Wellness Visit today via telephone. [name] is Retired and Disabled and lives alone. he has 4 children. he reports that he is socially active and does interact with friends/family regularly. he is moderately physically active and enjoys playing guitar and golf once a month.  Patient Care Team: [name], DO as PCP - General (Family Medicine) Freada Bergeron, MD as PCP - Cardiology (Cardiology)  Advanced Directives 12/08/2020 11/18/2020  Does Patient Have a Medical Advance Directive? No No  Would patient like information on creating a medical advance directive? No - Patient declined [facility] Utilization Over the Past 12 Months: # of hospitalizations or ER visits: 1 # of surgeries: 0  Review of Systems    Patient reports that his overall health is better compared to last year.  History obtained from chart review and the patient  Patient Reported Readings (BP, Pulse, CBG, Weight, etc) none  Pain Assessment Pain : No/denies pain Pain Score: 0-No pain     Current Medications & Allergies (verified) Allergies as of 12/08/2020   No Known Allergies     Medication List       Accurate as of  December 08, 2020 10:52 AM. If you have any questions, ask your nurse or doctor.        aspirin EC 81 MG tablet Take 1 tablet (81 mg total) by mouth daily. Swallow whole.   atorvastatin 80 MG tablet Commonly known as: LIPITOR Take 1 tablet (80 mg total) by mouth daily.   carvedilol 12.5 MG tablet Commonly known as: COREG Take 1 tablet (12.5 mg total) by mouth 2 (two) times daily with a meal.   gabapentin 300 MG capsule Commonly known as: NEURONTIN Take 1 capsule (300 mg total) by mouth 3 (three) times daily. For nerve pain   HumaLOG KwikPen 100 UNIT/ML KwikPen Generic drug: insulin lispro INJECT 15 UNITS INTO THE SKIN 3 TIMES DAILY   Lantus SoloStar 100 UNIT/ML Solostar Pen Generic drug: insulin glargine ADMINISTER 55 UNITS UNDER THE SKIN DAILY   lisinopril 40 MG tablet Commonly known as: ZESTRIL Take 1 tablet (40 mg total) by mouth daily.   NEXIUM PO Take by mouth.   ondansetron 4 MG disintegrating tablet Commonly known as: Zofran ODT Take 1 tablet (4 mg total) by mouth every 8 (eight) hours as needed for nausea.   ONE TOUCH ULTRA 2 w/Device Kit Use to take blood sugar one to two times daily   OneTouch Ultra test strip Generic drug: glucose blood USE TO CHECK BLOOD SUGAR TWICE DAILY   OVER THE COUNTER MEDICATION Take 1 tablet by mouth daily. ALLERGY PILL   oxyCODONE-acetaminophen 5-325 MG tablet Commonly known as: Percocet Take 1 tablet by mouth every 4 (four) hours as needed.       History (  reviewed): Past Medical History:  Diagnosis Date  . Diabetes mellitus without complication ([place])   . Diverticulitis   . GERD (gastroesophageal reflux disease)   . Heart disease   . Hypertension   . Stroke ([place])   . TIA (transient ischemic attack)    Past Surgical History:  Procedure Laterality Date  . CARDIAC CATHETERIZATION    . CORONARY ANGIOPLASTY WITH STENT PLACEMENT  2010   Family History  Problem Relation Age of Onset  . Hypertension Mother   . Kidney  disease Mother   . Miscarriages / [place] Mother   . Stroke Mother   . Heart disease Father   . Depression Sister   . Kidney disease Sister   . Diabetes Brother   . Hypertension Brother   . Hyperlipidemia Brother   . Kidney disease Brother    Social History   Socioeconomic History  . Marital status: Married    Spouse name: [name]  . Number of children: 4  . Years of education: 39  . Highest education level: Associate degree: academic program  Occupational History    Comment: Disability/retired.  Tobacco Use  . Smoking status: Current Some Day Smoker    Packs/day: 0.50  . Smokeless tobacco: Never Used  Vaping Use  . Vaping Use: Never used  Substance and Sexual Activity  . Alcohol use: Not Currently    Comment: quit 30 yrs ago  . Drug use: Not Currently    Types: Marijuana, Cocaine    Comment: stopped 1991  . Sexual activity: Not on file  Other Topics Concern  . 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    Social Determinants of Health   Financial Resource Strain: Low Risk   . Difficulty of Paying Living Expenses: Not hard at all  Food Insecurity: No Food Insecurity  . Worried About Charity fundraiser in the Last Year: Never true  . Ran Out of Food in the Last Year: Never true  Transportation Needs: No Transportation Needs  . Lack of Transportation (Medical): No  . Lack of Transportation (Non-Medical): No  Physical Activity: Sufficiently Active  . Days of Exercise per Week: 7 days  . Minutes of Exercise per Session: 120 min  Stress: No Stress Concern Present  . Feeling of Stress : Not at all  Social Connections: Moderately Isolated  . Frequency of Communication with Friends and Family: More than three times a week  . Frequency of Social Gatherings with Friends and Family: More than three times a week  . Attends Religious Services: Never  . Active Member of Clubs or Organizations: No   . Attends Archivist Meetings: Never  . Marital Status: Married    Activities of Daily Living In your present state of health, do you have any difficulty performing the following activities: 12/08/2020 01/18/2020  Hearing? N N  Vision? N N  Difficulty concentrating or making decisions? N N  Walking or climbing stairs? Y N  Comment neuropathic pain -  Dressing or bathing? N N  Doing errands, shopping? N N  Preparing Food and eating ? N -  Using the Toilet? N -  In the past six months, have you accidently leaked urine? N -  Do you have problems with loss of bowel control? N -  Managing your Medications? N -  Managing your Finances? N -  Housekeeping or managing your Housekeeping? N -  Some recent data might be hidden    Patient Education/ Literacy How often do you need to have someone help you when you read instructions, pamphlets, or other written materials from your doctor or pharmacy?: 1 - Never What is the last grade level you completed in school?: Associates Degree  Exercise Current Exercise Habits: Home exercise routine, Type of exercise: Other - see comments (reconstructing houses), Time (Minutes): > 60, Frequency (Times/Week): >7, Weekly Exercise (Minutes/Week): 0, Intensity: Moderate, Exercise limited by: None identified  Diet Patient reports consuming 2 meals a day and 2 snack(s) a day Patient reports that his primary diet is: Regular Patient reports that she does have regular access to food.   Depression Screen PHQ 2/9 Scores 12/08/2020 09/09/2018  PHQ - 2 Score 0 0  PHQ- 9 Score 0 -     Fall Risk Fall Risk  12/08/2020 09/09/2018  Falls in the past year? 0 0  Number falls in past yr: 0 -  Injury with Fall? 0 -  Risk for fall due to : No Fall Risks -  Follow up Falls evaluation completed -     Objective:  Erik Rose seemed alert and oriented and he participated appropriately during our telephone visit.  Blood Pressure Weight BMI  BP Readings  from Last 3 Encounters:  12/06/20 (!) 150/83  11/18/20 (!) 148/125  10/19/20 (!) 154/84   Wt Readings from Last 3 Encounters:  12/06/20 207 lb 6.4 oz (94.1 kg)  11/17/20 215 lb (97.5 kg)  10/19/20 214 lb (97.1 kg)   BMI Readings from Last 1 Encounters:  12/06/20 29.76 kg/m    *Unable to obtain current vital signs, weight, and BMI due to telephone visit type  Hearing/Vision  . Elizabeth did not seem to have difficulty with hearing/understanding during the telephone conversation . Reports that he has had a formal eye exam by an eye care professional within the past year . Reports that he has not had a formal hearing evaluation within the past year *Unable to fully assess hearing and vision during telephone visit type  Cognitive Function: 6CIT Screen 12/08/2020  What Year? 0 points  What month? 0 points  What time? 0 points  Count back from 20 0 points  Months in reverse 0 points  Repeat phrase 0 points  Total Score 0   (Normal:0-7, Significant for Dysfunction: >8)  Normal Cognitive Function Screening: Yes   Immunization & Health Maintenance Record Immunization History  Administered Date(s) Administered  . Influenza, Quadrivalent, Recombinant, Inj, Pf 07/17/2018  . Influenza,inj,Quad PF,6+ Mos 06/10/2017, 06/22/2019, 07/19/2020  . Influenza-Unspecified 06/10/2017  . PFIZER(Purple Top)SARS-COV-2 Vaccination 12/23/2019, 01/06/2020, 09/23/2020  . Pneumococcal Polysaccharide-23 12/09/2018  . Tdap 07/19/2020  . Zoster Recombinat (Shingrix) 06/10/2017    Health Maintenance  Topic Date Due  . Hepatitis C Screening  02/03/2021 (Originally January 19, 1956)  . COLONOSCOPY (Pts 45-66yr Insurance coverage will need to be confirmed)  12/08/2021 (Originally 01/17/2019)  . HEMOGLOBIN A1C  04/18/2021  . OPHTHALMOLOGY EXAM  06/21/2021  . FOOT EXAM  12/06/2021  . TETANUS/TDAP  07/19/2030  . INFLUENZA VACCINE  Completed  . PNEUMOCOCCAL POLYSACCHARIDE VACCINE AGE 30-64 HIGH RISK  Completed  .  COVID-19 Vaccine  Completed  . HIV Screening  Completed  . HPV VACCINES  Aged Out       Assessment  This is a routine wellness examination for [facility]  Health Maintenance: Due or Overdue There are no preventive care reminders to display for this patient.  [facility] not  need a referral for Community Assistance: Care Management:   no Social Work:    no Prescription Assistance:  no Nutrition/Diabetes Education:  no   Plan:  Personalized Goals Goals Addressed              This Visit's Progress   .  Patient Stated (pt-stated)        12/08/2020 AWV Goal: Exercise for General Health   Patient will verbalize understanding of the benefits of increased physical activity:  Exercising regularly is important. It will improve your overall fitness, flexibility, and endurance.  Regular exercise also will improve your overall health. It can help you control your weight, reduce stress, and improve your bone density.  Over the next year, patient will increase physical activity as tolerated with a goal of at least 150 minutes of moderate physical activity per week.   You can tell that you are exercising at a moderate intensity if your heart starts beating faster and you start breathing faster but can still hold a conversation.  Moderate-intensity exercise ideas include:  Walking 1 mile (1.6 km) in about 15 minutes  Biking  Hiking  Golfing  Dancing  Water aerobics  Patient will verbalize understanding of everyday activities that increase physical activity by providing examples like the following: ? Yard work, such as: ? Pushing a Conservation officer, nature ? Raking and bagging leaves ? Washing your car ? Pushing a stroller ? Shoveling snow ? Gardening ? Washing windows or floors  Patient will be able to explain general safety guidelines for exercising:   Before you start a new exercise program, talk with your health care provider.  Do not exercise so much that  you hurt yourself, feel dizzy, or get very short of breath.  Wear comfortable clothes and wear shoes with good support.  Drink plenty of water while you exercise to prevent dehydration or heat stroke.  Work out until your breathing and your heartbeat get faster.       Personalized Health Maintenance & Screening Recommendations  Up to date  Lung Cancer Screening Recommended: no (Low Dose CT Chest recommended if Age 64-80 years, 30 pack-year currently smoking OR have quit w/in past 15 years) Hepatitis C Screening recommended: yes HIV Screening recommended: no  Advanced Directives: Written information was not prepared per patient's request.  Referrals & Orders No orders of the defined types were placed in this encounter.   Follow-up Plan . Follow-up with [name], DO as planned . Discuss referral to Podiatrist . Hepatitis C screening with the next lab orders.   I have personally reviewed and noted the following in the patient's chart:   . Medical and social history . Use of alcohol, tobacco or illicit drugs  . Current medications and supplements . Functional ability and status . Nutritional status . Physical activity . Advanced directives . List of other physicians . Hospitalizations, surgeries, and ER visits in previous 12 months . Vitals . Screenings to include cognitive, depression, and falls . Referrals and appointments  In addition, I have reviewed and discussed with Erik Rose certain preventive protocols, quality metrics, and best practice recommendations. A written personalized care plan for preventive services as well as general preventive health recommendations is available and can be mailed to the patient at his request.      Tinnie Gens, RN  12/08/2020

## 2020-12-10 DIAGNOSIS — K859 Acute pancreatitis without necrosis or infection, unspecified: Secondary | ICD-10-CM | POA: Insufficient documentation

## 2020-12-10 NOTE — Assessment & Plan Note (Signed)
Pancreatitis likely due to Ozempic.  He will remain off of this.   He denies continued pain and appetite is normal.

## 2020-12-10 NOTE — Assessment & Plan Note (Signed)
BP elevated today.  Readings at home improved.  He will continue to monitor at home and send me readings.  Instructed on low sodium diet and regular exercise.

## 2020-12-10 NOTE — Progress Notes (Signed)
Erik Rose - 65 y.o. male MRN [id number]  Date of birth: [birth year]  Subjective Chief Complaint  Patient presents with  . Diabetes  . Hypertension    HPI Erik Rose is a 65 y.o. male here today for follow up of recent ED visit.  He was seen in the ED a couple of weeks ago for upper abdominal pain that radiated to his back.  Labs with elevated lipase to 1093 with normal LFT's.  TG in 12/ 21 were 63.  He does not consume EtOH.  No gallstones noted on recent US.  He did start Ozempic recently to help with management of diabetes.  He reports that pain has resolved at this point and he is not taking Ozempic.  He denies nausea or vomiting at this time.    Recent blood sugars around 115-140.  He is using insulin as prescribed.    ROS:  A comprehensive ROS was completed and negative except as noted per HPI  No Known Allergies  Past Medical History:  Diagnosis Date  . Diabetes mellitus without complication ([facility])   . Diverticulitis   . GERD (gastroesophageal reflux disease)   . Heart disease   . Hypertension   . Stroke ([facility])   . TIA (transient ischemic attack)     Past Surgical History:  Procedure Laterality Date  . CARDIAC CATHETERIZATION    . CORONARY ANGIOPLASTY WITH STENT PLACEMENT  2010    Social History   Socioeconomic History  . Marital status: Married    Spouse name: [name]  . Number of children: 4  . Years of education: 48  . Highest education level: Associate degree: academic program  Occupational History    Comment: Disability/retired.  Tobacco Use  . Smoking status: Current Some Day Smoker    Packs/day: 0.50  . Smokeless tobacco: Never Used  Vaping Use  . Vaping Use: Never used  Substance and Sexual Activity  . Alcohol use: Not Currently    Comment: quit 30 yrs ago  . Drug use: Not Currently    Types: Marijuana, Cocaine    Comment: stopped 1991  . Sexual activity: Not on file  Other Topics Concern  . 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    Social Determinants of Health   Financial Resource Strain: Low Risk   . Difficulty of Paying Living Expenses: Not hard at all  Food Insecurity: No Food Insecurity  . Worried About Programme researcher, broadcasting/film/video in the Last Year: Never true  . Ran Out of Food in the Last Year: Never true  Transportation Needs: No Transportation Needs  . Lack of Transportation (Medical): No  . Lack of Transportation (Non-Medical): No  Physical Activity: Sufficiently Active  . Days of Exercise per Week: 7 days  . Minutes of Exercise per Session: 120 min  Stress: No Stress Concern Present  . Feeling of Stress : Not at all  Social Connections: Moderately Isolated  . Frequency of Communication with Friends and Family: More than three times a week  . Frequency of Social Gatherings with Friends and Family: More than three times a week  . Attends Religious Services: Never  . Active Member of Clubs or Organizations: No  . Attends Banker Meetings: Never  . Marital Status: Married    Family History  Problem Relation Age of Onset  . Hypertension Mother   . Kidney disease Mother   .  Miscarriages / [place] Mother   . Stroke Mother   . Heart disease Father   . Depression Sister   . Kidney disease Sister   . Diabetes Brother   . Hypertension Brother   . Hyperlipidemia Brother   . Kidney disease Brother     Health Maintenance  Topic Date Due  . Hepatitis C Screening  02/03/2021 (Originally [birth year])  . COLONOSCOPY (Pts 45-24yrs Insurance coverage will need to be confirmed)  12/08/2021 (Originally 01/17/2019)  . HEMOGLOBIN A1C  04/18/2021  . OPHTHALMOLOGY EXAM  06/21/2021  . FOOT EXAM  12/06/2021  . TETANUS/TDAP  07/19/2030  . INFLUENZA VACCINE  Completed  . PNEUMOCOCCAL POLYSACCHARIDE VACCINE AGE 68-64 HIGH RISK  Completed  . COVID-19 Vaccine  Completed  . HIV Screening  Completed  . HPV VACCINES  Aged Out      ----------------------------------------------------------------------------------------------------------------------------------------------------------------------------------------------------------------- Physical Exam BP (!) 150/83 (BP Location: Left Arm, Patient Position: Sitting, Cuff Size: Large)   Pulse 88   Temp 98 F (36.7 C)   Wt 207 lb 6.4 oz (94.1 kg)   SpO2 100%   BMI 29.76 kg/m   Physical Exam Constitutional:      Appearance: Normal appearance.  Eyes:     General: No scleral icterus. Cardiovascular:     Rate and Rhythm: Normal rate and regular rhythm.  Pulmonary:     Effort: Pulmonary effort is normal.     Breath sounds: Normal breath sounds.  Abdominal:     General: Abdomen is flat. There is no distension.     Palpations: Abdomen is soft.     Tenderness: There is no abdominal tenderness.  Musculoskeletal:     Cervical back: Neck supple.  Skin:    General: Skin is warm and dry.  Neurological:     General: No focal deficit present.     Mental Status: He is alert.  Psychiatric:        Mood and Affect: Mood normal.        Behavior: Behavior normal.     ------------------------------------------------------------------------------------------------------------------------------------------------------------------------------------------------------------------- Assessment and Plan  Essential hypertension BP elevated today.  Readings at home improved.  He will continue to monitor at home and send me readings.  Instructed on low sodium diet and regular exercise.    Pancreatitis Pancreatitis likely due to Ozempic.  He will remain off of this.   He denies continued pain and appetite is normal.   No orders of the defined types were placed in this encounter.   Return in about 2 months (around 02/05/2021) for HTN/DM.    This visit occurred during the SARS-CoV-2 public health emergency.  Safety protocols were in place, including screening questions  prior to the visit, additional usage of staff PPE, and extensive cleaning of exam room while observing appropriate contact time as indicated for disinfecting solutions.

## 2020-12-14 ENCOUNTER — Telehealth: Payer: Self-pay

## 2020-12-14 ENCOUNTER — Other Ambulatory Visit: Payer: Self-pay

## 2020-12-14 DIAGNOSIS — Z794 Long term (current) use of insulin: Secondary | ICD-10-CM

## 2020-12-14 DIAGNOSIS — E1165 Type 2 diabetes mellitus with hyperglycemia: Secondary | ICD-10-CM

## 2020-12-14 MED ORDER — LANTUS SOLOSTAR 100 UNIT/ML ~~LOC~~ SOPN: PEN_INJECTOR | SUBCUTANEOUS | 3 refills | Status: DC

## 2020-12-14 MED ORDER — INSULIN LISPRO (1 UNIT DIAL) 100 UNIT/ML (KWIKPEN): PEN_INJECTOR | SUBCUTANEOUS | 3 refills | Status: DC

## 2020-12-14 NOTE — Telephone Encounter (Signed)
Received documentation from [facility] requesting Rx for Lantus.   Printed Rx and faxed to [employer] [phone]).

## 2020-12-14 NOTE — Telephone Encounter (Signed)
Correction: Rx for Humalog 100unit.

## 2021-01-12 ENCOUNTER — Telehealth: Payer: Self-pay | Admitting: Family Medicine

## 2021-01-12 NOTE — Telephone Encounter (Signed)
Patient dropped off [employer] patient assistance forms for PCP to fill out. Billing form attached to forms & forms placed in provider box. ANM *01/12/2021*

## 2021-01-14 NOTE — Telephone Encounter (Signed)
See result note.  

## 2021-01-17 ENCOUNTER — Telehealth: Payer: Self-pay

## 2021-01-17 ENCOUNTER — Ambulatory Visit: Payer: Medicare Other | Admitting: Family Medicine

## 2021-01-17 NOTE — Telephone Encounter (Signed)
[employer] documentation completed by Dr. Ashley Royalty.   Faxed application to [employer].   Please scan the documents to chart and contact the patient to advise of completion.   Thanks

## 2021-01-23 ENCOUNTER — Other Ambulatory Visit: Payer: Self-pay | Admitting: Family Medicine

## 2021-01-23 DIAGNOSIS — Z794 Long term (current) use of insulin: Secondary | ICD-10-CM

## 2021-01-23 DIAGNOSIS — E119 Type 2 diabetes mellitus without complications: Secondary | ICD-10-CM

## 2021-02-05 ENCOUNTER — Other Ambulatory Visit: Payer: Self-pay

## 2021-02-05 ENCOUNTER — Encounter: Payer: Self-pay | Admitting: Family Medicine

## 2021-02-05 ENCOUNTER — Ambulatory Visit (INDEPENDENT_AMBULATORY_CARE_PROVIDER_SITE_OTHER): Payer: Medicare Other | Admitting: Family Medicine

## 2021-02-05 VITALS — BP 158/83 | HR 83 | Temp 97.7°F | Ht 70.0 in | Wt 210.4 lb

## 2021-02-05 DIAGNOSIS — I10 Essential (primary) hypertension: Secondary | ICD-10-CM

## 2021-02-05 DIAGNOSIS — G959 Disease of spinal cord, unspecified: Secondary | ICD-10-CM | POA: Diagnosis not present

## 2021-02-05 DIAGNOSIS — E782 Mixed hyperlipidemia: Secondary | ICD-10-CM | POA: Diagnosis not present

## 2021-02-05 DIAGNOSIS — E1165 Type 2 diabetes mellitus with hyperglycemia: Secondary | ICD-10-CM | POA: Diagnosis not present

## 2021-02-05 DIAGNOSIS — M5412 Radiculopathy, cervical region: Secondary | ICD-10-CM | POA: Diagnosis not present

## 2021-02-05 DIAGNOSIS — Z794 Long term (current) use of insulin: Secondary | ICD-10-CM

## 2021-02-05 LAB — POCT GLYCOSYLATED HEMOGLOBIN (HGB A1C): Hemoglobin A1C: 8.9 % — AB (ref 4.0–5.6)

## 2021-02-05 MED ORDER — AMLODIPINE BESYLATE 5 MG PO TABS: 5.0000 mg | ORAL_TABLET | Freq: Every day | ORAL | 3 refills | Status: DC

## 2021-02-05 NOTE — Patient Instructions (Addendum)
Great to see you today! Add amlodipine 5mg  daily for blood pressure.  Change lantus to 30 units twice per day.  Continue novolog/humalog at 15 units with meals.  Follow a low carbohydrate diet.  See me again in 3 months.

## 2021-02-06 ENCOUNTER — Encounter: Payer: Self-pay | Admitting: Family Medicine

## 2021-02-06 NOTE — Assessment & Plan Note (Signed)
Lab Results  Component Value Date   LDLCALC 72 09/14/2020  Tolerating atorvastatin well, continue.

## 2021-02-06 NOTE — Assessment & Plan Note (Signed)
Most recent A1c of  Lab Results  Component Value Date   HGBA1C 8.9 (A) 02/05/2021   indicates diabetes is not well controlled.  he  will increase lantus to 60 units split to 30 units BID.  Counseled on healthy, low carb diet and recommend frequent activity to help with maintaining good control of blood sugars.

## 2021-02-06 NOTE — Progress Notes (Signed)
Erik Rose - 65 y.o. male MRN [id number]  Date of birth: Jan 07, 1956  Subjective Chief Complaint  Patient presents with  . Diabetes  . Hypertension    HPI Erik Rose is a 65 y.o. male here today for follow up visit.   He has history fo T2DM thast is currently treated with lantus 55 units daily and humalog 15 units TID.  He was taking ozempic however developed pancreatitis shortly after starting this.  He reports blood sugars at home are around 140-150 but isn't checking too often.  He denies symptoms of hypoglycemia.  Currently receiving lantus through patient assistance program. He does have some associated neuropathy with improvement using gabapentin.    BP managed with coreg and lisinopril.  He is taking daily and tolerating well.  He denies side effects from medication.  No monitoring of BP at home.  He denies chest pain, shortness of breath, palpitations, headache or vision changes.   He is still waiting to hear from neurosurgery  Related to cervical radiculopathy/myelopathy.  Original referral placed by neurology. Currently managed with gabapentin.     ROS:  A comprehensive ROS was completed and negative except as noted per HPI  No Known Allergies  Past Medical History:  Diagnosis Date  . Diabetes mellitus without complication (HCC)   . Diverticulitis   . GERD (gastroesophageal reflux disease)   . Heart disease   . Hypertension   . Stroke (HCC)   . TIA (transient ischemic attack)     Past Surgical History:  Procedure Laterality Date  . CARDIAC CATHETERIZATION    . CORONARY ANGIOPLASTY WITH STENT PLACEMENT  2010    Social History   Socioeconomic History  . Marital status: Married    Spouse name: [name]  . Number of children: 4  . Years of education: 36  . Highest education level: Associate degree: academic program  Occupational History    Comment: Disability/retired.  Tobacco Use  . Smoking status: Current Some Day Smoker    Packs/day: 0.50  .  Smokeless tobacco: Never Used  Vaping Use  . Vaping Use: Never used  Substance and Sexual Activity  . Alcohol use: Not Currently    Comment: quit 30 yrs ago  . Drug use: Not Currently    Types: Marijuana, Cocaine    Comment: stopped 1991  . Sexual activity: Not on file  Other Topics Concern  . Not on file  Social History Narrative   Live alone. 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 Likes to [employer] range once a month.    Social Determinants of Health   Financial Resource Strain: Low Risk   . Difficulty of Paying Living Expenses: Not hard at all  Food Insecurity: No Food Insecurity  . Worried About Programme researcher, broadcasting/film/video in the Last Year: Never true  . Ran Out of Food in the Last Year: Never true  Transportation Needs: No Transportation Needs  . Lack of Transportation (Medical): No  . Lack of Transportation (Non-Medical): No  Physical Activity: Sufficiently Active  . Days of Exercise per Week: 7 days  . Minutes of Exercise per Session: 120 min  Stress: No Stress Concern Present  . Feeling of Stress : Not at all  Social Connections: Moderately Isolated  . Frequency of Communication with Friends and Family: More than three times a week  . Frequency of Social Gatherings with Friends and Family: More than three times a week  . Attends Religious Services: Never  . Active Member of Clubs or  Organizations: No  . Attends Banker Meetings: Never  . Marital Status: Married    Family History  Problem Relation Age of Onset  . Hypertension Mother   . Kidney disease Mother   . Miscarriages / [place] Mother   . Stroke Mother   . Heart disease Father   . Depression Sister   . Kidney disease Sister   . Diabetes Brother   . Hypertension Brother   . Hyperlipidemia Brother   . Kidney disease Brother     Health Maintenance  Topic Date Due  . Hepatitis C Screening  Never done  . COLONOSCOPY (Pts 45-57yrs Insurance coverage will need to be confirmed)  12/08/2021 (Originally  01/17/2019)  . INFLUENZA VACCINE  05/07/2021  . OPHTHALMOLOGY EXAM  06/21/2021  . HEMOGLOBIN A1C  08/08/2021  . FOOT EXAM  12/06/2021  . TETANUS/TDAP  07/19/2030  . PNEUMOCOCCAL POLYSACCHARIDE VACCINE AGE 71-64 HIGH RISK  Completed  . COVID-19 Vaccine  Completed  . HIV Screening  Completed  . HPV VACCINES  Aged Out     ----------------------------------------------------------------------------------------------------------------------------------------------------------------------------------------------------------------- Physical Exam BP (!) 158/83 (BP Location: Left Arm, Patient Position: Sitting, Cuff Size: Large)   Pulse 83   Temp 97.7 F (36.5 C)   Ht 5\' 10"  (1.778 m)   Wt 210 lb 6.4 oz (95.4 kg)   SpO2 100%   BMI 30.19 kg/m   Physical Exam Constitutional:      Appearance: Normal appearance.  HENT:     Head: Normocephalic and atraumatic.  Eyes:     General: No scleral icterus. Cardiovascular:     Rate and Rhythm: Normal rate and regular rhythm.  Pulmonary:     Effort: Pulmonary effort is normal.     Breath sounds: Normal breath sounds.  Musculoskeletal:     Cervical back: Neck supple.  Neurological:     General: No focal deficit present.     Mental Status: He is alert.  Psychiatric:        Mood and Affect: Mood normal.        Behavior: Behavior normal.     ------------------------------------------------------------------------------------------------------------------------------------------------------------------------------------------------------------------- Assessment and Plan  Essential hypertension Blood pressure is not at goal at for age and co-morbidities.  I recommend addition of amlodipine 5mg  to coreg and lisinopril.  In addition they were instructed to follow a low sodium diet with regular exercise to help to maintain adequate control of blood pressure.    Type 2 diabetes mellitus with hyperglycemia, with long-term current use of insulin  (HCC) Most recent A1c of  Lab Results  Component Value Date   HGBA1C 8.9 (A) 02/05/2021   indicates diabetes is not well controlled.  he  will increase lantus to 60 units split to 30 units BID.  Counseled on healthy, low carb diet and recommend frequent activity to help with maintaining good control of blood sugars.    Cervical radiculopathy New neurosurgery referral entered.   Mixed hyperlipidemia Lab Results  Component Value Date   LDLCALC 72 09/14/2020  Tolerating atorvastatin well, continue.    Meds ordered this encounter  Medications  . amLODipine (NORVASC) 5 MG tablet    Sig: Take 1 tablet (5 mg total) by mouth daily.    Dispense:  90 tablet    Refill:  3    Return in about 3 months (around 05/08/2021) for HTN/DM.    This visit occurred during the SARS-CoV-2 public health emergency.  Safety protocols were in place, including screening questions prior to the visit, additional usage of  staff PPE, and extensive cleaning of exam room while observing appropriate contact time as indicated for disinfecting solutions.

## 2021-02-06 NOTE — Assessment & Plan Note (Signed)
New neurosurgery referral entered.

## 2021-02-06 NOTE — Assessment & Plan Note (Signed)
Blood pressure is not at goal at for age and co-morbidities.  I recommend addition of amlodipine 5mg  to coreg and lisinopril.  In addition they were instructed to follow a low sodium diet with regular exercise to help to maintain adequate control of blood pressure.

## 2021-02-14 DIAGNOSIS — I10 Essential (primary) hypertension: Secondary | ICD-10-CM | POA: Diagnosis not present

## 2021-02-14 DIAGNOSIS — G5601 Carpal tunnel syndrome, right upper limb: Secondary | ICD-10-CM | POA: Diagnosis not present

## 2021-02-14 DIAGNOSIS — G9519 Other vascular myelopathies: Secondary | ICD-10-CM | POA: Diagnosis not present

## 2021-02-16 ENCOUNTER — Other Ambulatory Visit: Payer: Self-pay | Admitting: Neurosurgery

## 2021-02-16 DIAGNOSIS — G9519 Other vascular myelopathies: Secondary | ICD-10-CM

## 2021-02-22 ENCOUNTER — Other Ambulatory Visit: Payer: Self-pay

## 2021-02-22 ENCOUNTER — Ambulatory Visit
Admission: RE | Admit: 2021-02-22 | Discharge: 2021-02-22 | Disposition: A | Discharge status: Home / Self Care | Payer: Medicare Other | Source: Ambulatory Visit | Attending: Neurosurgery | Admitting: Neurosurgery

## 2021-02-22 DIAGNOSIS — M48061 Spinal stenosis, lumbar region without neurogenic claudication: Secondary | ICD-10-CM | POA: Diagnosis not present

## 2021-02-22 DIAGNOSIS — M545 Low back pain, unspecified: Secondary | ICD-10-CM | POA: Diagnosis not present

## 2021-02-22 DIAGNOSIS — G9519 Other vascular myelopathies: Secondary | ICD-10-CM

## 2021-02-28 DIAGNOSIS — G5601 Carpal tunnel syndrome, right upper limb: Secondary | ICD-10-CM | POA: Diagnosis not present
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
## 2021-03-12 ENCOUNTER — Other Ambulatory Visit: Payer: Self-pay

## 2021-03-12 ENCOUNTER — Ambulatory Visit: Payer: Medicare Other | Admitting: Cardiology

## 2021-03-12 ENCOUNTER — Encounter: Payer: Self-pay | Admitting: Cardiology

## 2021-03-12 VITALS — BP 130/80 | HR 83 | Ht 70.0 in | Wt 211.6 lb

## 2021-03-12 DIAGNOSIS — E7800 Pure hypercholesterolemia, unspecified: Secondary | ICD-10-CM

## 2021-03-12 DIAGNOSIS — I10 Essential (primary) hypertension: Secondary | ICD-10-CM

## 2021-03-12 DIAGNOSIS — M79604 Pain in right leg: Secondary | ICD-10-CM

## 2021-03-12 DIAGNOSIS — E782 Mixed hyperlipidemia: Secondary | ICD-10-CM | POA: Diagnosis not present

## 2021-03-12 DIAGNOSIS — Z794 Long term (current) use of insulin: Secondary | ICD-10-CM | POA: Diagnosis not present

## 2021-03-12 DIAGNOSIS — M79605 Pain in left leg: Secondary | ICD-10-CM

## 2021-03-12 DIAGNOSIS — E1165 Type 2 diabetes mellitus with hyperglycemia: Secondary | ICD-10-CM | POA: Diagnosis not present

## 2021-03-12 DIAGNOSIS — I2510 Atherosclerotic heart disease of native coronary artery without angina pectoris: Secondary | ICD-10-CM

## 2021-03-12 NOTE — Patient Instructions (Signed)
Medication Instructions:   Your physician recommends that you continue on your current medications as directed. Please refer to the Current Medication list given to you today.  *If you need a refill on your cardiac medications before your next appointment, please call your pharmacy*   Follow-Up:  8 MONTHS IN THE OFFICE WITH DR. Shari Prows

## 2021-03-15 ENCOUNTER — Encounter: Payer: Self-pay | Admitting: Family Medicine

## 2021-03-15 ENCOUNTER — Other Ambulatory Visit: Payer: Self-pay

## 2021-03-15 ENCOUNTER — Ambulatory Visit (INDEPENDENT_AMBULATORY_CARE_PROVIDER_SITE_OTHER): Payer: Medicare Other | Admitting: Family Medicine

## 2021-03-15 DIAGNOSIS — M62830 Muscle spasm of back: Secondary | ICD-10-CM | POA: Diagnosis not present

## 2021-03-15 DIAGNOSIS — G5601 Carpal tunnel syndrome, right upper limb: Secondary | ICD-10-CM | POA: Diagnosis not present

## 2021-03-15 DIAGNOSIS — I10 Essential (primary) hypertension: Secondary | ICD-10-CM | POA: Diagnosis not present

## 2021-03-15 DIAGNOSIS — M48061 Spinal stenosis, lumbar region without neurogenic claudication: Secondary | ICD-10-CM | POA: Insufficient documentation

## 2021-03-15 DIAGNOSIS — M48062 Spinal stenosis, lumbar region with neurogenic claudication: Secondary | ICD-10-CM | POA: Diagnosis not present

## 2021-03-15 MED ORDER — TIZANIDINE HCL 4 MG PO TABS: 4.0000 mg | ORAL_TABLET | Freq: Four times a day (QID) | ORAL | 0 refills | Status: DC | PRN

## 2021-03-15 MED ORDER — PREDNISONE 10 MG (48) PO TBPK: ORAL_TABLET | ORAL | 0 refills | Status: DC

## 2021-03-15 NOTE — Progress Notes (Signed)
Erik Rose - 65 y.o. male MRN [id number]  Date of birth: 03/29/1956  Subjective Chief Complaint  Patient presents with   Hip Pain    HPI Erik Rose is a 65 y.o. male here today with complaint of posterior hip/low back pain.  Pain started suddenly a few days ago.  He is having difficulty walking due to this.  It improves when he sits but seems to "catch" when he stands up.  He denies radiation of pain, numbness, tingling, weakness.  Denies urinary symptoms.   ROS:  A comprehensive ROS was completed and negative except as noted per HPI  No Known Allergies  Past Medical History:  Diagnosis Date   Diabetes mellitus without complication (HCC)    Diverticulitis    GERD (gastroesophageal reflux disease)    Heart disease    Hypertension    Stroke (HCC)    TIA (transient ischemic attack)     Past Surgical History:  Procedure Laterality Date   CARDIAC CATHETERIZATION     CORONARY ANGIOPLASTY WITH STENT PLACEMENT  2010    Social History   Socioeconomic History   Marital status: Married    Spouse name: [name]   Number of children: 4   Years of education: 14   Highest education level: Associate degree: academic program  Occupational History    Comment: Disability/retired.  Tobacco Use   Smoking status: Some Days    Packs/day: 0.50    Pack years: 0.00    Types: Cigarettes   Smokeless tobacco: Never  Vaping Use   Vaping Use: Never used  Substance and Sexual Activity   Alcohol use: Not Currently    Comment: quit 30 yrs ago   Drug use: Not Currently    Types: Marijuana, Cocaine    Comment: stopped 1991   Sexual activity: Not on file  Other Topics Concern   Not on file  Social History Narrative   Live alone. 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 Likes to [employer] range once a month.    Social Determinants of Health   Financial Resource Strain: Low Risk    Difficulty of Paying Living Expenses: Not hard at all  Food Insecurity: No Food Insecurity   Worried About  Programme researcher, broadcasting/film/video in the Last Year: Never true   Ran Out of Food in the Last Year: Never true  Transportation Needs: No Transportation Needs   Lack of Transportation (Medical): No   Lack of Transportation (Non-Medical): No  Physical Activity: Sufficiently Active   Days of Exercise per Week: 7 days   Minutes of Exercise per Session: 120 min  Stress: No Stress Concern Present   Feeling of Stress : Not at all  Social Connections: Moderately Isolated   Frequency of Communication with Friends and Family: More than three times a week   Frequency of Social Gatherings with Friends and Family: More than three times a week   Attends Religious Services: Never   Database administrator or Organizations: No   Attends Banker Meetings: Never   Marital Status: Married    Family History  Problem Relation Age of Onset   Hypertension Mother    Kidney disease Mother    Miscarriages / [place] Mother    Stroke Mother    Heart disease Father    Depression Sister    Kidney disease Sister    Diabetes Brother    Hypertension Brother    Hyperlipidemia Brother    Kidney disease Brother  Health Maintenance  Topic Date Due   Pneumococcal Vaccine 37-40 Years old (1 - PCV) Never done   Hepatitis C Screening  Never done   Zoster Vaccines- Shingrix (2 of 2) 08/05/2017   COVID-19 Vaccine (4 - Booster) 01/22/2021   COLONOSCOPY (Pts 45-22yrs Insurance coverage will need to be confirmed)  12/08/2021 (Originally 01/17/2019)   INFLUENZA VACCINE  05/07/2021   OPHTHALMOLOGY EXAM  06/21/2021   HEMOGLOBIN A1C  08/08/2021   FOOT EXAM  12/06/2021   TETANUS/TDAP  07/19/2030   PNEUMOCOCCAL POLYSACCHARIDE VACCINE AGE 86-64 HIGH RISK  Completed   HIV Screening  Completed   HPV VACCINES  Aged Out      ----------------------------------------------------------------------------------------------------------------------------------------------------------------------------------------------------------------- Physical Exam BP 120/79 (BP Location: Left Arm, Patient Position: Sitting, Cuff Size: Large)   Pulse 83   Ht 5\' 10"  (1.778 m)   Wt 213 lb (96.6 kg)   SpO2 100%   BMI 30.56 kg/m   Physical Exam Constitutional:      Appearance: Normal appearance.  HENT:     Head: Normocephalic and atraumatic.  Musculoskeletal:     Cervical back: Neck supple.     Comments: TTP along paraspinals of R lower lumbar area  Neurological:     Mental Status: He is alert.    ------------------------------------------------------------------------------------------------------------------------------------------------------------------------------------------------------------------- Assessment and Plan  Lumbar paraspinal muscle spasm Given handout for home exercises Start prednisone taper and tizanidine as needed.  Red flags reviewed with him.  If not improving with this will add on PT.    Meds ordered this encounter  Medications   predniSONE (STERAPRED UNI-PAK 48 TAB) 10 MG (48) TBPK tablet    Sig: Taper as directed on packaging    Dispense:  48 tablet    Refill:  0   tiZANidine (ZANAFLEX) 4 MG tablet    Sig: Take 1 tablet (4 mg total) by mouth every 6 (six) hours as needed for muscle spasms.    Dispense:  30 tablet    Refill:  0    No follow-ups on file.    This visit occurred during the SARS-CoV-2 public health emergency.  Safety protocols were in place, including screening questions prior to the visit, additional usage of staff PPE, and extensive cleaning of exam room while observing appropriate contact time as indicated for disinfecting solutions.

## 2021-03-15 NOTE — Assessment & Plan Note (Signed)
Given handout for home exercises Start prednisone taper and tizanidine as needed.  Red flags reviewed with him.  If not improving with this will add on PT.

## 2021-04-21 ENCOUNTER — Other Ambulatory Visit: Payer: Self-pay | Admitting: Family Medicine

## 2021-05-08 ENCOUNTER — Ambulatory Visit: Payer: Medicare Other | Admitting: Family Medicine

## 2021-05-09 ENCOUNTER — Encounter: Payer: Self-pay | Admitting: Family Medicine

## 2021-05-09 ENCOUNTER — Ambulatory Visit (INDEPENDENT_AMBULATORY_CARE_PROVIDER_SITE_OTHER): Payer: Medicare Other | Admitting: Family Medicine

## 2021-05-09 VITALS — BP 127/75 | HR 81 | Ht 70.0 in | Wt 220.0 lb

## 2021-05-09 DIAGNOSIS — E1165 Type 2 diabetes mellitus with hyperglycemia: Secondary | ICD-10-CM

## 2021-05-09 DIAGNOSIS — E782 Mixed hyperlipidemia: Secondary | ICD-10-CM

## 2021-05-09 DIAGNOSIS — I10 Essential (primary) hypertension: Secondary | ICD-10-CM

## 2021-05-09 DIAGNOSIS — Z23 Encounter for immunization: Secondary | ICD-10-CM

## 2021-05-09 DIAGNOSIS — Z794 Long term (current) use of insulin: Secondary | ICD-10-CM | POA: Diagnosis not present

## 2021-05-09 LAB — POCT GLYCOSYLATED HEMOGLOBIN (HGB A1C): HbA1c, POC (controlled diabetic range): 11.2 % — AB (ref 0.0–7.0)

## 2021-05-09 MED ORDER — METFORMIN HCL ER 500 MG PO TB24: 1000.0000 mg | ORAL_TABLET | Freq: Every day | ORAL | 1 refills | Status: DC

## 2021-05-09 MED ORDER — LANTUS SOLOSTAR 100 UNIT/ML ~~LOC~~ SOPN: PEN_INJECTOR | SUBCUTANEOUS | 3 refills | Status: DC

## 2021-05-09 NOTE — Patient Instructions (Addendum)
Great to see you today! Increase lantus to 35 units twice per day Add metformin back on, 1000mg  daily.  Work on dietary changes.  See me again in 3 months.

## 2021-05-09 NOTE — Assessment & Plan Note (Signed)
He continues to do well with atorvastatin.  Recommend continuation.

## 2021-05-09 NOTE — Progress Notes (Signed)
Erik Rose - 65 y.o. male MRN [id number]  Date of birth: 1955-11-08  Subjective Chief Complaint  Patient presents with   Depression    HPI Erik Rose is a 65 year old male here today for follow-up of hypertension, diabetes and hyperlipidemia.  He does have history of CVA as well.    Blood pressure is doing quite well.  He is taking lisinopril and amlodipine as directed.  He is doing better about watching his sodium intake.  He denies any symptoms related to hypertension including chest pain, shortness of breath, palpitations, headache or vision changes.  He feels like his blood sugars have been doing well.  He is taking Lantus 30 units twice per day with Humalog 15 units with meals.  He did use Ozempic previously however had pancreatitis which was presumed to be from this.  He denies any symptoms of hypoglycemia.  ROS:  A comprehensive ROS was completed and negative except as noted per HPI    Past Surgical History:  Procedure Laterality Date   CARDIAC CATHETERIZATION     CORONARY ANGIOPLASTY WITH STENT PLACEMENT  2010    Social History   Socioeconomic History   Marital status: Married    Spouse name: [name]   Number of children: 4   Years of education: 14   Highest education level: Associate degree: academic program  Occupational History    Comment: Disability/retired.  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    Social Determinants of Health   Financial Resource Strain: Low Risk    Difficulty of Paying Living Expenses:  Not hard at all  Food Insecurity: No Food Insecurity   Worried About Programme researcher, broadcasting/film/video in the Last Year: Never true   Ran Out of Food in the Last Year: Never true  Transportation Needs: No Transportation Needs   Lack of Transportation (Medical): No   Lack of Transportation (Non-Medical): No  Physical Activity: Sufficiently Active   Days of Exercise per Week: 7 days   Minutes of Exercise per Session: 120 min  Stress: No Stress Concern Present   Feeling of Stress : Not at all  Social Connections: Moderately Isolated   Frequency of Communication with Friends and Family: More than three times a week   Frequency of Social Gatherings with Friends and Family: More than three times a week   Attends Religious Services: Never   Database administrator or Organizations: No   Attends Banker Meetings: Never   Marital Status: Married    Family History  Problem Relation Age of Onset   Hypertension Mother    Kidney disease Mother    Miscarriages / [place] Mother    Stroke Mother    Heart disease Father    Depression Sister    Kidney disease Sister    Diabetes Brother    Hypertension Brother    Hyperlipidemia Brother    Kidney disease Brother  Health Maintenance  Topic Date Due   Hepatitis C Screening  Never done   Pneumococcal Vaccine 61-27 Years old (2 - PCV) 12/09/2019   COVID-19 Vaccine (4 - Booster) 01/22/2021   INFLUENZA VACCINE  05/07/2021   COLONOSCOPY (Pts 45-47yrs Insurance coverage will need to be confirmed)  12/08/2021 (Originally 01/17/2019)   OPHTHALMOLOGY EXAM  06/21/2021   HEMOGLOBIN A1C  11/09/2021   FOOT EXAM  12/06/2021   TETANUS/TDAP  07/19/2030   PNEUMOCOCCAL POLYSACCHARIDE VACCINE AGE 67-64 HIGH RISK  Completed   HIV Screening  Completed   Zoster Vaccines- Shingrix  Completed   HPV VACCINES  Aged Out      ----------------------------------------------------------------------------------------------------------------------------------------------------------------------------------------------------------------- Physical Exam BP 127/75 (BP Location: Left Arm, Patient Position: Sitting, Cuff Size: Large)   Pulse 81   Ht 5\' 10"  (1.778 m)   Wt 220 lb (99.8 kg)   SpO2 100%   BMI 31.57 kg/m   Physical Exam Constitutional:      Appearance: Normal appearance.  HENT:     Head: Normocephalic and atraumatic.  Eyes:     General: No scleral icterus. Cardiovascular:     Rate and Rhythm: Normal rate and regular rhythm.  Pulmonary:     Effort: Pulmonary effort is normal.     Breath sounds: Normal breath sounds.  Musculoskeletal:     Cervical back: Neck supple.  Neurological:     General: No focal deficit present.     Mental Status: He is alert.  Psychiatric:        Mood and Affect: Mood normal.        Behavior: Behavior normal.    ------------------------------------------------------------------------------------------------------------------------------------------------------------------------------------------------------------------- Assessment and Plan  Essential hypertension Blood pressure is well controlled at this time.  Continue current medications for management of hypertension.  Type 2 diabetes mellitus with hyperglycemia, with long-term current use of insulin (HCC) Worsening control of blood glucose.  Recommend working on lower carbohydrate diet.  Increase Lantus to 35 units twice daily.  Continue Humalog 15 units 3 times daily.  Adding metformin XR back on at 1000 mg daily.  Mixed hyperlipidemia He continues to do well with atorvastatin.  Recommend continuation.   Meds ordered this encounter  Medications   insulin glargine (LANTUS SOLOSTAR) 100 UNIT/ML Solostar Pen    Sig: Inject 35 units BID    Refill:  3   metFORMIN (GLUCOPHAGE XR) 500 MG 24 hr tablet    Sig:  Take 2 tablets (1,000 mg total) by mouth daily with breakfast.    Dispense:  180 tablet    Refill:  1    Return in about 3 months (around 08/09/2021) for HTN/DM.    This visit occurred during the SARS-CoV-2 public health emergency.  Safety protocols were in place, including screening questions prior to the visit, additional usage of staff PPE, and extensive cleaning of exam room while observing appropriate contact time as indicated for disinfecting solutions.

## 2021-05-09 NOTE — Assessment & Plan Note (Signed)
Worsening control of blood glucose.  Recommend working on lower carbohydrate diet.  Increase Lantus to 35 units twice daily.  Continue Humalog 15 units 3 times daily.  Adding metformin XR back on at 1000 mg daily.

## 2021-05-09 NOTE — Assessment & Plan Note (Signed)
Blood pressure is well controlled at this time.  Continue current medications for management of hypertension. °

## 2021-05-11 ENCOUNTER — Other Ambulatory Visit: Payer: Self-pay | Admitting: Family Medicine

## 2021-05-18 ENCOUNTER — Telehealth: Payer: Self-pay | Admitting: Family Medicine

## 2021-05-18 NOTE — Chronic Care Management (AMB) (Signed)
  Care Management  Note   05/18/2021 Name: Erik Rose MRN: [id number] DOB: Mar 29, 1956  [name] is a 65 y.o. year old male who is a primary care patient of [name], DO. The care management team was consulted for assistance with chronic disease management and care coordination needs.   Mr. Gervasi was given information about Care Management services today including:  CCM service includes personalized support from designated clinical staff supervised by the physician, including individualized plan of care and coordination with other care providers 24/7 contact phone numbers for assistance for urgent and routine care needs. Service will only be billed when office clinical staff spend 20 minutes or more in a month to coordinate care. Only one practitioner may furnish and bill the service in a calendar month. The patient may stop CCM services at amy time (effective at the end of the month) by phone call to the office staff. The patient will be responsible for cost sharing (co-pay) or up to 20% of the service fee (after annual deductible is met)  Patient agreed to services and verbal consent obtained.  Follow up plan:   An initial telephone outreach has been scheduled for: 06/26/21 $[email or web address]$   [place]

## 2021-05-18 NOTE — Progress Notes (Signed)
  Care Management   Follow Up Note   05/18/2021 Name: Erik Rose MRN: [id number] DOB: Jun 26, 1956   Referred by: Everrett Coombe, DO Reason for referral : No chief complaint on file.   An unsuccessful telephone outreach was attempted today. The patient was referred to the case management team for assistance with care management and care coordination.   Follow Up Plan: The care management team will reach out to the patient again over the next 7 days.   SIGNATURE  [place]

## 2021-05-29 ENCOUNTER — Ambulatory Visit: Payer: Medicare Other | Admitting: Family Medicine

## 2021-05-29 ENCOUNTER — Other Ambulatory Visit: Payer: Self-pay | Admitting: Family Medicine

## 2021-05-29 DIAGNOSIS — Z794 Long term (current) use of insulin: Secondary | ICD-10-CM

## 2021-05-29 DIAGNOSIS — E1165 Type 2 diabetes mellitus with hyperglycemia: Secondary | ICD-10-CM

## 2021-05-30 ENCOUNTER — Other Ambulatory Visit: Payer: Self-pay

## 2021-05-30 ENCOUNTER — Ambulatory Visit (INDEPENDENT_AMBULATORY_CARE_PROVIDER_SITE_OTHER): Payer: Medicare Other | Admitting: Sports Medicine

## 2021-05-30 DIAGNOSIS — M48061 Spinal stenosis, lumbar region without neurogenic claudication: Secondary | ICD-10-CM

## 2021-05-30 MED ORDER — GABAPENTIN 300 MG PO CAPS: ORAL_CAPSULE | ORAL | 3 refills | Status: DC

## 2021-05-30 MED ORDER — PREDNISONE 50 MG PO TABS: ORAL_TABLET | ORAL | 0 refills | Status: DC

## 2021-05-30 NOTE — Assessment & Plan Note (Addendum)
Erik Rose is a very pleasant 65 year old male, he has chronic low back pain, he does have an MRI confirming severe lumbar spinal stenosis from L3-L5. He also has seen a neurosurgeon in the past. He does have back pain, radiating down his left leg in an L4 distribution. No progressive weakness, no bowel or bladder dysfunction. Currently taking 1 gabapentin pill daily, we discussed the natural history of spinal stenosis and the treatment options, we will continue conservative treatment, adding 5 days of prednisone, aggressive formal physical therapy, increasing gabapentin to 3 times daily with the option to go up to a total of 3600 mg daily over the next several months. If failure of the above over the next 6 to 8 weeks we will proceed with lumbar epidurals. Decompression with Dr. Conchita Paris would be the absolute last resort, I do not see any obvious listhesis so my hope is that if he did need operative intervention it would simply be a laminectomy. Return to see me in 4 to 6 weeks.

## 2021-05-30 NOTE — Progress Notes (Signed)
    Procedures performed today:    None.  Independent interpretation of notes and tests performed by another provider:   None.  Brief History, Exam, Impression, and Recommendations:    Lumbar spinal stenosis Erik Rose is a very pleasant 65 year old male, he has chronic low back pain, he does have an MRI confirming severe lumbar spinal stenosis from L3-L5. He also has seen a neurosurgeon in the past. He does have back pain, radiating down his left leg in an L4 distribution. No progressive weakness, no bowel or bladder dysfunction. Currently taking 1 gabapentin pill daily, we discussed the natural history of spinal stenosis and the treatment options, we will continue conservative treatment, adding 5 days of prednisone, aggressive formal physical therapy, increasing gabapentin to 3 times daily with the option to go up to a total of 3600 mg daily over the next several months. If failure of the above over the next 6 to 8 weeks we will proceed with lumbar epidurals. Decompression with Dr. Conchita Paris would be the absolute last resort, I do not see any obvious listhesis so my hope is that if he did need operative intervention it would simply be a laminectomy. Return to see me in 4 to 6 weeks.    ___________________________________________ Erik Rose. Erik Rose, M.D., ABFM., CAQSM. Primary Care and Sports Medicine [place] [facility]  Adjunct Instructor of Family Medicine  [employer]

## 2021-06-01 ENCOUNTER — Ambulatory Visit (INDEPENDENT_AMBULATORY_CARE_PROVIDER_SITE_OTHER): Payer: Medicare Other | Admitting: Rehabilitative and Restorative Service Providers"

## 2021-06-01 ENCOUNTER — Other Ambulatory Visit: Payer: Self-pay

## 2021-06-01 DIAGNOSIS — M25551 Pain in right hip: Secondary | ICD-10-CM

## 2021-06-01 DIAGNOSIS — M5440 Lumbago with sciatica, unspecified side: Secondary | ICD-10-CM

## 2021-06-01 DIAGNOSIS — M6281 Muscle weakness (generalized): Secondary | ICD-10-CM

## 2021-06-01 DIAGNOSIS — G8929 Other chronic pain: Secondary | ICD-10-CM | POA: Diagnosis not present

## 2021-06-01 NOTE — Therapy (Signed)
[facility] [place] [address] [place], [place], [address] Phone: [phone]   Fax:  [phone]  Physical Therapy Evaluation  Patient Details  Name: Erik Rose MRN: [id number] Date of Birth: [birth year] Referring Provider (PT): Rodney Langton, MD   Encounter Date: 06/01/2021   PT End of Session - 06/01/21 1459     Visit Number 1    Number of Visits 12    Date for PT Re-Evaluation 07/13/21    Authorization Type UHC medicare    PT Start Time 1450    PT Stop Time 1530    PT Time Calculation (min) 40 min    Activity Tolerance Patient tolerated treatment well    Behavior During Therapy [facility] for tasks assessed/performed             Past Medical History:  Diagnosis Date   Diabetes mellitus without complication (HCC)    Diverticulitis    GERD (gastroesophageal reflux disease)    Heart disease    Hypertension    Stroke (HCC)    TIA (transient ischemic attack)     Past Surgical History:  Procedure Laterality Date   CARDIAC CATHETERIZATION     CORONARY ANGIOPLASTY WITH STENT PLACEMENT  2010    There were no vitals filed for this visit.    Subjective Assessment - 06/01/21 1453     Subjective The patient has pain in R gluts that travels into the thigh, and occasionally pain into the R calf muscle. These symptoms are intermittent in nature and come and go as episodes.  He had a bad experience 4 months ago where he couldn't lift his R leg to get dressed.  The pain returned last Friday, but not as bad as it was 4 months ago.    Pertinent History HTN, diabetes, CAD    How long can you stand comfortably? varies- at times 5 minutes    Patient Stated Goals "be able to get up in the morning without this pain"; get back on a stationery bike    Currently in Pain? Yes    Pain Score 7     Pain Location Buttocks    Pain Orientation Right    Pain Type Chronic pain;Acute pain    Pain Onset More than a month ago     Pain Frequency Intermittent    Aggravating Factors  comes and goes    Pain Relieving Factors heating pad                [facility] PT Assessment - 06/01/21 1455       Assessment   Medical Diagnosis spinal stenosis of the lumbar region    Referring Provider (PT) Rodney Langton, MD    Onset Date/Surgical Date 05/30/21    Hand Dominance Left    Prior Therapy none for this issue      Precautions   Precautions None      Restrictions   Weight Bearing Restrictions No      Balance Screen   Has the patient fallen in the past 6 months No    Has the patient had a decrease in activity level because of a fear of falling?  No    Is the patient reluctant to leave their home because of a fear of falling?  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     Observation/Other [employer] on Therapeutic Outcomes (FOTO)  65%      Sensation   Light Touch Appears Intact    Additional Comments gets some tingling from gluts down posterior aspect of thighs, "never in my feet"      ROM / Strength   AROM / PROM / Strength AROM;Strength      AROM   Overall AROM  Deficits    AROM Assessment Site Lumbar    Lumbar Flexion 75% limitation    Lumbar Extension 75% limitation    Lumbar - Right Side Bend 50% limitation    Lumbar - Left Side Bend 50% limitation    Lumbar - Right Rotation 25% limitation    Lumbar - Left Rotation 25% limitation      Strength   Overall Strength Within functional limits for tasks performed    Strength Assessment Site Hip;Knee;Ankle    Right/Left Hip Right;Left    Right Hip Flexion 5/5    Left Hip Flexion 5/5    Right/Left Knee Right;Left    Right Knee Flexion 5/5    Right Knee Extension 5/5    Left Knee Extension 5/5    Right/Left Ankle Right;Left    Right Ankle Dorsiflexion  5/5    Right Ankle Plantar Flexion 4/5    Left Ankle Dorsiflexion 5/5    Left Ankle Plantar Flexion 4/5      Flexibility   Soft Tissue Assessment /Muscle Length yes    Hamstrings bilateral hamstring tightness to -20 degrees (90/90 supine hip/knee position)    Quadriceps tightness bilat      Palpation   Spinal mobility hypomobility t/o lumbar spine CPA    Palpation comment tender along R glut med, R sacral border and bilat lumbar multifidi                        Objective measurements completed on examination: See above findings.       [facility] Adult PT Treatment/Exercise - 06/01/21 1614       Exercises   Exercises Knee/Hip;Lumbar      Lumbar Exercises: Stretches   Single Knee to Chest Stretch Right;Left;2 reps;30 seconds      Lumbar Exercises: Standing   Functional Squats 5 reps    Functional Squats Limitations attempted chair squat, however painful with increased trunk flexion noted.  Moved to quarter wall slide x 10 reps for home.      Knee/Hip Exercises: Stretches   Active Hamstring Stretch Right;Left;2 reps;30 seconds    Piriformis Stretch Right;Left;1 rep;30 seconds                    PT Education - 06/01/21 1533     Education Details HEP    Person(s) Educated Patient    Methods Explanation;Demonstration;Handout    Comprehension Verbalized understanding;Returned demonstration                 PT Long Term Goals - 06/01/21 1551       PT LONG TERM GOAL #1   Title The patient will be indep with HEP for LE strength, flexibility and walking.    Time 6    Period Weeks    Target Date 07/13/21      PT LONG TERM GOAL #2   Title The patient will improve functional status score from 65% up to 72%.    Time 6  Period Weeks    Target Date 07/13/21      PT LONG TERM GOAL #3   Title The patient will report ambulating for 1 mile without experience "weakness" in legs.    Baseline Patient c/o sensation that he has to sit down due to  weakness with walking.    Time 6    Period Weeks    Target Date 07/13/21      PT LONG TERM GOAL #4   Title The patient will report pain in R buttocks < or equal to 2/10.    Time 6    Period Weeks    Target Date 07/13/21                    Plan - 06/01/21 1606     Clinical Impression Statement The patient is a 65 yo male with h/o episodic back pain and dx of spinal stenosis.  He has had increasing R glut pain that radiates into R posterior thigh, as well as pain that radiates from gluts into posterior legs with extended walking that leads to a "weak" sensation.  He presents with impairments in lumbar AROM, spinal mobility, muscle flexibility, pain with palpation R glut med, and pain that limits ambulation.  PT to address deficits to returnto prior functional status.    Personal Factors and Comorbidities Comorbidity 3+;Time since onset of injury/illness/exacerbation    Comorbidities HTN, diabetes, CAD, spinal stenosis    Examination-Activity Limitations Locomotion Level;Squat;Stand    Examination-Participation Restrictions Community Activity    Stability/Clinical Decision Making Stable/Uncomplicated    Clinical Decision Making Low    Rehab Potential Good    PT Frequency 2x / week    PT Duration 6 weeks    PT Treatment/Interventions ADLs/Self Care Home Management;Patient/family education;Taping;Therapeutic activities;Therapeutic exercise;Manual techniques;Electrical Stimulation;Moist Heat;Aquatic Therapy;Traction;Dry needling    PT Next Visit Plan check on initial HEP, progress core strengthening, flexibility, STM/DN or traction if indicated.    PT Home Exercise Plan [email or web address]    Consulted and Agree with Plan of Care Patient             Patient will benefit from skilled therapeutic intervention in order to improve the following deficits and impairments:  Pain, Hypomobility, Impaired flexibility, Postural dysfunction, Decreased range of motion, Increased fascial restricitons,  Decreased activity tolerance  Visit Diagnosis: Chronic bilateral low back pain with sciatica, sciatica laterality unspecified  Pain in right hip  Muscle weakness (generalized)     Problem List Patient Active Problem List   Diagnosis Date Noted   Lumbar spinal stenosis 03/15/2021   Pancreatitis 12/10/2020   Cervical radiculopathy 04/18/2020   Smoking greater than 20 pack years 04/18/2020   Eczema 01/18/2020   Colon cancer screening 12/09/2018   Essential hypertension 09/09/2018   Type 2 diabetes mellitus with hyperglycemia, with long-term current use of insulin (HCC) 09/09/2018   CAD (coronary artery disease), native coronary artery 09/09/2018   History of CVA (cerebrovascular accident) 09/09/2018   Mixed hyperlipidemia 02/05/2018   Margretta Ditty PT, MPT  Ivorie Uplinger 06/01/2021, 4:15 PM  [facility] [place] [address] [place], [place], [address] Phone: [phone]   Fax:  [phone]  Name: Erik Rose MRN: [id number] Date of Birth: 07/19/1956

## 2021-06-01 NOTE — Patient Instructions (Signed)
Access Code: [facility] URL: [email or web address] Date: 06/01/2021 Prepared by: Margretta Ditty  Exercises Hooklying Single Knee to Chest Stretch - 2 x daily - 7 x weekly - 1 sets - 2-3 reps - 30 seconds hold Supine Piriformis Stretch with Foot on Ground - 2 x daily - 7 x weekly - 1 sets - 2-3 reps - 30 seconds hold Supine Hamstring Stretch with Strap - 2 x daily - 7 x weekly - 1 sets - 2-3 reps - 30 seconds hold Wall Quarter Squat - 2 x daily - 7 x weekly - 1 sets - 10 reps

## 2021-06-06 ENCOUNTER — Ambulatory Visit: Payer: Medicare Other | Admitting: Physical Therapy

## 2021-06-06 ENCOUNTER — Other Ambulatory Visit: Payer: Self-pay

## 2021-06-06 DIAGNOSIS — M6281 Muscle weakness (generalized): Secondary | ICD-10-CM | POA: Diagnosis not present

## 2021-06-06 DIAGNOSIS — G8929 Other chronic pain: Secondary | ICD-10-CM | POA: Diagnosis not present

## 2021-06-06 DIAGNOSIS — M25551 Pain in right hip: Secondary | ICD-10-CM

## 2021-06-06 DIAGNOSIS — M5441 Lumbago with sciatica, right side: Secondary | ICD-10-CM

## 2021-06-06 DIAGNOSIS — M5440 Lumbago with sciatica, unspecified side: Secondary | ICD-10-CM | POA: Diagnosis not present

## 2021-06-06 NOTE — Therapy (Signed)
[facility] [place] [address] [place], [place], [address] Phone: [phone]   Fax:  [phone]  Physical Therapy Treatment  Patient Details  Name: Erik Rose MRN: [id number] Date of Birth: [birth year] Referring Provider (PT): Rodney Langton, MD   Encounter Date: 06/06/2021   PT End of Session - 06/06/21 1644     Visit Number 2    Number of Visits 12    Date for PT Re-Evaluation 07/13/21    Authorization Type UHC medicare    Authorization - Visit Number 2    Progress Note Due on Visit 10    PT Start Time 1600    PT Stop Time 1645    PT Time Calculation (min) 45 min    Activity Tolerance Patient tolerated treatment well    Behavior During Therapy [facility] for tasks assessed/performed             Past Medical History:  Diagnosis Date   Diabetes mellitus without complication (HCC)    Diverticulitis    GERD (gastroesophageal reflux disease)    Heart disease    Hypertension    Stroke (HCC)    TIA (transient ischemic attack)     Past Surgical History:  Procedure Laterality Date   CARDIAC CATHETERIZATION     CORONARY ANGIOPLASTY WITH STENT PLACEMENT  2010    There were no vitals filed for this visit.   Subjective Assessment - 06/06/21 1602     Subjective Pt states "the pain is always there, just the severity changes'. States he tried "a few" of the exercises    Patient Stated Goals "be able to get up in the morning without this pain"; get back on a stationery bike    Currently in Pain? Yes    Pain Score 5     Pain Location Buttocks    Pain Orientation Right    Pain Descriptors / Indicators Aching                [facility] PT Assessment - 06/06/21 0001       Assessment   Medical Diagnosis spinal stenosis of the lumbar region    Referring Provider (PT) Rodney Langton, MD    Onset Date/Surgical Date 05/30/21    Hand Dominance Left    Prior Therapy none for this issue      Palpation   SI  assessment  SIJ hypomoble with CPAs and UPAs                           [facility] Adult PT Treatment/Exercise - 06/06/21 0001       Lumbar Exercises: Stretches   Passive Hamstring Stretch Right;Left;2 reps;30 seconds    Passive Hamstring Stretch Limitations supine with strap    Single Knee to Chest Stretch Right;Left;2 reps;30 seconds    Piriformis Stretch Right;Left;2 reps;20 seconds    Other Lumbar Stretch Exercise doorway QL stretch x 30 sec      Lumbar Exercises: Aerobic   Nustep level 5 x 5 mins for warm up      Lumbar Exercises: Standing   Other Standing Lumbar Exercises sidestep with red TB    Other Standing Lumbar Exercises wall slide x 10      Lumbar Exercises: Supine   Bridge with [employer] 10 reps    Bridge with clamshell 10 reps    Bridge with [employer] Limitations cues for technique and neutral pelvis      Manual Therapy  Manual Therapy Joint mobilization;Soft tissue mobilization    Joint Mobilization CPAs and UPAs grade 2-3 SIJ    Soft tissue mobilization lumbar paraspinals, Rt QL and glutes                    PT Education - 06/06/21 1640     Education Details dry needling    Person(s) Educated Patient    Methods Explanation    Comprehension Verbalized understanding                 PT Long Term Goals - 06/01/21 1551       PT LONG TERM GOAL #1   Title The patient will be indep with HEP for LE strength, flexibility and walking.    Time 6    Period Weeks    Target Date 07/13/21      PT LONG TERM GOAL #2   Title The patient will improve functional status score from 65% up to 72%.    Time 6    Period Weeks    Target Date 07/13/21      PT LONG TERM GOAL #3   Title The patient will report ambulating for 1 mile without experience "weakness" in legs.    Baseline Patient c/o sensation that he has to sit down due to weakness with walking.    Time 6    Period Weeks    Target Date 07/13/21      PT LONG TERM GOAL #4    Title The patient will report pain in R buttocks < or equal to 2/10.    Time 6    Period Weeks    Target Date 07/13/21                   Plan - 06/06/21 1644     Clinical Impression Statement Pt responds well to addition of manual therapy this visit. Discussed dry needling and pt is open to trying it next visit. Good tolerance to progression of exercises    PT Next Visit Plan progress HEP, manual/DN as appropriate    PT Home Exercise Plan [facility]    Consulted and Agree with Plan of Care Patient             Patient will benefit from skilled therapeutic intervention in order to improve the following deficits and impairments:     Visit Diagnosis: Chronic bilateral low back pain with sciatica, sciatica laterality unspecified  Pain in right hip  Muscle weakness (generalized)     Problem List Patient Active Problem List   Diagnosis Date Noted   Lumbar spinal stenosis 03/15/2021   Pancreatitis 12/10/2020   Cervical radiculopathy 04/18/2020   Smoking greater than 20 pack years 04/18/2020   Eczema 01/18/2020   Colon cancer screening 12/09/2018   Essential hypertension 09/09/2018   Type 2 diabetes mellitus with hyperglycemia, with long-term current use of insulin (HCC) 09/09/2018   CAD (coronary artery disease), native coronary artery 09/09/2018   History of CVA (cerebrovascular accident) 09/09/2018   Mixed hyperlipidemia 02/05/2018   Ajdin Macke, PT  Erva Koke 06/06/2021, 4:47 PM  [facility] [place] [address] [place], [place], [address] Phone: [phone]   Fax:  [phone]  Name: Erik Rose MRN: [id number] Date of Birth: [birth year]

## 2021-06-06 NOTE — Patient Instructions (Signed)
Access Code: [email or web address] URL: [email or web address] Date: 06/06/2021 Prepared by: Reggy Eye  Patient Education Trigger Point Dry Needling

## 2021-06-08 ENCOUNTER — Encounter: Payer: Medicare Other | Admitting: Physical Therapy

## 2021-06-13 ENCOUNTER — Other Ambulatory Visit: Payer: Self-pay

## 2021-06-13 ENCOUNTER — Ambulatory Visit: Payer: Medicare Other | Admitting: Physical Therapy

## 2021-06-13 DIAGNOSIS — M5440 Lumbago with sciatica, unspecified side: Secondary | ICD-10-CM

## 2021-06-13 DIAGNOSIS — G8929 Other chronic pain: Secondary | ICD-10-CM

## 2021-06-13 DIAGNOSIS — M6281 Muscle weakness (generalized): Secondary | ICD-10-CM | POA: Diagnosis not present

## 2021-06-13 DIAGNOSIS — M25551 Pain in right hip: Secondary | ICD-10-CM | POA: Diagnosis not present

## 2021-06-13 NOTE — Therapy (Signed)
[facility] [place] [address] [place], [place], [address] Phone: [phone]   Fax:  [phone]  Physical Therapy Treatment  Patient Details  Name: Erik Rose MRN: [id number] Date of Birth: [birth year] Referring Provider (PT): Rodney Langton, MD   Encounter Date: 06/13/2021   PT End of Session - 06/13/21 1446     Visit Number 3    Number of Visits 12    Date for PT Re-Evaluation 07/13/21    Authorization Type UHC medicare    Authorization - Visit Number 3    Progress Note Due on Visit 10    PT Start Time 1350    PT Stop Time 1430    PT Time Calculation (min) 40 min    Activity Tolerance Patient tolerated treatment well    Behavior During Therapy [facility] for tasks assessed/performed             Past Medical History:  Diagnosis Date   Diabetes mellitus without complication (HCC)    Diverticulitis    GERD (gastroesophageal reflux disease)    Heart disease    Hypertension    Stroke (HCC)    TIA (transient ischemic attack)     Past Surgical History:  Procedure Laterality Date   CARDIAC CATHETERIZATION     CORONARY ANGIOPLASTY WITH STENT PLACEMENT  2010    There were no vitals filed for this visit.   Subjective Assessment - 06/13/21 1353     Subjective Pt reports the pain is "negligable". Pain is no longer as intense (6/10) and it's not all the time. 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    How long can you stand comfortably? 10-15 min    Patient Stated Goals "be able to get up in the morning without this pain"    Currently in Pain? Yes    Pain Score 3     Pain Location Sacrum    Pain Orientation Right    Pain Descriptors / Indicators Aching    Pain Radiating Towards into glute med area    Aggravating Factors  standing and bending over    Pain Relieving Factors sitting, heating pad                [facility] PT Assessment - 06/13/21 0001        Assessment   Medical Diagnosis spinal stenosis of the lumbar region    Referring Provider (PT) Rodney Langton, MD    Onset Date/Surgical Date 05/30/21    Hand Dominance Left    Prior Therapy none for this issue              [facility] Adult PT Treatment/Exercise - 06/13/21 0001       Self-Care   Self-Care Other Self-Care Comments    Other Self-Care Comments  Pt instructed in self massage with ball against wall to hip/ lumbar musculature; pt returned demo with cues.      Lumbar Exercises: Stretches   Passive Hamstring Stretch Right;Left;2 reps;20 seconds   seated with hip hinge.   Hip Flexor Stretch Right;Left;2 reps;20 seconds   seated   Piriformis Stretch Right;Left;2 reps;20 seconds          Lumbar Exercises: Aerobic   Nustep level 5 x 5 mins for warm up      Lumbar Exercises: Seated   Sit to Stand 5 reps   with eccentric lowering to chair.     Knee/Hip Exercises: Sidelying  Clams Rt clam x 10, reverse x 5 (very limited range available)      Manual Therapy   Soft tissue mobilization Rt lumbar paraspinals and Rt hip glute/ deep hip rotators with passsive/active hip ER/IR.              PT Long Term Goals - 06/13/21 1446       PT LONG TERM GOAL #1   Title The patient will be indep with HEP for LE strength, flexibility and walking.    Time 6    Period Weeks    Status On-going      PT LONG TERM GOAL #2   Title The patient will improve functional status score from 65% up to 72%.    Time 6    Period Weeks    Status On-going      PT LONG TERM GOAL #3   Title The patient will report ambulating for 1 mile without experience "weakness" in legs.    Baseline Patient c/o sensation that he has to sit down due to weakness with walking.    Time 6    Period Weeks    Status On-going      PT LONG TERM GOAL #4   Title The patient will report pain in R buttocks < or equal to 2/10.    Time 6    Period Weeks    Status On-going                   Plan -  06/13/21 1439     Clinical Impression Statement Positive response to HEP exercises so far. Pt shown seated versions of stretches, with good tolerance reporting reduction of tightness in Rt SI after completing.  Pt demonstrated some difficulty controlling eccentric lowering to chair (last 2") with sit to/from stand.  Limited Rt hip IR and tender in deep hip rotators with manual therapy; may benefit from additional manual therapy/ DN to this area. He is also tight in Rt lumbar paraspinals.  Pt ended session with no pain. Progressing towards goals.    Rehab Potential Good    PT Frequency 2x / week    PT Duration 6 weeks    PT Treatment/Interventions ADLs/Self Care Home Management;Patient/family education;Taping;Therapeutic activities;Therapeutic exercise;Manual techniques;Electrical Stimulation;Moist Heat;Aquatic Therapy;Traction;Dry needling    PT Next Visit Plan progress HEP, manual/DN as appropriate    PT Home Exercise Plan [email or web address]    Consulted and Agree with Plan of Care Patient             Patient will benefit from skilled therapeutic intervention in order to improve the following deficits and impairments:  Pain, Hypomobility, Impaired flexibility, Postural dysfunction, Decreased range of motion, Increased fascial restricitons, Decreased activity tolerance  Visit Diagnosis: Chronic bilateral low back pain with sciatica, sciatica laterality unspecified  Pain in right hip  Muscle weakness (generalized)     Problem List Patient Active Problem List   Diagnosis Date Noted   Lumbar spinal stenosis 03/15/2021   Pancreatitis 12/10/2020   Cervical radiculopathy 04/18/2020   Smoking greater than 20 pack years 04/18/2020   Eczema 01/18/2020   Colon cancer screening 12/09/2018   Essential hypertension 09/09/2018   Type 2 diabetes mellitus with hyperglycemia, with long-term current use of insulin (HCC) 09/09/2018   CAD (coronary artery disease), native coronary artery 09/09/2018    History of CVA (cerebrovascular accident) 09/09/2018   Mixed hyperlipidemia 02/05/2018   Mayer Camel, PTA 06/13/21 2:48 PM   [facility] [address] [place], [place], [address] Phone: [phone]   Fax:  [phone]  Name: Erik Rose MRN: [id number] Date of Birth: 06/20/1956

## 2021-06-15 ENCOUNTER — Ambulatory Visit: Payer: Medicare Other | Admitting: Physical Therapy

## 2021-06-15 ENCOUNTER — Telehealth: Payer: Self-pay | Admitting: Family Medicine

## 2021-06-15 ENCOUNTER — Other Ambulatory Visit: Payer: Self-pay

## 2021-06-15 DIAGNOSIS — M6281 Muscle weakness (generalized): Secondary | ICD-10-CM | POA: Diagnosis not present

## 2021-06-15 DIAGNOSIS — M5440 Lumbago with sciatica, unspecified side: Secondary | ICD-10-CM | POA: Diagnosis not present

## 2021-06-15 DIAGNOSIS — G8929 Other chronic pain: Secondary | ICD-10-CM

## 2021-06-15 DIAGNOSIS — M25551 Pain in right hip: Secondary | ICD-10-CM | POA: Diagnosis not present

## 2021-06-15 NOTE — Telephone Encounter (Signed)
Patient was in office and stated that [employer] faxed a request for a refill on medication listed below and that he was checking to make sure we received the fax for this. I let patient know I would send a phone note to the provider but the provider is out of the office today. And covering provider is also out (Dr A). Routing to the [facility] pool as well. AM *06/15/21*   insulin glargine (LANTUS SOLOSTAR) 100 UNIT/ML Solostar Pen

## 2021-06-15 NOTE — Therapy (Signed)
[facility] [place] [address] [place], [place], [address] Phone: [phone]   Fax:  [phone]  Physical Therapy Treatment  Patient Details  Name: Erik Rose MRN: [id number] Date of Birth: Feb 05, 1956 Referring Provider (PT): Rodney Langton, MD   Encounter Date: 06/15/2021   PT End of Session - 06/15/21 1403     Visit Number 4    Number of Visits 12    Date for PT Re-Evaluation 07/13/21    Authorization Type UHC medicare    Authorization - Visit Number 4    Progress Note Due on Visit 10    PT Start Time 1315    PT Stop Time 1400    PT Time Calculation (min) 45 min    Activity Tolerance Patient tolerated treatment well    Behavior During Therapy [facility] for tasks assessed/performed             Past Medical History:  Diagnosis Date   Diabetes mellitus without complication (HCC)    Diverticulitis    GERD (gastroesophageal reflux disease)    Heart disease    Hypertension    Stroke (HCC)    TIA (transient ischemic attack)     Past Surgical History:  Procedure Laterality Date   CARDIAC CATHETERIZATION     CORONARY ANGIOPLASTY WITH STENT PLACEMENT  2010    There were no vitals filed for this visit.   Subjective Assessment - 06/15/21 1319     Subjective Pt states the pain on the Rt side/hip has decreased, he has pain in the middle of the back which decreases with sitting    Patient Stated Goals "be able to get up in the morning without this pain"    Currently in Pain? Yes    Pain Score 3     Pain Location Back    Pain Orientation Lower    Pain Descriptors / Indicators Aching;Sore                               [facility] Adult PT Treatment/Exercise - 06/15/21 0001       Lumbar Exercises: Stretches   Passive Hamstring Stretch Right;Left;2 reps;20 seconds   seated with hip hinge.   Hip Flexor Stretch Right;Left;2 reps;20 seconds   seated   Quad Stretch Right;Left;3 reps;10 seconds     Quad Stretch Limitations childs pose    Piriformis Stretch Right;Left;2 reps;20 seconds    Other Lumbar Stretch Exercise childs pose with lateral flexion both directions x 20 sec    Other Lumbar Stretch Exercise Rt hip IR supine 5 x 5 sec      Lumbar Exercises: Aerobic   Recumbent Bike level 3 x 5 min for warm up      Lumbar Exercises: Standing   Other Standing Lumbar Exercises wall slide x 10 with 10#KB      Lumbar Exercises: Seated   Sit to Stand 10 reps   with 10# KB, focus on eccentric lowering     Knee/Hip Exercises: Sidelying   Hip ABduction 10 reps;Left;Right    Clams Rt clam x 10, reverse clam x 10      Manual Therapy   Joint Mobilization CPAs and UPAs SIJ grade 2-3    Soft tissue mobilization Rt lumbar paraspinals, glutes                          PT Long Term Goals - 06/13/21  1446       PT LONG TERM GOAL #1   Title The patient will be indep with HEP for LE strength, flexibility and walking.    Time 6    Period Weeks    Status On-going      PT LONG TERM GOAL #2   Title The patient will improve functional status score from 65% up to 72%.    Time 6    Period Weeks    Status On-going      PT LONG TERM GOAL #3   Title The patient will report ambulating for 1 mile without experience "weakness" in legs.    Baseline Patient c/o sensation that he has to sit down due to weakness with walking.    Time 6    Period Weeks    Status On-going      PT LONG TERM GOAL #4   Title The patient will report pain in R buttocks < or equal to 2/10.    Time 6    Period Weeks    Status On-going                   Plan - 06/15/21 1403     Clinical Impression Statement Pt continues to tolerate exercises well. Decreased mobility into Rt hip IR which improves with manual work and stretching.    PT Next Visit Plan progress HEP, manual/DN as appropriate    PT Home Exercise Plan [facility]    Consulted and Agree with Plan of Care Patient              Patient will benefit from skilled therapeutic intervention in order to improve the following deficits and impairments:     Visit Diagnosis: Chronic bilateral low back pain with sciatica, sciatica laterality unspecified  Pain in right hip  Muscle weakness (generalized)     Problem List Patient Active Problem List   Diagnosis Date Noted   Lumbar spinal stenosis 03/15/2021   Pancreatitis 12/10/2020   Cervical radiculopathy 04/18/2020   Smoking greater than 20 pack years 04/18/2020   Eczema 01/18/2020   Colon cancer screening 12/09/2018   Essential hypertension 09/09/2018   Type 2 diabetes mellitus with hyperglycemia, with long-term current use of insulin (HCC) 09/09/2018   CAD (coronary artery disease), native coronary artery 09/09/2018   History of CVA (cerebrovascular accident) 09/09/2018   Mixed hyperlipidemia 02/05/2018   Reggy Eye, PT  Mariaelena Cade, PT 06/15/2021, 2:06 PM  [facility] [place] [address] [place], [place], [address] Phone: [phone]   Fax:  [phone]  Name: Erik Rose MRN: [id number] Date of Birth: Sep 17, 1956

## 2021-06-18 NOTE — Telephone Encounter (Signed)
LVM informing pt that forms were faxed last week to [employer].  Tiajuana Amass, CMA

## 2021-06-20 ENCOUNTER — Ambulatory Visit: Payer: Medicare Other | Admitting: Physical Therapy

## 2021-06-20 ENCOUNTER — Other Ambulatory Visit: Payer: Self-pay

## 2021-06-20 DIAGNOSIS — M25551 Pain in right hip: Secondary | ICD-10-CM | POA: Diagnosis not present

## 2021-06-20 DIAGNOSIS — G8929 Other chronic pain: Secondary | ICD-10-CM

## 2021-06-20 DIAGNOSIS — M6281 Muscle weakness (generalized): Secondary | ICD-10-CM

## 2021-06-20 DIAGNOSIS — M5440 Lumbago with sciatica, unspecified side: Secondary | ICD-10-CM | POA: Diagnosis not present

## 2021-06-20 DIAGNOSIS — M5441 Lumbago with sciatica, right side: Secondary | ICD-10-CM

## 2021-06-20 NOTE — Therapy (Signed)
[facility] [place] [address] [place], [place], [address] Phone: [phone]   Fax:  [phone]  Physical Therapy Treatment  Patient Details  Name: Erik Rose MRN: [id number] Date of Birth: 04-05-1956 Referring Provider (PT): Rodney Langton, MD   Encounter Date: 06/20/2021   PT End of Session - 06/20/21 1548     Visit Number 5    Number of Visits 12    Date for PT Re-Evaluation 07/13/21    Authorization Type UHC medicare    Authorization - Visit Number 5    Progress Note Due on Visit 10    PT Start Time 1510    PT Stop Time 1550    PT Time Calculation (min) 40 min    Activity Tolerance Patient tolerated treatment well    Behavior During Therapy [facility] for tasks assessed/performed             Past Medical History:  Diagnosis Date   Diabetes mellitus without complication (HCC)    Diverticulitis    GERD (gastroesophageal reflux disease)    Heart disease    Hypertension    Stroke (HCC)    TIA (transient ischemic attack)     Past Surgical History:  Procedure Laterality Date   CARDIAC CATHETERIZATION     CORONARY ANGIOPLASTY WITH STENT PLACEMENT  2010    There were no vitals filed for this visit.   Subjective Assessment - 06/20/21 1513     Subjective Pt states he feels "tight" in his hamstrings but that his back pain has decreased    Patient Stated Goals "be able to get up in the morning without this pain"    Currently in Pain? No/denies                [facility] PT Assessment - 06/20/21 0001       Assessment   Medical Diagnosis spinal stenosis of the lumbar region    Referring Provider (PT) Rodney Langton, MD    Onset Date/Surgical Date 05/30/21    Hand Dominance Left    Next MD Visit 07/20/21      AROM   Lumbar Flexion limited 50%    Lumbar Extension WFL    Lumbar - Right Side Bend limited 25%    Lumbar - Left Side Bend limited 25%    Lumbar - Right Rotation WFL    Lumbar -  Left Rotation [facility]                           [facility] Adult PT Treatment/Exercise - 06/20/21 0001       Lumbar Exercises: Stretches   Passive Hamstring Stretch Right;Left;2 reps;20 seconds   seated with hip hinge.   Hip Flexor Stretch Right;Left;2 reps;20 seconds   seated   Quad Stretch Right;Left;3 reps;10 seconds    Quad Stretch Limitations childs pose    Piriformis Stretch Right;Left;2 reps;20 seconds    Other Lumbar Stretch Exercise childs pose with lateral flexion both directions x 30sec      Lumbar Exercises: Aerobic   Nustep level 5 x 5 min for warm up      Lumbar Exercises: Standing   Other Standing Lumbar Exercises chops red TB x 10 bilat, pallof press x 10 bilat red TB    Other Standing Lumbar Exercises wall slide x 10 holding 10#KB      Lumbar Exercises: Seated   Sit to Stand 10 reps   with 10# KB, focus on  eccentric lowering                    PT Education - 06/20/21 1548     Education Details updated HEP    Person(s) Educated Patient    Methods Explanation;Demonstration;Handout    Comprehension Verbalized understanding;Returned demonstration                 PT Long Term Goals - 06/13/21 1446       PT LONG TERM GOAL #1   Title The patient will be indep with HEP for LE strength, flexibility and walking.    Time 6    Period Weeks    Status On-going      PT LONG TERM GOAL #2   Title The patient will improve functional status score from 65% up to 72%.    Time 6    Period Weeks    Status On-going      PT LONG TERM GOAL #3   Title The patient will report ambulating for 1 mile without experience "weakness" in legs.    Baseline Patient c/o sensation that he has to sit down due to weakness with walking.    Time 6    Period Weeks    Status On-going      PT LONG TERM GOAL #4   Title The patient will report pain in R buttocks < or equal to 2/10.    Time 6    Period Weeks    Status On-going                   Plan  - 06/20/21 1548     Clinical Impression Statement Pt with decreased pain and improved flexibility today. Decreased frequency to 1x per week, updated HEP and progressed core strength    PT Next Visit Plan progress core strength    PT Home Exercise Plan [facility]    Consulted and Agree with Plan of Care Patient             Patient will benefit from skilled therapeutic intervention in order to improve the following deficits and impairments:     Visit Diagnosis: Chronic bilateral low back pain with sciatica, sciatica laterality unspecified  Pain in right hip  Muscle weakness (generalized)     Problem List Patient Active Problem List   Diagnosis Date Noted   Lumbar spinal stenosis 03/15/2021   Pancreatitis 12/10/2020   Cervical radiculopathy 04/18/2020   Smoking greater than 20 pack years 04/18/2020   Eczema 01/18/2020   Colon cancer screening 12/09/2018   Essential hypertension 09/09/2018   Type 2 diabetes mellitus with hyperglycemia, with long-term current use of insulin (HCC) 09/09/2018   CAD (coronary artery disease), native coronary artery 09/09/2018   History of CVA (cerebrovascular accident) 09/09/2018   Mixed hyperlipidemia 02/05/2018    Chavonne Sforza, PT 06/20/2021, 3:52 PM  [facility] [address] [place], [place], [address] Phone: [phone]   Fax:  [phone]  Name: Erik Rose MRN: [id number] Date of Birth: 1956-09-12

## 2021-06-20 NOTE — Patient Instructions (Signed)
Access Code: [facility] URL: [email or web address] Date: 06/20/2021 Prepared by: Reggy Eye  Exercises Wall Quarter Squat - 2 x daily - 7 x weekly - 1 sets - 10 reps Seated Hamstring Stretch - 2 x daily - 7 x weekly - 1 sets - 2-3 reps - 20 seconds hold Seated Piriformis Stretch - 1 x daily - 7 x weekly - 3 sets - 2-3 reps - 20 seconds hold Seated Hip Flexor Stretch - 1 x daily - 7 x weekly - 3 sets - 2-3 reps - 20 sec hold Child's Pose Stretch - 1 x daily - 7 x weekly - 3 sets - 2-3 reps - 20 sec hold Child's Pose with Sidebending - 1 x daily - 7 x weekly - 3 sets - 2-3 reps - 20-30 sec hold

## 2021-06-22 ENCOUNTER — Telehealth: Payer: Self-pay | Admitting: Pharmacist

## 2021-06-22 ENCOUNTER — Encounter: Payer: Medicare Other | Admitting: Physical Therapy

## 2021-06-22 NOTE — Chronic Care Management (AMB) (Signed)
Chronic Care Management Pharmacy Assistant   Name: Erik Rose  MRN: [id number] DOB: 10/25/1955  Erik Rose is an 65 y.o. year old male who presents for his initial CCM visit with the clinical pharmacist.  Recent office visits:  05/30/21-Erik J. Dianah Field, MD. Erik Rose for lumbar spinal stenosis. Start prednisone 50 mg for 5 days. Increase gabapentin 300 mg. Follow up in 4-6 weeks. 05/09/21-Erik Zigmund Daniel, DO (PCP) Seen for general follow up.  Increase Lantus to 35 units twice daily.  Continue Humalog 15 units 3 times daily.  Adding metformin XR back on at 1000 mg daily. Follow up in 3 months. 03/15/21-Erik Zigmund Daniel, DO (PCP) Seen for hip pain. Start prednisone taper and tizanidine as needed. 02/05/21-Erik Zigmund Daniel, DO (PCP) General follow up. Start on amlodipine 5 mg. Increase lantus to 60 units split to 30 units BID. New neurosurgery referral entered. Follow up in 3 months.  Recent consult visits:  06/20/21-Erik Rose, PT (Rehabilitation) 06/15/21-Erik Rose, PT (Rehabilitation) 06/13/21-Erik Rose (Rehabilitation) 06/06/21-Erik Rose, PT (Rehabilitation) 06/01/21-Erik Rose, PT (Rehabilitation) 03/12/21-Erik Renae Fickle, MD (Cardiology) Seen for general follow up. Stopped ozempic due to pancreatitis. Follow up in 8 months. 02/28/21-[facility]. Notes not available. 02/14/21-Erik Rose (Neurosurgery) Notes not available.  Hospital visits:  None in previous 6 months  Medications: Outpatient Encounter Medications as of 06/22/2021  Medication Sig   amLODipine (NORVASC) 5 MG tablet Take 1 tablet (5 mg total) by mouth daily.   aspirin EC 81 MG tablet Take 1 tablet (81 mg total) by mouth daily. Swallow whole.   atorvastatin (LIPITOR) 80 MG tablet Take 1 tablet (80 mg total) by mouth daily.   Blood Glucose Monitoring Suppl (ONE TOUCH ULTRA 2) w/Device KIT Use to take blood sugar one to two  times daily   carvedilol (COREG) 12.5 MG tablet TAKE 1 TABLET(12.5 MG) BY MOUTH TWICE DAILY WITH A MEAL   Esomeprazole Magnesium (NEXIUM PO) Take by mouth.   gabapentin (NEURONTIN) 300 MG capsule One tab PO qHS for a week, then BID for a week, then TID. May double weekly to a max of 3,660m/day   insulin glargine (LANTUS SOLOSTAR) 100 UNIT/ML Solostar Pen ADMINISTER 55 UNITS UNDER THE SKIN DAILY   insulin lispro (HUMALOG KWIKPEN) 100 UNIT/ML KwikPen INJECT 15 UNITS INTO THE SKIN 3 TIMES DAILY   lisinopril (ZESTRIL) 40 MG tablet Take 1 tablet (40 mg total) by mouth daily.   metFORMIN (GLUCOPHAGE XR) 500 MG 24 hr tablet Take 2 tablets (1,000 mg total) by mouth daily with breakfast.   ONETOUCH ULTRA test strip USE TO CHECK BLOOD SUGAR TWICE DAILY   OVER THE COUNTER MEDICATION Take 1 tablet by mouth daily. ALLERGY PILL   predniSONE (DELTASONE) 50 MG tablet One tab PO daily for 5 days.   No facility-administered encounter medications on file as of 06/22/2021.   AmLODipine (NORVASC) 5 MG tablet Last filled:02/05/21 90 DS Aspirin EC 81 MG tablet Last filled:None noted Atorvastatin (LIPITOR) 80 MG tablet Last filled:02/28/21 90 DS Carvedilol (COREG) 12.5 MG tablet Last filled:01/13/21 90 DS Esomeprazole Magnesium (NEXIUM PO) Last filled:None noted Gabapentin (NEURONTIN) 300 MG capsule Last filled:05/30/21 42 DS Insulin glargine (LANTUS SOLOSTAR) 100 UNIT/ML Solostar Pen Last filled:11/30/20 90 DS Insulin lispro (HUMALOG KWIKPEN) 100 UNIT/ML KwikPen Last filled:02/14/20 33 DS Lisinopril (ZESTRIL) 40 MG tablet Last filled:12/09/20 90 DS MetFORMIN (GLUCOPHAGE XR) 500 MG 24 hr Last filled:None noted   Star Rating Drugs: Lisinopril (ZESTRIL) 40 MG tablet Last filled:12/09/20 90 DS MetFORMIN (GLUCOPHAGE  XR) 500 MG 24 hr Last filled:None noted Atorvastatin (LIPITOR) 80 MG tablet Last filled:02/28/21 90 DS  [facility] gaps Hepatitis C Screening:Never done COVID-19 Vaccine:Last completed: Jul 19, 2020 OPHTHALMOLOGY EXAM:Last completed: Jun 21, 2020  Erik Rose, [place]

## 2021-06-23 ENCOUNTER — Ambulatory Visit (HOSPITAL_COMMUNITY)
Admission: EM | Admit: 2021-06-23 | Discharge: 2021-06-23 | Disposition: A | Discharge status: Home / Self Care | Payer: Medicare Other | Attending: Emergency Medicine | Admitting: Emergency Medicine

## 2021-06-23 ENCOUNTER — Other Ambulatory Visit: Payer: Self-pay

## 2021-06-23 ENCOUNTER — Encounter (HOSPITAL_COMMUNITY): Payer: Self-pay | Admitting: *Deleted

## 2021-06-23 DIAGNOSIS — M48061 Spinal stenosis, lumbar region without neurogenic claudication: Secondary | ICD-10-CM | POA: Diagnosis not present

## 2021-06-23 DIAGNOSIS — M5432 Sciatica, left side: Secondary | ICD-10-CM | POA: Diagnosis not present

## 2021-06-23 MED ORDER — PREDNISONE 50 MG PO TABS: ORAL_TABLET | ORAL | 0 refills | Status: DC

## 2021-06-23 NOTE — ED Provider Notes (Signed)
[place]    CSN: [id number] Arrival date & time: 06/23/21  1655      History   Chief Complaint Chief Complaint  Patient presents with   pain    HPI Erik Rose is a 65 y.o. male.   Patient here for evaluation of left buttock and hip pain that has been ongoing for the past several days.  Patient does report a history of spinal stenosis and follows up with orthopedics.  Patient had similar pain in the right hip that improved with prednisone and physical therapy.  Reports taking OTC pain medication with minimal symptom relief.  Reports pain is worse with movement or when bearing weight.  Denies any trauma, injury, or other precipitating event.  Denies any fevers, chest pain, shortness of breath, N/V/D, numbness, tingling, weakness, abdominal pain, or headaches.    The history is provided by the patient.   Past Medical History:  Diagnosis Date   Diabetes mellitus without complication (HCC)    Diverticulitis    GERD (gastroesophageal reflux disease)    Heart disease    Hypertension    Stroke [facility])    TIA (transient ischemic attack)     Patient Active Problem List   Diagnosis Date Noted   Lumbar spinal stenosis 03/15/2021   Pancreatitis 12/10/2020   Cervical radiculopathy 04/18/2020   Smoking greater than 20 pack years 04/18/2020   Eczema 01/18/2020   Colon cancer screening 12/09/2018   Essential hypertension 09/09/2018   Type 2 diabetes mellitus with hyperglycemia, with long-term current use of insulin ([place]) 09/09/2018   CAD (coronary artery disease), native coronary artery 09/09/2018   History of CVA (cerebrovascular accident) 09/09/2018   Mixed hyperlipidemia 02/05/2018    Past Surgical History:  Procedure Laterality Date   [place]  2010       Home Medications    Prior to Admission medications   Medication Sig Start Date End Date Taking? Authorizing Provider  amLODipine  (NORVASC) 5 MG tablet Take 1 tablet (5 mg total) by mouth daily. 02/05/21   [name], DO  aspirin EC 81 MG tablet Take 1 tablet (81 mg total) by mouth daily. Swallow whole. 08/10/20   Freada Bergeron, MD  atorvastatin (LIPITOR) 80 MG tablet Take 1 tablet (80 mg total) by mouth daily. 07/19/20   [name], DO  Blood Glucose Monitoring Suppl (ONE TOUCH ULTRA 2) w/Device KIT Use to take blood sugar one to two times daily 09/18/18   [name], DO  carvedilol (COREG) 12.5 MG tablet TAKE 1 TABLET(12.5 MG) BY MOUTH TWICE DAILY WITH A MEAL 04/24/21   [name], DO  Esomeprazole Magnesium (NEXIUM PO) Take by mouth.    [provider]  gabapentin (NEURONTIN) 300 MG capsule One tab PO qHS for a week, then BID for a week, then TID. May double weekly to a max of 3,642m/day 05/30/21   TSilverio Decamp MD  insulin glargine (LANTUS SOLOSTAR) 100 UNIT/ML Solostar Pen ADMINISTER 55 UNITS UNDER THE SKIN DAILY 05/29/21   MLuetta Nutting DO  insulin lispro (HUMALOG KWIKPEN) 100 UNIT/ML KwikPen INJECT 15 UNITS INTO THE SKIN 3 TIMES DAILY 12/14/20   MLuetta Nutting DO  lisinopril (ZESTRIL) 40 MG tablet Take 1 tablet (40 mg total) by mouth daily. 09/11/20   PFreada Bergeron MD  metFORMIN (GLUCOPHAGE XR) 500 MG 24 hr tablet Take 2 tablets (1,000 mg total) by mouth daily with breakfast. 05/09/21   MLuetta Nutting  DO  ONETOUCH ULTRA test strip USE TO CHECK BLOOD SUGAR TWICE DAILY 01/23/21   [name], DO  OVER THE COUNTER MEDICATION Take 1 tablet by mouth daily. ALLERGY PILL    [provider]  predniSONE (DELTASONE) 50 MG tablet One tab PO daily for 5 days. 06/23/21   Pearson Forster, NP    Family History Family History  Problem Relation Age of Onset   Hypertension Mother    Kidney disease Mother    Miscarriages / [place] Mother    Stroke Mother    Heart disease Father    Depression Sister    Kidney disease Sister    Diabetes Brother    Hypertension Brother     Hyperlipidemia Brother    Kidney disease Brother     Social History Social History   Tobacco Use   Smoking status: Some Days    Packs/day: 0.50    Types: Cigarettes   Smokeless tobacco: Never  Vaping Use   Vaping Use: Never used  Substance Use Topics   Alcohol use: Not Currently    Comment: quit 30 yrs ago   Drug use: Not Currently    Types: Marijuana, Cocaine    Comment: stopped 1991     Allergies   Patient has no known allergies.   Review of Systems Review of Systems  Musculoskeletal:  Positive for arthralgias and back pain.  All other systems reviewed and are negative.   Physical Exam Triage Vital Signs ED Triage Vitals [06/23/21 1715]  Enc Vitals Group     BP (!) 148/79     Pulse Rate 93     Resp 20     Temp 99.2 F (37.3 C)     Temp src      SpO2 100 %     Weight      Height      Head Circumference      Peak Flow      Pain Score 8     Pain Loc      Pain Edu?      Excl. in [place]?    No data found.  Updated Vital Signs BP (!) 148/79   Pulse 93   Temp 99.2 F (37.3 C)   Resp 20   SpO2 100%   Visual Acuity Right Eye Distance:   Left Eye Distance:   Bilateral Distance:    Right Eye Near:   Left Eye Near:    Bilateral Near:     Physical Exam Vitals and nursing note reviewed.  Constitutional:      General: He is not in acute distress.    Appearance: Normal appearance. He is not ill-appearing, toxic-appearing or diaphoretic.  HENT:     Head: Normocephalic and atraumatic.  Eyes:     Conjunctiva/sclera: Conjunctivae normal.  Cardiovascular:     Rate and Rhythm: Normal rate.     Pulses: Normal pulses.  Pulmonary:     Effort: Pulmonary effort is normal.  Abdominal:     General: Abdomen is flat.  Musculoskeletal:     Cervical back: Normal range of motion.     Lumbar back: Spasms and tenderness present. No bony tenderness. Positive left straight leg raise test.  Skin:    General: Skin is warm and dry.  Neurological:     General: No  focal deficit present.     Mental Status: He is alert and oriented to person, place, and time.  Psychiatric:        Mood  and Affect: Mood normal.     UC Treatments / Results  Labs (all labs ordered are listed, but only abnormal results are displayed) Labs Reviewed - No data to display  EKG   Radiology No results found.  Procedures Procedures (including critical care time)  Medications Ordered in UC Medications - No data to display  Initial Impression / Assessment and Plan / UC Course  I have reviewed the triage vital signs and the nursing notes.  Pertinent labs & imaging results that were available during my care of the patient were reviewed by me and considered in my medical decision making (see chart for details).    Assessment negative for red flags or concerns.  Likely sciatica of the left side.  We will treat with prednisone daily for the next 5 days.  May also take Tylenol as needed.  May use heat, ice, or other OTC pain relief medications as needed.  Recommend following up with PCP and orthopedics as soon as possible for reevaluation. Final Clinical Impressions(s) / UC Diagnoses   Final diagnoses:  Sciatica of left side  Spinal stenosis of lumbar region without neurogenic claudication     Discharge Instructions      Take one prednisone a day for the next 5 days.   You can take Tylenol as needed for pain.   You can use heat, ice, or alternate between heat and ice for comfort.  You can try IcyHot, lidocaine patches, biofreeze, aspercreme, bengay, or Voltaren gel as needed for pain relief.   Follow up with your primary care provider and orthopedic doctor as scheduled.      ED Prescriptions     Medication Sig Dispense Auth. Provider   predniSONE (DELTASONE) 50 MG tablet One tab PO daily for 5 days. 5 tablet Pearson Forster, NP      PDMP not reviewed this encounter.   Pearson Forster, NP 06/23/21 [phone]

## 2021-06-23 NOTE — Discharge Instructions (Addendum)
Take one prednisone a day for the next 5 days.   You can take Tylenol as needed for pain.   You can use heat, ice, or alternate between heat and ice for comfort.  You can try IcyHot, lidocaine patches, biofreeze, aspercreme, bengay, or Voltaren gel as needed for pain relief.   Follow up with your primary care provider and orthopedic doctor as scheduled.

## 2021-06-23 NOTE — ED Triage Notes (Signed)
Pt reports pain on lt side but reports it is not his hip. Pt points to hip region.

## 2021-06-26 ENCOUNTER — Ambulatory Visit (INDEPENDENT_AMBULATORY_CARE_PROVIDER_SITE_OTHER): Payer: Medicare Other | Admitting: Pharmacist

## 2021-06-26 ENCOUNTER — Other Ambulatory Visit: Payer: Self-pay

## 2021-06-26 ENCOUNTER — Encounter: Payer: Medicare Other | Admitting: Physical Therapy

## 2021-06-26 DIAGNOSIS — I10 Essential (primary) hypertension: Secondary | ICD-10-CM

## 2021-06-26 DIAGNOSIS — E1165 Type 2 diabetes mellitus with hyperglycemia: Secondary | ICD-10-CM

## 2021-06-26 DIAGNOSIS — E782 Mixed hyperlipidemia: Secondary | ICD-10-CM

## 2021-06-26 NOTE — Progress Notes (Signed)
Chronic Care Management Pharmacy Note  06/26/2021 Name:  Erik Rose MRN:  [id number] DOB:  1956-01-17  Summary: addressed DM, HTN, HLD. Patient uses patient assistance programs for both lantus and lispro & is a great self-advocate of paperwork & ongoing needs. A1c uptrend due to coming off of ozempic due to pancreatitis. Since has been addressed via adding back metformin & adjusting insulin.  Recommendations/Changes made from today's visit: none  Plan: f/u with pharmacist in 2 months  Subjective: Erik Rose is an 65 y.o. year old male who is a primary patient of [name], DO.  The CCM team was consulted for assistance with disease management and care coordination needs.    Engaged with patient by telephone for initial visit in response to provider referral for pharmacy case management and/or care coordination services.   Consent to Services:  The patient was given information about Chronic Care Management services, agreed to services, and gave verbal consent prior to initiation of services.  Please see initial visit note for detailed documentation.   Patient Care Team: [name], DO as PCP - General (Family Medicine) Freada Bergeron, MD as PCP - Cardiology (Cardiology) [name], [facility] as Pharmacist (Pharmacist)  Recent office visits:  05/30/21-Thomas J. Dianah Field, MD. Lorenda Cahill for lumbar spinal stenosis. Start prednisone 50 mg for 5 days. Increase gabapentin 300 mg. Follow up in 4-6 weeks. 05/09/21-Cody Zigmund Daniel, DO (PCP) Seen for general follow up.  Increase Lantus to 35 units twice daily.  Continue Humalog 15 units 3 times daily.  Adding metformin XR back on at 1000 mg daily. Follow up in 3 months. 03/15/21-Cody Zigmund Daniel, DO (PCP) Seen for hip pain. Start prednisone taper and tizanidine as needed. 02/05/21-Cody Zigmund Daniel, DO (PCP) General follow up. Start on amlodipine 5 mg. Increase lantus to 60 units split to 30 units BID. New neurosurgery referral  entered. Follow up in 3 months.   Recent consult visits:  06/20/21-Karen K. Donawerth, PT (Rehabilitation) 06/15/21-Karen K. Donawerth, PT (Rehabilitation) 06/13/21-Jennifer L. [place] (Rehabilitation) 06/06/21-Karen K. Donawerth, PT (Rehabilitation) 06/01/21-Christina M. Kathlen Mody, PT (Rehabilitation) 03/12/21-Heather Renae Fickle, MD (Cardiology) Seen for general follow up. Stopped ozempic due to pancreatitis. Follow up in 8 months. 02/28/21-[facility]. Notes not available. 02/14/21-Neelesh Nundkumar (Neurosurgery) Notes not available.   Hospital visits:  None in previous 6 months  Objective:  Lab Results  Component Value Date   CREATININE 0.77 11/18/2020   CREATININE 0.93 01/18/2020   CREATININE 0.99 05/12/2019    Lab Results  Component Value Date   HGBA1C 11.2 (A) 05/09/2021   Last diabetic Eye exam: No results found for: HMDIABEYEEXA  Last diabetic Foot exam: No results found for: HMDIABFOOTEX      Component Value Date/Time   CHOL 138 09/14/2020 1133   TRIG 63 09/14/2020 1133   HDL 53 09/14/2020 1133   CHOLHDL 2.6 09/14/2020 1133   CHOLHDL 3.3 01/18/2020 1051   LDLCALC 72 09/14/2020 1133   LDLCALC 74 01/18/2020 1051   LDLDIRECT 112.0 09/09/2018 1513    Hepatic Function Latest Ref Rng & Units 11/18/2020 01/18/2020 09/09/2018  Total Protein 6.5 - 8.1 g/dL 7.4 7.2 7.4  Albumin 3.5 - 5.0 g/dL 3.7 - 4.3  AST 15 - 41 U/L _0 ALT 0 - 44 U/L _1 Alk Phosphatase 38 - 126 U/L 92 - 98  Total Bilirubin 0.3 - 1.2 mg/dL 0.5 0.6 0.6    No results found for: TSH, FREET4  CBC Latest Ref Rng & Units 11/18/2020  01/18/2020 09/09/2018  WBC 4.0 - 10.5 K/uL 10.4 8.6 8.0  Hemoglobin 13.0 - 17.0 g/dL 12.8(L) 14.8 12.9(L)  Hematocrit 39.0 - 52.0 % 38.1(L) 44.1 40.1  Platelets 150 - 400 K/uL 226 191 252.0    No results found for: VD25OH  Clinical ASCVD: Yes  The 10-year ASCVD risk score (Arnett DK, et al., 2019) is: 30.3%   Values used to  calculate the score:     Age: 85 years     Sex: Male     Is Non-Hispanic African American: No     Diabetic: Yes     Tobacco smoker: Yes     Systolic Blood Pressure: 017 mmHg     Is BP treated: Yes     HDL Cholesterol: 53 mg/dL     Total Cholesterol: 138 mg/dL    Other: (CHADS2VASc if Afib, PHQ9 if depression, MMRC or CAT for COPD, ACT, DEXA)  Social History   Tobacco Use  Smoking Status Some Days   Packs/day: 0.50   Types: Cigarettes  Smokeless Tobacco Never   BP Readings from Last 3 Encounters:  06/23/21 (!) 148/79  05/09/21 127/75  03/15/21 120/79   Pulse Readings from Last 3 Encounters:  06/23/21 93  05/09/21 81  03/15/21 83   Wt Readings from Last 3 Encounters:  05/09/21 220 lb (99.8 kg)  03/15/21 213 lb (96.6 kg)  03/12/21 211 lb 9.6 oz (96 kg)    Assessment: Review of patient past medical history, allergies, medications, health status, including review of consultants reports, laboratory and other test data, was performed as part of comprehensive evaluation and provision of chronic care management services.   SDOH:  (Social Determinants of Health) assessments and interventions performed:    CCM Care Plan  No Known Allergies  Medications Reviewed Today     Reviewed by Marda Stalker, RN (Registered Nurse) on 06/23/21 at 1717  Med List Status: <None>   Medication Order Taking? Sig Documenting Provider Last Dose Status Informant  amLODipine (NORVASC) 5 MG tablet [id number]  Take 1 tablet (5 mg total) by mouth daily. [name], DO  Active   aspirin EC 81 MG tablet [id number]  Take 1 tablet (81 mg total) by mouth daily. Swallow whole. Freada Bergeron, MD  Active   atorvastatin (LIPITOR) 80 MG tablet [id number]  Take 1 tablet (80 mg total) by mouth daily. [name], DO  Active   Blood Glucose Monitoring Suppl (ONE TOUCH ULTRA 2) w/Device KIT [id number]  Use to take blood sugar one to two times daily [name], DO  Active   carvedilol (COREG)  12.5 MG tablet [id number]  TAKE 1 TABLET(12.5 MG) BY MOUTH TWICE DAILY WITH A MEAL [name], DO  Active   Esomeprazole Magnesium (NEXIUM PO) [id number]  Take by mouth. [provider]  Active   gabapentin (NEURONTIN) 300 MG capsule [id number]  One tab PO qHS for a week, then BID for a week, then TID. May double weekly to a max of 3,665m/day TSilverio Decamp MD  Active   insulin glargine (LANTUS SOLOSTAR) 100 UNIT/ML Solostar Pen [id number] ADMINISTER 55 UNITS UNDER THE SKIN DAILY MLuetta Nutting DO  Active   insulin lispro (HUMALOG KWIKPEN) 100 UNIT/ML KwikPen [id number] INJECT 15 UNITS INTO THE SKIN 3 TIMES DAILY MLuetta Nutting DO  Active   lisinopril (ZESTRIL) 40 MG tablet [id number] Take 1 tablet (40 mg total) by mouth daily. PFreada Bergeron MD  Active   metFORMIN (GLUCOPHAGE XR) 500 MG  24 hr tablet [id number]  Take 2 tablets (1,000 mg total) by mouth daily with breakfast. [name], DO  Active   [facility] ULTRA test strip [id number]  USE TO CHECK BLOOD SUGAR TWICE DAILY [name], DO  Active   OVER THE COUNTER MEDICATION [id number]  Take 1 tablet by mouth daily. ALLERGY PILL [provider]  Active   predniSONE (DELTASONE) 50 MG tablet [id number]  One tab PO daily for 5 days. Silverio Decamp, MD  Active             Patient Active Problem List   Diagnosis Date Noted   Lumbar spinal stenosis 03/15/2021   Pancreatitis 12/10/2020   Cervical radiculopathy 04/18/2020   Smoking greater than 20 pack years 04/18/2020   Eczema 01/18/2020   Colon cancer screening 12/09/2018   Essential hypertension 09/09/2018   Type 2 diabetes mellitus with hyperglycemia, with long-term current use of insulin ([place]) 09/09/2018   CAD (coronary artery disease), native coronary artery 09/09/2018   History of CVA (cerebrovascular accident) 09/09/2018   Mixed hyperlipidemia 02/05/2018    Immunization History  Administered Date(s) Administered   Influenza,  Quadrivalent, Recombinant, Inj, Pf 07/17/2018   Influenza,inj,Quad PF,6+ Mos 06/10/2017, 06/22/2019, 07/19/2020   Influenza-Unspecified 06/10/2017   PFIZER(Purple Top)SARS-COV-2 Vaccination 12/23/2019, 01/06/2020, 09/23/2020   Pneumococcal Polysaccharide-23 12/09/2018   Tdap 07/19/2020   Zoster Recombinat (Shingrix) 06/10/2017, 05/09/2021    Conditions to be addressed/monitored: HTN, HLD, and DMII  There are no care plans that you recently modified to display for this patient.   Medication Assistance:  Patient uses programs for both lantus and humalog, takes self-responsibility for paperwork & renewals.  Patient's preferred pharmacy is:  [id number] [address], [place] [address] [place] [address] Phone: [phone] Fax: [phone]  Uses pill box? Yes Pt endorses 100% compliance  Follow Up:  Patient agrees to Care Plan and Follow-up.  Plan: Telephone follow up appointment with care management team member scheduled for:  2 weeks  [name]

## 2021-06-26 NOTE — Patient Instructions (Signed)
Visit Information   PATIENT GOALS:   Goals Addressed             This Visit's Progress    Medication Management       Patient Goals/Self-Care Activities Over the next 60 days, patient will:  take medications as prescribed  Follow Up Plan: Telephone follow up appointment with care management team member scheduled for:  2 months         Consent to CCM Services: Erik Rose was given information about Chronic Care Management services including:  CCM service includes personalized support from designated clinical staff supervised by his physician, including individualized plan of care and coordination with other care providers 24/7 contact phone numbers for assistance for urgent and routine care needs. Service will only be billed when office clinical staff spend 20 minutes or more in a month to coordinate care. Only one practitioner may furnish and bill the service in a calendar month. The patient may stop CCM services at any time (effective at the end of the month) by phone call to the office staff. The patient will be responsible for cost sharing (co-pay) of up to 20% of the service fee (after annual deductible is met).  Patient agreed to services and verbal consent obtained.   Patient verbalizes understanding of instructions provided today and agrees to view in [place].   Telephone follow up appointment with care management team member scheduled for: 2 months  Erik Rose   CLINICAL CARE PLAN: Patient Care Plan: Medication Management     Problem Identified: DM, HTN, HLD      Long-Range Goal: Disease Progression Prevention   Start Date: 06/26/2021  This Visit's Progress: On track  Priority: High  Note:   Current Barriers:  None at present Cost: patient uses patient assistance programs for lantus & humalog  Pharmacist Clinical Goal(s):  Over the next 60 days, patient will adhere to plan to optimize therapeutic regimen for chronic conditions as evidenced by  report of adherence to recommended medication management changes through collaboration with PharmD and provider.   Interventions: 1:1 collaboration with Erik Nutting, DO regarding development and update of comprehensive plan of care as evidenced by provider attestation and co-signature Inter-disciplinary care team collaboration (see longitudinal plan of care) Comprehensive medication review performed; medication list updated in electronic medical record  Diabetes:  Uncontrolled; current treatment:lantus 55 units, humalog 15 units TID, metformin XR 1074m daily ; a1c 11.2  Current glucose readings: fasting glucose: 90-110, post prandial glucose: 120-180  Reports hypoglycemic symptoms when <100  Current meal patterns: to be discussed at future visits  Current exercise: stays busy flipping houses  Counseled on BG goal values,  Recommended continue current regimen,  Hypertension:  Controlled; current treatment: amlodipine 558mdaily, carvedilol 12.44m69mID, lisinopril 54m51mily;   Current home readings: 120-130/70s  Denies hypotensive/hypertensive symptoms  Counseled on BP goals Recommended continue current regimen, Hyperlipidemia:  Controlled; current treatment:atorvastatin 80mg77mly; LDL 72  Recommended continue current regimen  Patient Goals/Self-Care Activities Over the next 60 days, patient will:  take medications as prescribed  Follow Up Plan: Telephone follow up appointment with care management team member scheduled for:  2 months

## 2021-06-27 ENCOUNTER — Ambulatory Visit: Payer: Medicare Other | Admitting: Rehabilitative and Restorative Service Providers"

## 2021-06-27 DIAGNOSIS — M25551 Pain in right hip: Secondary | ICD-10-CM | POA: Diagnosis not present

## 2021-06-27 DIAGNOSIS — M5440 Lumbago with sciatica, unspecified side: Secondary | ICD-10-CM

## 2021-06-27 DIAGNOSIS — G8929 Other chronic pain: Secondary | ICD-10-CM

## 2021-06-27 DIAGNOSIS — M6281 Muscle weakness (generalized): Secondary | ICD-10-CM

## 2021-06-27 DIAGNOSIS — M5441 Lumbago with sciatica, right side: Secondary | ICD-10-CM

## 2021-06-27 NOTE — Patient Instructions (Signed)
Access Code: [facility] URL: [email or web address] Date: 06/27/2021 Prepared by: Margretta Ditty  Exercises Wall Squat - 2 x daily - 7 x weekly - 1 sets - 10 reps Single Leg Stance - 2 x daily - 7 x weekly - 1 sets - 3 reps - 10-15 seconds hold Seated Hamstring Stretch - 2 x daily - 7 x weekly - 1 sets - 2-3 reps - 20 seconds hold Seated Piriformis Stretch - 1 x daily - 7 x weekly - 3 sets - 2-3 reps - 20 seconds hold Seated Hip Flexor Stretch - 1 x daily - 7 x weekly - 3 sets - 2-3 reps - 20 sec hold Supine Bridge - 2 x daily - 7 x weekly - 1 sets - 10-15 reps Child's Pose Stretch - 1 x daily - 7 x weekly - 3 sets - 2-3 reps - 20 sec hold Child's Pose with Sidebending - 1 x daily - 7 x weekly - 3 sets - 2-3 reps - 20-30 sec hold

## 2021-06-27 NOTE — Therapy (Signed)
[facility] [place] [address] [address] [place], [place], [address] Phone: [phone]   Fax:  [phone]  Physical Therapy Treatment  Patient Details  Name: Erik Rose MRN: [id number] Date of Birth: [birth year] Referring Provider (PT): Rodney Langton, MD   Encounter Date: 06/27/2021   PT End of Session - 06/27/21 1034     Visit Number 6    Number of Visits 12    Date for PT Re-Evaluation 07/13/21    Authorization Type UHC medicare    Authorization - Visit Number 5    Progress Note Due on Visit 10    PT Start Time 1020    PT Stop Time 1100    PT Time Calculation (min) 40 min    Activity Tolerance Patient tolerated treatment well    Behavior During Therapy WFL for tasks assessed/performed             Past Medical History:  Diagnosis Date   Diabetes mellitus without complication (HCC)    Diverticulitis    GERD (gastroesophageal reflux disease)    Heart disease    Hypertension    Stroke (HCC)    TIA (transient ischemic attack)     Past Surgical History:  Procedure Laterality Date   CARDIAC CATHETERIZATION     CORONARY ANGIOPLASTY WITH STENT PLACEMENT  2010    There were no vitals filed for this visit.   Subjective Assessment - 06/27/21 1020     Subjective The patieint reports the R side was great, but Thursday he developed pain in the left side.  He took steroid and pain resolved.  "Since I've been here, I don't have much pain."  He notes he barely notices pain now.  Patient notes in a store if he is standing he gets numbness in the butt and then he gets a weakness.    Pertinent History HTN, diabetes, CAD    Patient Stated Goals "be able to get up in the morning without this pain"    Currently in Pain? No/denies                [facility] PT Assessment - 06/27/21 1034       Assessment   Medical Diagnosis spinal stenosis of the lumbar region    Referring Provider (PT) Rodney Langton, MD     Onset Date/Surgical Date 05/30/21                           [facility] Adult PT Treatment/Exercise - 06/27/21 1034       Ambulation/Gait   Ambulation/Gait Yes    Ambulation/Gait Assistance 7: Independent    Ambulation Distance (Feet) 1000 Feet    Assistive device None    Gait Comments outdoor walking up/down hills and around building-- tightness in lower legs, but no back/hip discomfort      Lumbar Exercises: Stretches   Passive Hamstring Stretch Right;Left;2 reps;20 seconds    Quad Stretch Right;Left;3 reps;10 seconds    Piriformis Stretch Right;Left;1 rep;30 seconds    Gastroc Stretch Right;Left;2 reps;60 seconds      Lumbar Exercises: Aerobic   Tread Mill 4 minutes at 2.2 mph with UE support      Lumbar Exercises: Standing   Functional Squats 10 reps    Functional Squats Limitations with 10# kettle bell    Wall Slides 10 reps    Other Standing Lumbar Exercises sidestepping with red theraband x 10 reps R and L sides  Other Standing Lumbar Exercises single leg standing x 10-15 seconds dec'ing UE support      Lumbar Exercises: Supine   Bridge 10 reps                          PT Long Term Goals - 06/27/21 1025       PT LONG TERM GOAL #1   Title The patient will be indep with HEP for LE strength, flexibility and walking.    Time 6    Period Weeks    Status On-going      PT LONG TERM GOAL #2   Title The patient will improve functional status score from 65% up to 72%.    Baseline Improved to 88% on 06/27/21    Time 6    Period Weeks    Status Achieved      PT LONG TERM GOAL #3   Title The patient will report ambulating for 1 mile without experience "weakness" in legs.    Baseline Patient c/o sensation that he has to sit down due to weakness with walking.    Time 6    Period Weeks    Status On-going      PT LONG TERM GOAL #4   Title The patient will report pain in R buttocks < or equal to 2/10.    Baseline None today 06/27/21    Time  6    Period Weeks    Status Achieved                   Plan - 06/27/21 1220     Clinical Impression Statement The patient had a flare up of pain last week after working on a ladder.  He responded well to prednisone from that acute visit.  PT continued progressing strengthening and flexibility in today's session.  Patient walked >1000 ft without increased pain in low back or hips today on outdoor surfaces.    PT Treatment/Interventions ADLs/Self Care Home Management;Patient/family education;Taping;Therapeutic activities;Therapeutic exercise;Manual techniques;Electrical Stimulation;Moist Heat;Aquatic Therapy;Traction;Dry needling    PT Next Visit Plan progress core strength    PT Home Exercise Plan [id number]    Consulted and Agree with Plan of Care Patient             Patient will benefit from skilled therapeutic intervention in order to improve the following deficits and impairments:     Visit Diagnosis: Chronic bilateral low back pain with sciatica, sciatica laterality unspecified  Pain in right hip  Muscle weakness (generalized)     Problem List Patient Active Problem List   Diagnosis Date Noted   Lumbar spinal stenosis 03/15/2021   Pancreatitis 12/10/2020   Cervical radiculopathy 04/18/2020   Smoking greater than 20 pack years 04/18/2020   Eczema 01/18/2020   Colon cancer screening 12/09/2018   Essential hypertension 09/09/2018   Type 2 diabetes mellitus with hyperglycemia, with long-term current use of insulin (HCC) 09/09/2018   CAD (coronary artery disease), native coronary artery 09/09/2018   History of CVA (cerebrovascular accident) 09/09/2018   Mixed hyperlipidemia 02/05/2018    Erik Rose, PT 06/27/2021, 12:23 PM  [facility] [place] [address] [place], [place], [address] Phone: [phone]   Fax:  [phone]  Name: Erik Rose MRN: [id number] Date of Birth: 1956-09-11

## 2021-07-04 ENCOUNTER — Encounter: Payer: Medicare Other | Admitting: Physical Therapy

## 2021-07-06 DIAGNOSIS — E782 Mixed hyperlipidemia: Secondary | ICD-10-CM | POA: Diagnosis not present

## 2021-07-06 DIAGNOSIS — Z794 Long term (current) use of insulin: Secondary | ICD-10-CM | POA: Diagnosis not present

## 2021-07-06 DIAGNOSIS — I10 Essential (primary) hypertension: Secondary | ICD-10-CM

## 2021-07-06 DIAGNOSIS — E1165 Type 2 diabetes mellitus with hyperglycemia: Secondary | ICD-10-CM | POA: Diagnosis not present

## 2021-07-11 ENCOUNTER — Other Ambulatory Visit: Payer: Self-pay

## 2021-07-11 ENCOUNTER — Ambulatory Visit: Payer: Medicare Other | Admitting: Sports Medicine

## 2021-07-11 ENCOUNTER — Encounter: Payer: Self-pay | Admitting: Physical Therapy

## 2021-07-11 ENCOUNTER — Ambulatory Visit: Payer: Medicare Other | Admitting: Physical Therapy

## 2021-07-11 DIAGNOSIS — M5440 Lumbago with sciatica, unspecified side: Secondary | ICD-10-CM

## 2021-07-11 DIAGNOSIS — M6281 Muscle weakness (generalized): Secondary | ICD-10-CM | POA: Diagnosis not present

## 2021-07-11 DIAGNOSIS — M25551 Pain in right hip: Secondary | ICD-10-CM | POA: Diagnosis not present

## 2021-07-11 DIAGNOSIS — G8929 Other chronic pain: Secondary | ICD-10-CM

## 2021-07-11 NOTE — Therapy (Addendum)
[facility] [place] [address] [place], [place], [address] Phone: [phone]   Fax:  [phone]  Physical Therapy Treatment and Discharge  Patient Details  Name: Erik Rose MRN: [id number] Date of Birth: [birth year] Referring Provider (PT): Aundria Mems, MD   Encounter Date: 07/11/2021   PT End of Session - 07/11/21 1015     Visit Number 7    Number of Visits 12    Date for PT Re-Evaluation 07/13/21    Authorization Type [place] - Visit Number 7    Progress Note Due on Visit 10    PT Start Time 1224    PT Stop Time 4975    PT Time Calculation (min) 35 min    Activity Tolerance Patient tolerated treatment well    Behavior During Therapy WFL for tasks assessed/performed             Past Medical History:  Diagnosis Date   Diabetes mellitus without complication ([place])    Diverticulitis    GERD (gastroesophageal reflux disease)    Heart disease    Hypertension    Stroke ([place])    TIA (transient ischemic attack)     Past Surgical History:  Procedure Laterality Date   CARDIAC CATHETERIZATION     CORONARY ANGIOPLASTY WITH STENT PLACEMENT  2010    There were no vitals filed for this visit.   Subjective Assessment - 07/11/21 1020     Subjective Pt reports he missed last week due to chest cold.  He only did gentle stretches when he wasn't feeling well.  He notices in the morning, if he doesn't do his "mini stretches", his back tightens up.  He states that he realizes he has to complete stretches long term.     Patient Stated Goals "be able to get up in the morning without this pain"    Currently in Pain? No/denies    Pain Score 0-No pain                [facility] PT Assessment - 07/11/21 0001       Assessment   Medical Diagnosis spinal stenosis of the lumbar region    Referring Provider (PT) Aundria Mems, MD    Onset Date/Surgical Date 05/30/21    Next MD Visit  07/20/21    Prior Therapy none for this issue               [facility] Adult PT Treatment/Exercise - 07/11/21 0001       Lumbar Exercises: Stretches   Single Knee to Chest Stretch Right;Left;2 reps;30 seconds   seated   Hip Flexor Stretch Right;Left;2 reps;30 seconds   seated. leg back   Piriformis Stretch Right;Left;2 reps;30 seconds   seated   Gastroc Stretch Right;Left;2 reps;20 seconds      Lumbar Exercises: Aerobic   Tread Mill 5 minutes at 1.9 mph with UE support on front rail.  occasional cues for posture and to lift feet higher.    Other Aerobic Exercise single laps around gym to decrease stiffness      Lumbar Exercises: Standing   Wall Slides 10 reps;5 seconds    Other Standing Lumbar Exercises single leg standing x 10-15 seconds without UE support x 2 reps each.      Lumbar Exercises: Supine   Bridge 10 reps;4 seconds               PT Long Term Goals - 07/11/21 1031  PT LONG TERM GOAL #1   Title The patient will be indep with HEP for LE strength, flexibility and walking.    Time 6    Period Weeks    Status Achieved      PT LONG TERM GOAL #2   Title The patient will improve functional status score from 65% up to 72%.    Baseline Improved to 88% on 06/27/21    Time 6    Period Weeks    Status Achieved      PT LONG TERM GOAL #3   Title The patient will report ambulating for 1 mile without experience "weakness" in legs.    Baseline Patient c/o sensation that he has to sit down due to weakness with walking (1/2 mile?)    Time 6    Period Weeks    Status Partially Met      PT LONG TERM GOAL #4   Title The patient will report pain in R buttocks < or equal to 2/10.    Baseline None today 06/27/21    Time 6    Period Weeks    Status Achieved                   Plan - 07/11/21 1044     Clinical Impression Statement Pt unable to tolerate walking 1 mile, due to continued weakness feeling; this sensation resolves with rest.  Encouraged pt to  continue walking to build up walking tolerance.  He tolerated all exercises well without production of pain.  Pt pleased with level of function and requests to d/c to HEP.    Rehab Potential Good    PT Treatment/Interventions ADLs/Self Care Home Management;Patient/family education;Taping;Therapeutic activities;Therapeutic exercise;Manual techniques;Electrical Stimulation;Moist Heat;Aquatic Therapy;Traction;Dry needling    PT Next Visit Plan d/c per pt request.    PT Home Exercise Plan [place] and Agree with Plan of Care Patient             Patient will benefit from skilled therapeutic intervention in order to improve the following deficits and impairments:  Pain, Hypomobility, Impaired flexibility, Postural dysfunction, Decreased range of motion, Increased fascial restricitons, Decreased activity tolerance  Visit Diagnosis: Chronic bilateral low back pain with sciatica, sciatica laterality unspecified  Pain in right hip  Muscle weakness (generalized)     Problem List Patient Active Problem List   Diagnosis Date Noted   Lumbar spinal stenosis 03/15/2021   Pancreatitis 12/10/2020   Cervical radiculopathy 04/18/2020   Smoking greater than 20 pack years 04/18/2020   Eczema 01/18/2020   Colon cancer screening 12/09/2018   Essential hypertension 09/09/2018   Type 2 diabetes mellitus with hyperglycemia, with long-term current use of insulin ([place]) 09/09/2018   CAD (coronary artery disease), native coronary artery 09/09/2018   History of CVA (cerebrovascular accident) 09/09/2018   Mixed hyperlipidemia 02/05/2018   PHYSICAL THERAPY DISCHARGE SUMMARY  Visits from Start of Care: 7  Current functional level related to goals / functional outcomes: Improved mobility and strength, decreased pain   Remaining deficits: See above   Education / Equipment: HEP   Patient agrees to discharge. Patient goals were partially met. Patient is being discharged due to being  pleased with the current functional level.  Isabelle Course, PT,DPT10/05/221:36 PM  Kerin Perna, PTA 07/11/21 10:58 AM   [facility] [address] [place], [place], [address] Phone: [phone]   Fax:  [phone]  Name: Jasai Sorg MRN: [id number] Date of Birth: 17-Jan-1956

## 2021-07-16 ENCOUNTER — Ambulatory Visit (INDEPENDENT_AMBULATORY_CARE_PROVIDER_SITE_OTHER): Payer: Medicare Other | Admitting: Sports Medicine

## 2021-07-16 DIAGNOSIS — M48061 Spinal stenosis, lumbar region without neurogenic claudication: Secondary | ICD-10-CM

## 2021-07-16 NOTE — Assessment & Plan Note (Signed)
This is a very pleasant 65 year old male, chronic low back pain, MRI confirms severe spinal stenosis from L3-L5. He has seen Dr. Conchita Paris, he did really well with gabapentin 1 pill daily, aggressive physical therapy. 2 pills at night tend to create excessive sedation. He has an occasional cramp in his calf when waking up but feels as though he can live with this for now, epidurals would be the neck step and decompression with Dr. Conchita Paris would be the last resort, otherwise return to see me as needed.

## 2021-07-16 NOTE — Progress Notes (Signed)
    Procedures performed today:    None.  Independent interpretation of notes and tests performed by another provider:   None.  Brief History, Exam, Impression, and Recommendations:    Lumbar spinal stenosis This is a very pleasant 65 year old male, chronic low back pain, MRI confirms severe spinal stenosis from L3-L5. He has seen Dr. Conchita Paris, he did really well with gabapentin 1 pill daily, aggressive physical therapy. 2 pills at night tend to create excessive sedation. He has an occasional cramp in his calf when waking up but feels as though he can live with this for now, epidurals would be the neck step and decompression with Dr. Conchita Paris would be the last resort, otherwise return to see me as needed.    ___________________________________________ Ihor Austin. Benjamin Stain, M.D., ABFM., CAQSM. Primary Care and Sports Medicine [facility]  Adjunct Instructor of Family Medicine  [employer]

## 2021-07-19 ENCOUNTER — Other Ambulatory Visit: Payer: Self-pay | Admitting: Family Medicine

## 2021-07-19 DIAGNOSIS — Z794 Long term (current) use of insulin: Secondary | ICD-10-CM

## 2021-07-19 DIAGNOSIS — E119 Type 2 diabetes mellitus without complications: Secondary | ICD-10-CM

## 2021-08-09 ENCOUNTER — Ambulatory Visit: Payer: Medicare Other | Admitting: Family Medicine

## 2021-08-10 ENCOUNTER — Ambulatory Visit: Payer: Medicare Other | Admitting: Family Medicine

## 2021-08-15 ENCOUNTER — Ambulatory Visit (INDEPENDENT_AMBULATORY_CARE_PROVIDER_SITE_OTHER): Payer: Medicare Other | Admitting: Family Medicine

## 2021-08-15 ENCOUNTER — Other Ambulatory Visit: Payer: Self-pay

## 2021-08-15 ENCOUNTER — Encounter: Payer: Self-pay | Admitting: Family Medicine

## 2021-08-15 VITALS — BP 132/81 | HR 92 | Ht 70.0 in | Wt 214.0 lb

## 2021-08-15 DIAGNOSIS — E1165 Type 2 diabetes mellitus with hyperglycemia: Secondary | ICD-10-CM

## 2021-08-15 DIAGNOSIS — M25562 Pain in left knee: Secondary | ICD-10-CM | POA: Insufficient documentation

## 2021-08-15 DIAGNOSIS — Z23 Encounter for immunization: Secondary | ICD-10-CM

## 2021-08-15 DIAGNOSIS — I10 Essential (primary) hypertension: Secondary | ICD-10-CM

## 2021-08-15 DIAGNOSIS — Z794 Long term (current) use of insulin: Secondary | ICD-10-CM | POA: Diagnosis not present

## 2021-08-15 DIAGNOSIS — E782 Mixed hyperlipidemia: Secondary | ICD-10-CM | POA: Diagnosis not present

## 2021-08-15 DIAGNOSIS — L309 Dermatitis, unspecified: Secondary | ICD-10-CM

## 2021-08-15 LAB — POCT GLYCOSYLATED HEMOGLOBIN (HGB A1C): HbA1c, POC (controlled diabetic range): 8 % — AB (ref 0.0–7.0)

## 2021-08-15 MED ORDER — TRIAMCINOLONE ACETONIDE 0.1 % EX CREA: 1.0000 "application " | TOPICAL_CREAM | Freq: Two times a day (BID) | CUTANEOUS | 0 refills | Status: DC

## 2021-08-15 MED ORDER — ATORVASTATIN CALCIUM 80 MG PO TABS: 80.0000 mg | ORAL_TABLET | Freq: Every day | ORAL | 3 refills | Status: DC

## 2021-08-15 NOTE — Patient Instructions (Signed)
Great to see you today Increase lantus to 60 units. Continue current medications.  See me again in 3 months.  We'll update labs at this visit.

## 2021-08-15 NOTE — Assessment & Plan Note (Signed)
Blood pressure is well controlled at this time.  He will continue current medications for management of hypertension.  Low-sodium diet and continue weight loss recommended.

## 2021-08-15 NOTE — Assessment & Plan Note (Signed)
This seems to be improving.  Knee exam is benign.

## 2021-08-15 NOTE — Assessment & Plan Note (Signed)
Blood sugars have improved some.  Continue current medications.  Continue to work on dietary change for management of glucose.

## 2021-08-15 NOTE — Assessment & Plan Note (Signed)
Continues to do well with atorvastatin.  Continue at current strength.

## 2021-08-15 NOTE — Assessment & Plan Note (Signed)
Adding topical triamcinolone to use on elbow as needed.

## 2021-08-15 NOTE — Progress Notes (Signed)
Erik Rose - 65 y.o. male MRN [id number]  Date of birth: 11-Oct-1955  Subjective Chief Complaint  Patient presents with   Hypertension   Diabetes    HPI Erik Rose is a 65 year old male here today for follow-up visit.  He feels like he is doing well at this time.  He did [place]  He still has some mild knee pain but this seems to be improving.  He denies any locking, swelling or weakness of the knee.  He has rash on the back of his left elbow.  He has not tried thing on this area so far.  It is itchy.  Reports blood sugars at home have been between 90 and 120 when fasting.  He continues to do well with current medications.  He denies any symptoms of hypoglycemia.  He has been working on some dietary changes.  Blood pressure remains well controlled with lisinopril, amlodipine and carvedilol.  He denies any anginal symptoms including chest pain or exertional dyspnea.  ROS:  A comprehensive ROS was completed and negative except as noted per HPI   No Known Allergies  Past Medical History:  Diagnosis Date   Diabetes mellitus without complication ([facility])    Diverticulitis    GERD (gastroesophageal reflux disease)    Heart disease    Hypertension    Stroke ([facility])    TIA (transient ischemic attack)     Past Surgical History:  Procedure Laterality Date   CARDIAC CATHETERIZATION     CORONARY ANGIOPLASTY WITH STENT PLACEMENT  2010    Social History   Socioeconomic History   Marital status: Married    Spouse name: [name]   Number of children: 4   Years of education: 14   Highest education level: Associate degree: academic program  Occupational History    Comment: Disability/retired.  Tobacco Use   Smoking status: Some Days    Packs/day: 0.50    Types: Cigarettes   Smokeless tobacco: Never  Vaping Use   Vaping Use: Never used  Substance and Sexual Activity   Alcohol use: Not Currently    Comment: quit 30 yrs ago   Drug  use: Not Currently    Types: Marijuana, Cocaine    Comment: stopped 1991   Sexual activity: Not on file  Other Topics Concern   Not on file  Social History Narrative   Live alone. 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 Likes to [employer] range once a month.    Social Determinants of Health   Financial Resource Strain: Low Risk    Difficulty of Paying Living Expenses: Not hard at all  Food Insecurity: No Food Insecurity   Worried About Programme researcher, broadcasting/film/video in the Last Year: Never true   Ran Out of Food in the Last Year: Never true  Transportation Needs: No Transportation Needs   Lack of Transportation (Medical): No   Lack of Transportation (Non-Medical): No  Physical Activity: Sufficiently Active   Days of Exercise per Week: 7 days   Minutes of Exercise per Session: 120 min  Stress: No Stress Concern Present   Feeling of Stress : Not at all  Social Connections: Moderately Isolated   Frequency of Communication with Friends and Family: More than three times a week   Frequency of Social Gatherings with Friends and Family: More than three times a week   Attends Religious Services: Never   Database administrator or Organizations: No   Attends Banker  Meetings: Never   Marital Status: Married    Family History  Problem Relation Age of Onset   Hypertension Mother    Kidney disease Mother    Miscarriages / [place] Mother    Stroke Mother    Heart disease Father    Depression Sister    Kidney disease Sister    Diabetes Brother    Hypertension Brother    Hyperlipidemia Brother    Kidney disease Brother     Health Maintenance  Topic Date Due   Hepatitis C Screening  Never done   Pneumococcal Vaccine 54-23 Years old (2 - PCV) 12/09/2019   COVID-19 Vaccine (4 - Booster) 11/18/2020   OPHTHALMOLOGY EXAM  06/21/2021   COLONOSCOPY (Pts 45-39yrs Insurance coverage will need to be confirmed)  12/08/2021 (Originally 01/17/2019)   FOOT EXAM  12/06/2021   HEMOGLOBIN A1C  02/12/2022    TETANUS/TDAP  07/19/2030   INFLUENZA VACCINE  Completed   HIV Screening  Completed   Zoster Vaccines- Shingrix  Completed   HPV VACCINES  Aged Out     ----------------------------------------------------------------------------------------------------------------------------------------------------------------------------------------------------------------- Physical Exam BP 132/81 (BP Location: Left Arm, Patient Position: Sitting, Cuff Size: Normal)   Pulse 92   Ht 5\' 10"  (1.778 m)   Wt 214 lb (97.1 kg)   SpO2 100%   BMI 30.71 kg/m   Physical Exam Constitutional:      Appearance: Normal appearance.  HENT:     Head: Normocephalic and atraumatic.  Eyes:     General: No scleral icterus. Cardiovascular:     Rate and Rhythm: Normal rate and regular rhythm.  Pulmonary:     Effort: Pulmonary effort is normal.     Breath sounds: Normal breath sounds.  Musculoskeletal:     Cervical back: Neck supple.     Comments: Is no effusion noted.  Mild tenderness along the lateral joint line.  Range of motion is normal.  No ligament laxity including negative Lachman test.  Negative meniscal provocation testing.  Neurological:     General: No focal deficit present.     Mental Status: He is alert.  Psychiatric:        Mood and Affect: Mood normal.        Behavior: Behavior normal.    ------------------------------------------------------------------------------------------------------------------------------------------------------------------------------------------------------------------- Assessment and Plan  Essential hypertension Blood pressure is well controlled at this time.  He will continue current medications for management of hypertension.  Low-sodium diet and continue weight loss recommended.  Type 2 diabetes mellitus with hyperglycemia, with long-term current use of insulin ([facility]) Blood sugars have improved some.  Continue current medications.  Continue to work on dietary  change for management of glucose.  Mixed hyperlipidemia Continues to do well with atorvastatin.  Continue at current strength.  Eczema Adding topical triamcinolone to use on elbow as needed.  Left knee pain This seems to be improving.  Knee exam is benign.   Meds ordered this encounter  Medications   atorvastatin (LIPITOR) 80 MG tablet    Sig: Take 1 tablet (80 mg total) by mouth daily.    Dispense:  90 tablet    Refill:  3   triamcinolone cream (KENALOG) 0.1 %    Sig: Apply 1 application topically 2 (two) times daily.    Dispense:  30 g    Refill:  0    Return in about 3 months (around 11/15/2021) for HTN/T2DM.    This visit occurred during the SARS-CoV-2 public health emergency.  Safety protocols were in place, including screening questions prior  to the visit, additional usage of staff PPE, and extensive cleaning of exam room while observing appropriate contact time as indicated for disinfecting solutions.

## 2021-08-16 ENCOUNTER — Ambulatory Visit (INDEPENDENT_AMBULATORY_CARE_PROVIDER_SITE_OTHER): Payer: Medicare Other | Admitting: Pharmacist

## 2021-08-16 DIAGNOSIS — E782 Mixed hyperlipidemia: Secondary | ICD-10-CM

## 2021-08-16 DIAGNOSIS — I10 Essential (primary) hypertension: Secondary | ICD-10-CM

## 2021-08-16 DIAGNOSIS — E1165 Type 2 diabetes mellitus with hyperglycemia: Secondary | ICD-10-CM

## 2021-08-16 NOTE — Progress Notes (Signed)
Chronic Care Management Pharmacy Note  08/16/2021 Name:  Takeo Harts MRN:  [id number] DOB:  [birth year]  Summary: addressed DM, HTN, HLD. Patient uses patient assistance programs for both lantus and lispro & is a great self-advocate of paperwork & ongoing needs. Doing well, saw PCP yesterday, a1c downtrend. Lantus increased from 55 to 60 units daily. Due for renewing patient assistance for 2023.   Recommendations/Changes made from today's visit: none, will coordinate with care team for patient assistance paperwork progress & assist as needed  Plan: f/u with pharmacist in 1 month  Subjective: Remiel Corti is an 65 y.o. year old male who is a primary patient of [name], DO.  The CCM team was consulted for assistance with disease management and care coordination needs.    Engaged with patient by telephone for follow up visit in response to provider referral for pharmacy case management and/or care coordination services.   Consent to Services:  The patient was given information about Chronic Care Management services, agreed to services, and gave verbal consent prior to initiation of services.  Please see initial visit note for detailed documentation.   Patient Care Team: [name], DO as PCP - General (Family Medicine) Freada Bergeron, MD as PCP - Cardiology (Cardiology) [name], [facility] as Pharmacist (Pharmacist)  Recent office visits:  05/30/21-Thomas J. Dianah Field, MD. Lorenda Cahill for lumbar spinal stenosis. Start prednisone 50 mg for 5 days. Increase gabapentin 300 mg. Follow up in 4-6 weeks. 05/09/21-Cody Zigmund Daniel, DO (PCP) Seen for general follow up.  Increase Lantus to 35 units twice daily.  Continue Humalog 15 units 3 times daily.  Adding metformin XR back on at 1000 mg daily. Follow up in 3 months. 03/15/21-Cody Zigmund Daniel, DO (PCP) Seen for hip pain. Start prednisone taper and tizanidine as needed. 02/05/21-Cody Zigmund Daniel, DO (PCP) General follow up.  Start on amlodipine 5 mg. Increase lantus to 60 units split to 30 units BID. New neurosurgery referral entered. Follow up in 3 months.   Recent consult visits:  06/20/21-Karen K. Donawerth, PT (Rehabilitation) 06/15/21-Karen K. Donawerth, PT (Rehabilitation) 06/13/21-Jennifer L. [place] (Rehabilitation) 06/06/21-Karen K. Donawerth, PT (Rehabilitation) 06/01/21-Christina M. Kathlen Mody, PT (Rehabilitation) 03/12/21-Heather Renae Fickle, MD (Cardiology) Seen for general follow up. Stopped ozempic due to pancreatitis. Follow up in 8 months. 02/28/21-[facility]. Notes not available. 02/14/21-Neelesh Nundkumar (Neurosurgery) Notes not available.   Hospital visits:  None in previous 6 months  Objective:  Lab Results  Component Value Date   CREATININE 0.77 11/18/2020   CREATININE 0.93 01/18/2020   CREATININE 0.99 05/12/2019    Lab Results  Component Value Date   HGBA1C 8.0 (A) 08/15/2021      Component Value Date/Time   CHOL 138 09/14/2020 1133   TRIG 63 09/14/2020 1133   HDL 53 09/14/2020 1133   CHOLHDL 2.6 09/14/2020 1133   CHOLHDL 3.3 01/18/2020 1051   LDLCALC 72 09/14/2020 1133   LDLCALC 74 01/18/2020 1051   LDLDIRECT 112.0 09/09/2018 1513    Hepatic Function Latest Ref Rng & Units 11/18/2020 01/18/2020 09/09/2018  Total Protein 6.5 - 8.1 g/dL 7.4 7.2 7.4  Albumin 3.5 - 5.0 g/dL 3.7 - 4.3  AST 15 - 41 U/L _0 ALT 0 - 44 U/L _1 Alk Phosphatase 38 - 126 U/L 92 - 98  Total Bilirubin 0.3 - 1.2 mg/dL 0.5 0.6 0.6    CBC Latest Ref Rng & Units 11/18/2020 01/18/2020 09/09/2018  WBC 4.0 - 10.5 K/uL 10.4 8.6 8.0  Hemoglobin 13.0 - 17.0 g/dL 12.8(L) 14.8 12.9(L)  Hematocrit 39.0 - 52.0 % 38.1(L) 44.1 40.1  Platelets 150 - 400 K/uL 226 191 252.0    No results found for: VD25OH  Clinical ASCVD: Yes  The 10-year ASCVD risk score (Arnett DK, et al., 2019) is: 27.1%   Values used to calculate the score:     Age: 89 years     Sex: Male     Is  Non-Hispanic African American: No     Diabetic: Yes     Tobacco smoker: Yes     Systolic Blood Pressure: 481 mmHg     Is BP treated: Yes     HDL Cholesterol: 53 mg/dL     Total Cholesterol: 138 mg/dL    Social History   Tobacco Use  Smoking Status Some Days   Packs/day: 0.50   Types: Cigarettes  Smokeless Tobacco Never   BP Readings from Last 3 Encounters:  08/15/21 132/81  06/23/21 (!) 148/79  05/09/21 127/75   Pulse Readings from Last 3 Encounters:  08/15/21 92  06/23/21 93  05/09/21 81   Wt Readings from Last 3 Encounters:  08/15/21 214 lb (97.1 kg)  05/09/21 220 lb (99.8 kg)  03/15/21 213 lb (96.6 kg)    Assessment: Review of patient past medical history, allergies, medications, health status, including review of consultants reports, laboratory and other test data, was performed as part of comprehensive evaluation and provision of chronic care management services.   SDOH:  (Social Determinants of Health) assessments and interventions performed:    CCM Care Plan  No Known Allergies  Medications Reviewed Today     Reviewed by [name], [facility] (Pharmacist) on 08/16/21 at 1115  Med List Status: <None>   Medication Order Taking? Sig Documenting Provider Last Dose Status Informant  amLODipine (NORVASC) 5 MG tablet [id number] Yes Take 1 tablet (5 mg total) by mouth daily. [name], DO Taking Active   aspirin EC 81 MG tablet [id number] Yes Take 1 tablet (81 mg total) by mouth daily. Swallow whole. Freada Bergeron, MD Taking Active   atorvastatin (LIPITOR) 80 MG tablet [id number] Yes Take 1 tablet (80 mg total) by mouth daily. [name], DO Taking Active   Blood Glucose Monitoring Suppl (ONE TOUCH ULTRA 2) w/Device KIT [id number] Yes Use to take blood sugar one to two times daily [name], DO Taking Active   carvedilol (COREG) 12.5 MG tablet [id number] Yes TAKE 1 TABLET(12.5 MG) BY MOUTH TWICE DAILY WITH A MEAL [name], DO Taking Active    Esomeprazole Magnesium (NEXIUM PO) [id number] Yes Take by mouth. [provider] Taking Active   gabapentin (NEURONTIN) 300 MG capsule [id number] Yes One tab PO qHS for a week, then BID for a week, then TID. May double weekly to a max of 3,671m/day TSilverio Decamp MD Taking Active            Med Note ([name]  Tue Jun 26, 2021 11:20 AM) Taking PRN  insulin glargine (LANTUS SOLOSTAR) 100 UNIT/ML Solostar Pen [id number] ADMINISTER 55 UNITS UNDER THE SKIN DAILY  Patient taking differently: Inject 60 Units into the skin daily. ADMINISTER 60 UNITS UNDER THE SKIN DAILY (as of 08/15/21)   MLuetta Nutting DO Taking Active   insulin lispro (HUMALOG KWIKPEN) 100 UNIT/ML KwikPen [id number] INJECT 15 UNITS INTO THE SKIN 3 TIMES DAILY MLuetta Nutting DO Taking Active   lisinopril (ZESTRIL) 40 MG tablet [id number] Take 1 tablet (40  mg total) by mouth daily. Freada Bergeron, MD Taking Active   metFORMIN (GLUCOPHAGE XR) 500 MG 24 hr tablet [id number] Yes Take 2 tablets (1,000 mg total) by mouth daily with breakfast. [name], DO Taking Active   [facility] ULTRA test strip [id number] Yes USE TO CHECK BLOOD SUGAR TWICE DAILY [name], DO Taking Active   OVER THE COUNTER MEDICATION [id number] Yes Take 1 tablet by mouth daily. ALLERGY PILL [provider] Taking Active   triamcinolone cream (KENALOG) 0.1 % [id number] Yes Apply 1 application topically 2 (two) times daily. [name], DO Taking Active             Patient Active Problem List   Diagnosis Date Noted   Left knee pain 08/15/2021   Lumbar spinal stenosis 03/15/2021   Pancreatitis 12/10/2020   Cervical radiculopathy 04/18/2020   Smoking greater than 20 pack years 04/18/2020   Eczema 01/18/2020   Colon cancer screening 12/09/2018   Essential hypertension 09/09/2018   Type 2 diabetes mellitus with hyperglycemia, with long-term current use of insulin ([place]) 09/09/2018   CAD (coronary artery  disease), native coronary artery 09/09/2018   History of CVA (cerebrovascular accident) 09/09/2018   Mixed hyperlipidemia 02/05/2018    Immunization History  Administered Date(s) Administered   Influenza, Quadrivalent, Recombinant, Inj, Pf 07/17/2018   Influenza,inj,Quad PF,6+ Mos 06/10/2017, 06/22/2019, 07/19/2020, 08/15/2021   Influenza-Unspecified 06/10/2017   PFIZER(Purple Top)SARS-COV-2 Vaccination 12/23/2019, 01/06/2020, 09/23/2020   Pneumococcal Polysaccharide-23 12/09/2018   Tdap 07/19/2020   Zoster Recombinat (Shingrix) 06/10/2017, 05/09/2021    Conditions to be addressed/monitored: HTN, HLD, and DMII  Care Plan : Medication Management  Updates made by [name], [place] since 08/16/2021 12:00 AM     Problem: DM, HTN, HLD      Long-Range Goal: Disease Progression Prevention   Start Date: 06/26/2021  Recent Progress: On track  Priority: High  Note:   Current Barriers:  None at present Cost: patient uses patient assistance programs for lantus & humalog  Pharmacist Clinical Goal(s):  Over the next 30 days, patient will adhere to plan to optimize therapeutic regimen for chronic conditions as evidenced by report of adherence to recommended medication management changes through collaboration with PharmD and provider.   Interventions: 1:1 collaboration with [name], DO regarding development and update of comprehensive plan of care as evidenced by provider attestation and co-signature Inter-disciplinary care team collaboration (see longitudinal plan of care) Comprehensive medication review performed; medication list updated in electronic medical record  Diabetes:  Uncontrolled; current treatment:lantus 60 units (increased at 08/15/21 PCP visit), humalog 15 units TID, metformin XR 102m daily ; a1c 11.2, down to 8   Current glucose readings: fasting glucose: 90-110, post prandial glucose: 120-160  Reports hypoglycemic symptoms when <100  Current meal patterns:  to be discussed at future visits  Current exercise: stays busy flipping houses  Counseled on BG goal values,  Recommended continue current regimen,  Hypertension:  Controlled; current treatment: amlodipine 521mdaily, carvedilol 12.69m39mID, lisinopril 43m33mily;   Current home readings: 120-130/70s  Denies hypotensive/hypertensive symptoms  Counseled on BP goals Recommended continue current regimen, Hyperlipidemia:  Controlled; current treatment:atorvastatin 80mg33mly; LDL 72  Recommended continue current regimen  Patient Goals/Self-Care Activities Over the next 30 days, patient will:  take medications as prescribed  Follow Up Plan: Telephone follow up appointment with care management team member scheduled for:  1 month       Medication Assistance:  Patient uses programs for both lantus and humalog, takes  self-responsibility for paperwork & renewals.  Patient's preferred pharmacy is:  [id number] [address], [address] [place] [address] [place] [address] Phone: [phone] Fax: [phone]  Uses pill box? Yes Pt endorses 100% compliance  Follow Up:  Patient agrees to Care Plan and Follow-up.  Plan: Telephone follow up appointment with care management team member scheduled for:  1 month  Larinda Buttery, PharmD Clinical Pharmacist [facility] [phone]

## 2021-08-16 NOTE — Patient Instructions (Signed)
Visit Information  Patient Goals/Self-Care Activities Over the next 30 days, patient will:  take medications as prescribed  Follow Up Plan: Telephone follow up appointment with care management team member scheduled for: 1 month  Patient verbalizes understanding of instructions provided today and agrees to view in MyChart.   Telephone follow up appointment with care management team member scheduled for: 1 month  Erik Rose J Christna Kulick  

## 2021-08-17 NOTE — Progress Notes (Signed)
Patient stated he would do this on Monday, paperwork placed in acordion until patient comes. AM

## 2021-08-27 LAB — HM DIABETES EYE EXAM

## 2021-09-03 ENCOUNTER — Other Ambulatory Visit: Payer: Self-pay

## 2021-09-03 MED ORDER — INSULIN LISPRO (1 UNIT DIAL) 100 UNIT/ML (KWIKPEN): PEN_INJECTOR | SUBCUTANEOUS | 3 refills | Status: DC

## 2021-09-05 DIAGNOSIS — I10 Essential (primary) hypertension: Secondary | ICD-10-CM

## 2021-09-05 DIAGNOSIS — Z794 Long term (current) use of insulin: Secondary | ICD-10-CM

## 2021-09-05 DIAGNOSIS — E1165 Type 2 diabetes mellitus with hyperglycemia: Secondary | ICD-10-CM

## 2021-09-05 DIAGNOSIS — E782 Mixed hyperlipidemia: Secondary | ICD-10-CM

## 2021-09-12 ENCOUNTER — Other Ambulatory Visit: Payer: Self-pay

## 2021-09-12 ENCOUNTER — Encounter: Payer: Self-pay | Admitting: Family Medicine

## 2021-09-12 ENCOUNTER — Ambulatory Visit (INDEPENDENT_AMBULATORY_CARE_PROVIDER_SITE_OTHER): Payer: Medicare Other

## 2021-09-12 ENCOUNTER — Ambulatory Visit (INDEPENDENT_AMBULATORY_CARE_PROVIDER_SITE_OTHER): Payer: Medicare Other | Admitting: Family Medicine

## 2021-09-12 VITALS — BP 152/97 | HR 91 | Temp 98.1°F | Ht 70.0 in | Wt 212.0 lb

## 2021-09-12 DIAGNOSIS — M25562 Pain in left knee: Secondary | ICD-10-CM

## 2021-09-12 DIAGNOSIS — W5529XA Other contact with cow, initial encounter: Secondary | ICD-10-CM

## 2021-09-12 MED ORDER — MELOXICAM 15 MG PO TABS: ORAL_TABLET | ORAL | 0 refills | Status: DC
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
## 2021-09-13 ENCOUNTER — Other Ambulatory Visit: Payer: Self-pay | Admitting: Cardiology

## 2021-09-13 ENCOUNTER — Other Ambulatory Visit: Payer: Self-pay

## 2021-09-13 DIAGNOSIS — E782 Mixed hyperlipidemia: Secondary | ICD-10-CM

## 2021-09-13 MED ORDER — MELOXICAM 15 MG PO TABS: ORAL_TABLET | ORAL | 0 refills | Status: DC

## 2021-09-14 ENCOUNTER — Other Ambulatory Visit: Payer: Self-pay

## 2021-09-14 MED ORDER — LISINOPRIL 40 MG PO TABS: 40.0000 mg | ORAL_TABLET | Freq: Every day | ORAL | 1 refills | Status: DC

## 2021-09-17 ENCOUNTER — Other Ambulatory Visit: Payer: Self-pay

## 2021-09-17 ENCOUNTER — Ambulatory Visit: Payer: Medicare Other | Admitting: Pharmacist

## 2021-09-17 DIAGNOSIS — E782 Mixed hyperlipidemia: Secondary | ICD-10-CM

## 2021-09-17 DIAGNOSIS — E1165 Type 2 diabetes mellitus with hyperglycemia: Secondary | ICD-10-CM

## 2021-09-17 DIAGNOSIS — I10 Essential (primary) hypertension: Secondary | ICD-10-CM

## 2021-09-17 NOTE — Progress Notes (Signed)
Chronic Care Management Pharmacy Note  09/17/2021 Name:  Erik Rose MRN:  [id number] DOB:  1956/03/07  Summary: addressed DM, HTN, HLD. Patient uses patient assistance programs for both lantus and lispro & is a great self-advocate of paperwork & ongoing needs. They are approved for 2023. Patient was at an eye doctor appointment so our call was brief.  Recommendations/Changes made from today's visit: none   Plan: f/u with pharmacist in 6-8 months  Subjective: Erik Rose is an 65 y.o. year old male who is a primary patient of [name], DO.  The CCM team was consulted for assistance with disease management and care coordination needs.    Engaged with patient by telephone for follow up visit in response to provider referral for pharmacy case management and/or care coordination services.   Consent to Services:  The patient was given information about Chronic Care Management services, agreed to services, and gave verbal consent prior to initiation of services.  Please see initial visit note for detailed documentation.   Patient Care Team: [name], DO as PCP - General (Family Medicine) Freada Bergeron, MD as PCP - Cardiology (Cardiology) [name], [facility] as Pharmacist (Pharmacist)  Recent office visits:  05/30/21-Thomas J. Dianah Field, MD. Lorenda Cahill for lumbar spinal stenosis. Start prednisone 50 mg for 5 days. Increase gabapentin 300 mg. Follow up in 4-6 weeks. 05/09/21-Cody Zigmund Daniel, DO (PCP) Seen for general follow up.  Increase Lantus to 35 units twice daily.  Continue Humalog 15 units 3 times daily.  Adding metformin XR back on at 1000 mg daily. Follow up in 3 months. 03/15/21-Cody Zigmund Daniel, DO (PCP) Seen for hip pain. Start prednisone taper and tizanidine as needed. 02/05/21-Cody Zigmund Daniel, DO (PCP) General follow up. Start on amlodipine 5 mg. Increase lantus to 60 units split to 30 units BID. New neurosurgery referral entered. Follow up in 3 months.    Recent consult visits:  06/20/21-Karen K. Donawerth, PT (Rehabilitation) 06/15/21-Karen K. Donawerth, PT (Rehabilitation) 06/13/21-Jennifer L. Patterson (Rehabilitation) 06/06/21-Karen K. Donawerth, PT (Rehabilitation) 06/01/21-Christina M. Kathlen Mody, PT (Rehabilitation) 03/12/21-Heather Renae Fickle, MD (Cardiology) Seen for general follow up. Stopped ozempic due to pancreatitis. Follow up in 8 months. 02/28/21-[facility]. Notes not available. 02/14/21-Neelesh Nundkumar (Neurosurgery) Notes not available.   Hospital visits:  None in previous 6 months  Objective:  Lab Results  Component Value Date   CREATININE 0.77 11/18/2020   CREATININE 0.93 01/18/2020   CREATININE 0.99 05/12/2019    Lab Results  Component Value Date   HGBA1C 8.0 (A) 08/15/2021      Component Value Date/Time   CHOL 138 09/14/2020 1133   TRIG 63 09/14/2020 1133   HDL 53 09/14/2020 1133   CHOLHDL 2.6 09/14/2020 1133   CHOLHDL 3.3 01/18/2020 1051   LDLCALC 72 09/14/2020 1133   LDLCALC 74 01/18/2020 1051   LDLDIRECT 112.0 09/09/2018 1513    Hepatic Function Latest Ref Rng & Units 11/18/2020 01/18/2020 09/09/2018  Total Protein 6.5 - 8.1 g/dL 7.4 7.2 7.4  Albumin 3.5 - 5.0 g/dL 3.7 - 4.3  AST 15 - 41 U/L _0 ALT 0 - 44 U/L _1 Alk Phosphatase 38 - 126 U/L 92 - 98  Total Bilirubin 0.3 - 1.2 mg/dL 0.5 0.6 0.6    CBC Latest Ref Rng & Units 11/18/2020 01/18/2020 09/09/2018  WBC 4.0 - 10.5 K/uL 10.4 8.6 8.0  Hemoglobin 13.0 - 17.0 g/dL 12.8(L) 14.8 12.9(L)  Hematocrit 39.0 - 52.0 % 38.1(L) 44.1 40.1  Platelets  150 - 400 K/uL 226 191 252.0    No results found for: VD25OH  Clinical ASCVD: Yes  The 10-year ASCVD risk score (Arnett DK, et al., 2019) is: 33.4%   Values used to calculate the score:     Age: 46 years     Sex: Male     Is Non-Hispanic African American: No     Diabetic: Yes     Tobacco smoker: Yes     Systolic Blood Pressure: 625 mmHg     Is BP treated:  Yes     HDL Cholesterol: 53 mg/dL     Total Cholesterol: 138 mg/dL    Social History   Tobacco Use  Smoking Status Some Days   Packs/day: 0.50   Types: Cigarettes  Smokeless Tobacco Never   BP Readings from Last 3 Encounters:  09/12/21 (!) 152/97  08/15/21 132/81  06/23/21 (!) 148/79   Pulse Readings from Last 3 Encounters:  09/12/21 91  08/15/21 92  06/23/21 93   Wt Readings from Last 3 Encounters:  09/12/21 212 lb (96.2 kg)  08/15/21 214 lb (97.1 kg)  05/09/21 220 lb (99.8 kg)    Assessment: Review of patient past medical history, allergies, medications, health status, including review of consultants reports, laboratory and other test data, was performed as part of comprehensive evaluation and provision of chronic care management services.   SDOH:  (Social Determinants of Health) assessments and interventions performed:    CCM Care Plan  No Known Allergies  Medications Reviewed Today     Reviewed by Leontine Locket, Eustace (Certified Medical Assistant) on 09/12/21 at 1023  Med List Status: <None>   Medication Order Taking? Sig Documenting Provider Last Dose Status Informant  amLODipine (NORVASC) 5 MG tablet [id number] Yes Take 1 tablet (5 mg total) by mouth daily. [name], DO Taking Active   aspirin EC 81 MG tablet [id number] Yes Take 1 tablet (81 mg total) by mouth daily. Swallow whole. Freada Bergeron, MD Taking Active   atorvastatin (LIPITOR) 80 MG tablet [id number] Yes Take 1 tablet (80 mg total) by mouth daily. [name], DO Taking Active   Blood Glucose Monitoring Suppl (ONE TOUCH ULTRA 2) w/Device KIT [id number] Yes Use to take blood sugar one to two times daily [name], DO Taking Active   carvedilol (COREG) 12.5 MG tablet [id number] Yes TAKE 1 TABLET(12.5 MG) BY MOUTH TWICE DAILY WITH A MEAL [name], DO Taking Active   Esomeprazole Magnesium (NEXIUM PO) [id number] Yes Take by mouth. [provider] Taking Active   gabapentin  (NEURONTIN) 300 MG capsule [id number] Yes One tab PO qHS for a week, then BID for a week, then TID. May double weekly to a max of 3,654m/day TSilverio Decamp MD Taking Active            Med Note ([name]  Tue Jun 26, 2021 11:20 AM) Taking PRN  insulin glargine (LANTUS SOLOSTAR) 100 UNIT/ML Solostar Pen [id number] ADMINISTER 55 UNITS UNDER THE SKIN DAILY  Patient taking differently: Inject 60 Units into the skin daily. ADMINISTER 60 UNITS UNDER THE SKIN DAILY (as of 08/15/21)   MLuetta Nutting DO Taking Active   insulin lispro (HUMALOG KWIKPEN) 100 UNIT/ML KwikPen [id number] INJECT 15 UNITS INTO THE SKIN 3 TIMES DAILY MLuetta Nutting DO Taking Active   lisinopril (ZESTRIL) 40 MG tablet [id number] Take 1 tablet (40 mg total) by mouth daily. PFreada Bergeron MD Taking Active   metFORMIN (GLUCOPHAGE XR) 500  MG 24 hr tablet [id number] Yes Take 2 tablets (1,000 mg total) by mouth daily with breakfast. [name], DO Taking Active   [facility] ULTRA test strip [id number] Yes USE TO CHECK BLOOD SUGAR TWICE DAILY [name], DO Taking Active   OVER THE COUNTER MEDICATION [id number] Yes Take 1 tablet by mouth daily. ALLERGY PILL [provider] Taking Active   triamcinolone cream (KENALOG) 0.1 % [id number] Yes Apply 1 application topically 2 (two) times daily. [name], DO Taking Active             Patient Active Problem List   Diagnosis Date Noted   Left knee pain 08/15/2021   Lumbar spinal stenosis 03/15/2021   Pancreatitis 12/10/2020   Cervical radiculopathy 04/18/2020   Smoking greater than 20 pack years 04/18/2020   Eczema 01/18/2020   Colon cancer screening 12/09/2018   Essential hypertension 09/09/2018   Type 2 diabetes mellitus with hyperglycemia, with long-term current use of insulin ([place]) 09/09/2018   CAD (coronary artery disease), native coronary artery 09/09/2018   History of CVA (cerebrovascular accident) 09/09/2018   Mixed  hyperlipidemia 02/05/2018    Immunization History  Administered Date(s) Administered   Influenza, Quadrivalent, Recombinant, Inj, Pf 07/17/2018   Influenza,inj,Quad PF,6+ Mos 06/10/2017, 06/22/2019, 07/19/2020, 08/15/2021   Influenza-Unspecified 06/10/2017   PFIZER(Purple Top)SARS-COV-2 Vaccination 12/23/2019, 01/06/2020, 09/23/2020   Pneumococcal Polysaccharide-23 12/09/2018   Tdap 07/19/2020   Zoster Recombinat (Shingrix) 06/10/2017, 05/09/2021    Conditions to be addressed/monitored: HTN, HLD, and DMII  There are no care plans that you recently modified to display for this patient.    Medication Assistance:  Patient uses programs for both lantus and humalog, takes self-responsibility for paperwork & renewals.  Patient's preferred pharmacy is:  [id number] [address] [place] [address] Phone: [phone] Fax: [phone]  Uses pill box? Yes Pt endorses 100% compliance  Follow Up:  Patient agrees to Care Plan and Follow-up.  Plan: Telephone follow up appointment with care management team member scheduled for:  6-8 months  Larinda Buttery, PharmD Clinical Pharmacist [facility] [phone]

## 2021-09-17 NOTE — Patient Instructions (Signed)
Visit Information ° °Thank you for taking time to visit with me today. Please don't hesitate to contact me if I can be of assistance to you before our next scheduled telephone appointment. ° °Following are the goals we discussed today:  ° °Patient Goals/Self-Care Activities °Over the next 180 days, patient will:  °take medications as prescribed ° °Follow Up Plan: Telephone follow up appointment with care management team member scheduled for: 6-8 months ° °Please call the care guide team at [phone] if you need to cancel or reschedule your appointment.  ° ° °Patient verbalizes understanding of instructions provided today and agrees to view in MyChart.  ° °Erik Rose J Erik Rose ° °

## 2021-10-03 ENCOUNTER — Other Ambulatory Visit: Payer: Self-pay

## 2021-10-03 ENCOUNTER — Encounter: Payer: Self-pay | Admitting: Family Medicine

## 2021-10-03 ENCOUNTER — Telehealth (INDEPENDENT_AMBULATORY_CARE_PROVIDER_SITE_OTHER): Payer: Medicare Other | Admitting: Family Medicine

## 2021-10-03 DIAGNOSIS — U071 COVID-19: Secondary | ICD-10-CM | POA: Insufficient documentation

## 2021-10-03 MED ORDER — NIRMATRELVIR/RITONAVIR (PAXLOVID)TABLET: 3.0000 | ORAL_TABLET | Freq: Two times a day (BID) | ORAL | 0 refills | Status: AC

## 2021-10-03 NOTE — Assessment & Plan Note (Signed)
Discussed adding Paxlovid due to his multiple comorbidities and increased risk for complications.  He will hold atorvastatin while taking this.  Recommend continued supportive care with increased fluids and over-the-counter antipyretics and expectorants.  Instructed to contact clinic if symptoms are worsening.

## 2021-10-03 NOTE — Progress Notes (Signed)
At-home test today x 2 =COVID + Sx's started 10/02/21 Fever, runny nose, headache

## 2021-10-03 NOTE — Progress Notes (Signed)
Erik Rose - 65 y.o. male MRN [id number]  Date of birth: Jan 20, 1956   This visit type was conducted due to national recommendations for restrictions regarding the COVID-19 Pandemic (e.g. social distancing).  This format is felt to be most appropriate for this patient at this time.  All issues noted in this document were discussed and addressed.  No physical exam was performed (except for noted visual exam findings with Video Visits).  I discussed the limitations of evaluation and management by telemedicine and the availability of in person appointments. The patient expressed understanding and agreed to proceed.  I connected withNAME@ on 10/03/21 at  8:50 AM EST by a video enabled telemedicine application and verified that I am speaking with the correct person using two identifiers.  Present at visit: [name], DO [name]   Patient Location: Home [address] [place] [address]   Provider location:   [facility]  Chief Complaint  Patient presents with   Covid Positive    HPI  Erik Rose is a 65 y.o. male who presents via audio/video conferencing for a telehealth visit today.  Reports testing positive for COVID today.  Symptoms started yesterday.  Current symptoms include mild cough, sore throat, congestion, fever and headache.  He denies any difficulty breathing, wheezing or dyspnea.  He has not had chest pain.  He is taking over-the-counter Coricidin with some improvement.   ROS:  A comprehensive ROS was completed and negative except as noted per HPI  Past Medical History:  Diagnosis Date   Diabetes mellitus without complication (HCC)    Diverticulitis    GERD (gastroesophageal reflux disease)    Heart disease    Hypertension    Stroke ([place])    TIA (transient ischemic attack)     Past Surgical History:  Procedure Laterality Date   CARDIAC CATHETERIZATION     CORONARY ANGIOPLASTY WITH STENT PLACEMENT  2010    Family History   Problem Relation Age of Onset   Hypertension Mother    Kidney disease Mother    Miscarriages / [place] Mother    Stroke Mother    Heart disease Father    Depression Sister    Kidney disease Sister    Diabetes Brother    Hypertension Brother    Hyperlipidemia Brother    Kidney disease Brother     Social History   Socioeconomic History   Marital status: Married    Spouse name: [name]   Number of children: 4   Years of education: 14   Highest education level: Associate degree: academic program  Occupational History    Comment: Disability/retired.  Tobacco Use   Smoking status: Some Days    Packs/day: 0.50    Types: Cigarettes   Smokeless tobacco: Never  Vaping Use   Vaping Use: Never used  Substance and Sexual Activity   Alcohol use: Not Currently    Comment: quit 30 yrs ago   Drug use: Not Currently    Types: Marijuana, Cocaine    Comment: stopped 1991   Sexual activity: Not on file  Other Topics Concern   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    Social Determinants of Health   Financial Resource Strain: Low Risk    Difficulty of Paying Living Expenses: Not hard at all  Food Insecurity: No Food Insecurity   Worried About Charity fundraiser in  the Last Year: Never true   Ran Out of Food in the Last Year: Never true  Transportation Needs: No Transportation Needs   Lack of Transportation (Medical): No   Lack of Transportation (Non-Medical): No  Physical Activity: Sufficiently Active   Days of Exercise per Week: 7 days   Minutes of Exercise per Session: 120 min  Stress: No Stress Concern Present   Feeling of Stress : Not at all  Social Connections: Moderately Isolated   Frequency of Communication with Friends and Family: More than three times a week   Frequency of Social Gatherings with Friends and Family: More than three times a week   Attends Religious Services: Never    Marine scientist or Organizations: No   Attends Music therapist: Never   Marital Status: Married  Human resources officer Violence: Not At Risk   Fear of Current or Ex-Partner: No   Emotionally Abused: No   Physically Abused: No   Sexually Abused: No     Current Outpatient Medications:    amLODipine (NORVASC) 5 MG tablet, Take 1 tablet (5 mg total) by mouth daily., Disp: 90 tablet, Rfl: 3   aspirin EC 81 MG tablet, Take 1 tablet (81 mg total) by mouth daily. Swallow whole., Disp: , Rfl:    atorvastatin (LIPITOR) 80 MG tablet, Take 1 tablet (80 mg total) by mouth daily., Disp: 90 tablet, Rfl: 3   Blood Glucose Monitoring Suppl (ONE TOUCH ULTRA 2) w/Device KIT, Use to take blood sugar one to two times daily, Disp: 1 each, Rfl: 0   carvedilol (COREG) 12.5 MG tablet, TAKE 1 TABLET(12.5 MG) BY MOUTH TWICE DAILY WITH A MEAL, Disp: 180 tablet, Rfl: 1   Esomeprazole Magnesium (NEXIUM PO), Take by mouth., Disp: , Rfl:    gabapentin (NEURONTIN) 300 MG capsule, One tab PO qHS for a week, then BID for a week, then TID. May double weekly to a max of 3,644m/day, Disp: 90 capsule, Rfl: 3   insulin glargine (LANTUS SOLOSTAR) 100 UNIT/ML Solostar Pen, ADMINISTER 55 UNITS UNDER THE SKIN DAILY (Patient taking differently: Inject 60 Units into the skin daily. ADMINISTER 60 UNITS UNDER THE SKIN DAILY (as of 08/15/21)), Disp: 51 mL, Rfl: 2   insulin lispro (HUMALOG KWIKPEN) 100 UNIT/ML KwikPen, INJECT 15 UNITS INTO THE SKIN 3 TIMES DAILY, Disp: 15 mL, Rfl: 3   lisinopril (ZESTRIL) 40 MG tablet, Take 1 tablet (40 mg total) by mouth daily., Disp: 90 tablet, Rfl: 1   meloxicam (MOBIC) 15 MG tablet, Take daily for 10 days and then as needed., Disp: 90 tablet, Rfl: 0   metFORMIN (GLUCOPHAGE XR) 500 MG 24 hr tablet, Take 2 tablets (1,000 mg total) by mouth daily with breakfast., Disp: 180 tablet, Rfl: 1   nirmatrelvir/ritonavir EUA (PAXLOVID) 20 x 150 MG & 10 x 100MG TABS, Take 3 tablets by mouth 2 (two)  times daily for 5 days. (Take nirmatrelvir 150 mg two tablets twice daily for 5 days and ritonavir 100 mg one tablet twice daily for 5 days) Patient GFR is >60.  Hold atorvastatin while taking., Disp: 30 tablet, Rfl: 0   ONETOUCH ULTRA test strip, USE TO CHECK BLOOD SUGAR TWICE DAILY, Disp: 100 strip, Rfl: 1   OVER THE COUNTER MEDICATION, Take 1 tablet by mouth daily. ALLERGY PILL, Disp: , Rfl:    triamcinolone cream (KENALOG) 0.1 %, Apply 1 application topically 2 (two) times daily., Disp: 30 g, Rfl: 0  EXAM:  VITALS per patient if applicable: Temp  99.3 F (37.4 C)    Ht 5' 10" (1.778 m)    Wt 212 lb (96.2 kg)    BMI 30.42 kg/m   GENERAL: alert, oriented, appears well and in no acute distress  HEENT: atraumatic, conjunttiva clear, no obvious abnormalities on inspection of external nose and ears  NECK: normal movements of the head and neck  LUNGS: on inspection no signs of respiratory distress, breathing rate appears normal, no obvious gross SOB, gasping or wheezing  CV: no obvious cyanosis  MS: moves all visible extremities without noticeable abnormality  PSYCH/NEURO: pleasant and cooperative, no obvious depression or anxiety, speech and thought processing grossly intact  ASSESSMENT AND PLAN:  Discussed the following assessment and plan:  COVID-19 Discussed adding Paxlovid due to his multiple comorbidities and increased risk for complications.  He will hold atorvastatin while taking this.  Recommend continued supportive care with increased fluids and over-the-counter antipyretics and expectorants.  Instructed to contact clinic if symptoms are worsening.     I discussed the assessment and treatment plan with the patient. The patient was provided an opportunity to ask questions and all were answered. The patient agreed with the plan and demonstrated an understanding of the instructions.   The patient was advised to call back or seek an in-person evaluation if the symptoms worsen or  if the condition fails to improve as anticipated.    [name], DO

## 2021-10-23 ENCOUNTER — Other Ambulatory Visit: Payer: Self-pay

## 2021-10-23 MED ORDER — METFORMIN HCL ER 500 MG PO TB24: 1000.0000 mg | ORAL_TABLET | Freq: Every day | ORAL | 1 refills | Status: DC

## 2021-10-31 ENCOUNTER — Telehealth: Payer: Self-pay | Admitting: Family Medicine

## 2021-10-31 ENCOUNTER — Ambulatory Visit (INDEPENDENT_AMBULATORY_CARE_PROVIDER_SITE_OTHER): Payer: Medicare Other | Admitting: Family Medicine

## 2021-10-31 ENCOUNTER — Other Ambulatory Visit: Payer: Self-pay

## 2021-10-31 ENCOUNTER — Encounter: Payer: Self-pay | Admitting: Family Medicine

## 2021-10-31 VITALS — BP 139/72 | HR 91 | Temp 98.0°F | Ht 69.0 in | Wt 216.0 lb

## 2021-10-31 DIAGNOSIS — Z23 Encounter for immunization: Secondary | ICD-10-CM

## 2021-10-31 DIAGNOSIS — I739 Peripheral vascular disease, unspecified: Secondary | ICD-10-CM | POA: Diagnosis not present

## 2021-10-31 NOTE — Assessment & Plan Note (Addendum)
Orders placed for updated ABIs.  Discussed modification of risk factors including smoking cessation, keeping diabetes and blood pressure under good control.

## 2021-10-31 NOTE — Telephone Encounter (Signed)
Erik Rose [phone] from [facility] called to notify you guys that Erik Rose signed up for the [employer] [id number] trail study for Diabetic macular edema. They stated they will be collecting a lot of labs from him and will forward all results for you guys to review.

## 2021-10-31 NOTE — Progress Notes (Signed)
Erik Rose - 66 y.o. male MRN [id number]  Date of birth: 07/17/1956  Subjective Chief Complaint  Patient presents with   Circulatory Problem    HPI Erik Rose is a 66 year old male here today with concern of circulation issues.  He had a screening from a nurse that was sent by his health insurance company showing that he may have decreased circulation in bilateral extremities.  He has noted that his right foot feels little cold at times.  He does have some numbness and tingling in the feet.  He denies any significant claudication symptoms at this time.  He does have risk factors of hypertension, diabetes, nicotine dependence.  ABIs completed in 2021 were unremarkable.  ROS:  A comprehensive ROS was completed and negative except as noted per HPI  No Known Allergies  Past Medical History:  Diagnosis Date   Diabetes mellitus without complication (HCC)    Diverticulitis    GERD (gastroesophageal reflux disease)    Heart disease    Hypertension    Stroke (HCC)    TIA (transient ischemic attack)     Past Surgical History:  Procedure Laterality Date   CARDIAC CATHETERIZATION     CORONARY ANGIOPLASTY WITH STENT PLACEMENT  2010    Social History   Socioeconomic History   Marital status: Married    Spouse name: [name]   Number of children: 4   Years of education: 14   Highest education level: Associate degree: academic program  Occupational History    Comment: Disability/retired.  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     Social Determinants of Health   Financial Resource Strain: Low Risk    Difficulty of Paying Living Expenses: Not hard at all  Food Insecurity: No Food Insecurity   Worried About Programme researcher, broadcasting/film/video in the Last Year: Never true   Ran Out of Food in the Last Year: Never true  Transportation Needs: No Transportation Needs   Lack of Transportation (Medical): No   Lack of Transportation (Non-Medical): No  Physical Activity: Sufficiently Active   Days of Exercise per Week: 7 days   Minutes of Exercise per Session: 120 min  Stress: No Stress Concern Present   Feeling of Stress : Not at all  Social Connections: Moderately Isolated   Frequency of Communication with Friends and Family: More than three times a week   Frequency of Social Gatherings with Friends and Family: More than three times a week   Attends Religious Services: Never   Database administrator or Organizations: No   Attends Banker Meetings: Never   Marital Status: Married    Family History  Problem Relation Age of Onset   Hypertension Mother    Kidney disease Mother    Miscarriages / [place] Mother    Stroke Mother    Heart disease Father    Depression Sister    Kidney disease Sister  Diabetes Brother    Hypertension Brother    Hyperlipidemia Brother    Kidney disease Brother     Health Maintenance  Topic Date Due   Hepatitis C Screening  Never done   COVID-19 Vaccine (4 - Booster) 11/18/2020   OPHTHALMOLOGY EXAM  06/21/2021   COLONOSCOPY (Pts 45-74yrs Insurance coverage will need to be confirmed)  12/08/2021 (Originally 01/17/2019)   FOOT EXAM  12/06/2021   HEMOGLOBIN A1C  02/12/2022   TETANUS/TDAP  07/19/2030   INFLUENZA VACCINE  Completed   HIV Screening  Completed   Zoster Vaccines- Shingrix  Completed   HPV VACCINES  Aged Out      ----------------------------------------------------------------------------------------------------------------------------------------------------------------------------------------------------------------- Physical Exam BP 139/72 (BP Location: Left Arm, Patient Position: Sitting, Cuff Size: Large)    Pulse 91    Temp 98 F (36.7 C)    Ht 5\' 9"  (1.753 m)    Wt 216 lb (98 kg)    SpO2 98%    BMI 31.90 kg/m   Physical Exam Constitutional:      Appearance: Normal appearance.  HENT:     Head: Normocephalic and atraumatic.  Cardiovascular:     Comments: Slightly diminished posterior tibial and dorsalis pedis pulses bilaterally.  Feet appear well-perfused. Neurological:     Mental Status: He is alert.    ------------------------------------------------------------------------------------------------------------------------------------------------------------------------------------------------------------------- Assessment and Plan  Intermittent claudication (HCC) Orders placed for updated ABIs.  Discussed modification of risk factors including smoking cessation, keeping diabetes and blood pressure under good control.   No orders of the defined types were placed in this encounter.   No follow-ups on file.    This visit occurred during the SARS-CoV-2 public health emergency.  Safety protocols were in place, including screening questions prior to the visit, additional usage of staff PPE, and extensive cleaning of exam room while observing appropriate contact time as indicated for disinfecting solutions.

## 2021-11-01 ENCOUNTER — Other Ambulatory Visit: Payer: Self-pay | Admitting: Family Medicine

## 2021-11-02 ENCOUNTER — Encounter: Payer: Self-pay | Admitting: Family Medicine

## 2021-11-15 ENCOUNTER — Other Ambulatory Visit: Payer: Self-pay

## 2021-11-15 ENCOUNTER — Ambulatory Visit (INDEPENDENT_AMBULATORY_CARE_PROVIDER_SITE_OTHER): Payer: Medicare Other | Admitting: Family Medicine

## 2021-11-15 ENCOUNTER — Encounter: Payer: Self-pay | Admitting: Family Medicine

## 2021-11-15 VITALS — BP 132/76 | HR 88 | Ht 70.0 in | Wt 221.0 lb

## 2021-11-15 DIAGNOSIS — E1165 Type 2 diabetes mellitus with hyperglycemia: Secondary | ICD-10-CM | POA: Diagnosis not present

## 2021-11-15 DIAGNOSIS — E782 Mixed hyperlipidemia: Secondary | ICD-10-CM | POA: Diagnosis not present

## 2021-11-15 DIAGNOSIS — I739 Peripheral vascular disease, unspecified: Secondary | ICD-10-CM

## 2021-11-15 DIAGNOSIS — I10 Essential (primary) hypertension: Secondary | ICD-10-CM

## 2021-11-15 DIAGNOSIS — Z794 Long term (current) use of insulin: Secondary | ICD-10-CM

## 2021-11-15 DIAGNOSIS — Z125 Encounter for screening for malignant neoplasm of prostate: Secondary | ICD-10-CM

## 2021-11-15 DIAGNOSIS — M5412 Radiculopathy, cervical region: Secondary | ICD-10-CM

## 2021-11-15 LAB — CBC WITH DIFFERENTIAL/PLATELET
Absolute Monocytes: 703 cells/uL (ref 200–950)
Basophils Absolute: 67 cells/uL (ref 0–200)
Basophils Relative: 0.7 %
Eosinophils Absolute: 257 cells/uL (ref 15–500)
Eosinophils Relative: 2.7 %
HCT: 38.3 % — ABNORMAL LOW (ref 38.5–50.0)
Hemoglobin: 12.6 g/dL — ABNORMAL LOW (ref 13.2–17.1)
Lymphs Abs: 2603 cells/uL (ref 850–3900)
MCH: 29.5 pg (ref 27.0–33.0)
MCHC: 32.9 g/dL (ref 32.0–36.0)
MCV: 89.7 fL (ref 80.0–100.0)
MPV: 11.3 fL (ref 7.5–12.5)
Monocytes Relative: 7.4 %
Neutro Abs: 5871 cells/uL (ref 1500–7800)
Neutrophils Relative %: 61.8 %
Platelets: 296 10*3/uL (ref 140–400)
RBC: 4.27 10*6/uL (ref 4.20–5.80)
RDW: 13.4 % (ref 11.0–15.0)
Total Lymphocyte: 27.4 %
WBC: 9.5 10*3/uL (ref 3.8–10.8)

## 2021-11-15 LAB — POCT GLYCOSYLATED HEMOGLOBIN (HGB A1C): HbA1c, POC (controlled diabetic range): 7.5 % — AB (ref 0.0–7.0)

## 2021-11-15 LAB — COMPLETE METABOLIC PANEL WITH GFR
AG Ratio: 1.5 (calc) (ref 1.0–2.5)
ALT: 11 U/L (ref 9–46)
AST: 14 U/L (ref 10–35)
Albumin: 4.1 g/dL (ref 3.6–5.1)
Alkaline phosphatase (APISO): 96 U/L (ref 35–144)
BUN: 16 mg/dL (ref 7–25)
CO2: 27 mmol/L (ref 20–32)
Calcium: 9.3 mg/dL (ref 8.6–10.3)
Chloride: 107 mmol/L (ref 98–110)
Creat: 0.88 mg/dL (ref 0.70–1.35)
Globulin: 2.8 g/dL (calc) (ref 1.9–3.7)
Glucose, Bld: 96 mg/dL (ref 65–99)
Potassium: 4.3 mmol/L (ref 3.5–5.3)
Sodium: 141 mmol/L (ref 135–146)
Total Bilirubin: 0.4 mg/dL (ref 0.2–1.2)
Total Protein: 6.9 g/dL (ref 6.1–8.1)
eGFR: 96 mL/min/{1.73_m2} (ref >=60)

## 2021-11-15 LAB — LIPID PANEL W/REFLEX DIRECT LDL
Cholesterol: 109 mg/dL (ref <200)
HDL: 35 mg/dL — ABNORMAL LOW (ref >=40)
LDL Cholesterol (Calc): 60 mg/dL (calc)
Non-HDL Cholesterol (Calc): 74 mg/dL (calc) (ref <130)
Total CHOL/HDL Ratio: 3.1 (calc) (ref <5.0)
Triglycerides: 67 mg/dL (ref <150)

## 2021-11-15 NOTE — Assessment & Plan Note (Signed)
Adding physical therapy.

## 2021-11-15 NOTE — Progress Notes (Signed)
Erik Rose - 66 y.o. male MRN [id number]  Date of birth: 1955/12/30  Subjective Chief Complaint  Patient presents with   Diabetes    HPI Erik Rose is a 66 year old male here today for follow-up visit.  He has upcoming visit with vascular to have updated ABIs.  Doing well with current medications for management of his diabetes.  Currently getting Lantus through patient assistance program which is worked out well for him.  A1c has improved to 7.5% today.  He denies any symptoms of hypoglycemia.  His diet has remained consistent.  Blood pressure is well controlled at this time as well.  No side effects from medication.  Denies chest pain, shortness of breath, palpitations, headaches or vision changes.    ROS:  A comprehensive ROS was completed and negative except as noted per HPI   No Known Allergies  Past Medical History:  Diagnosis Date   Diabetes mellitus without complication ([place])    Diverticulitis    GERD (gastroesophageal reflux disease)    Heart disease    Hypertension    Stroke ([place])    TIA (transient ischemic attack)     Past Surgical History:  Procedure Laterality Date   CARDIAC CATHETERIZATION     CORONARY ANGIOPLASTY WITH STENT PLACEMENT  2010    Social History   Socioeconomic History   Marital status: Married    Spouse name: [name]   Number of children: 4   Years of education: 14   Highest education level: Associate degree: academic program  Occupational History    Comment: Disability/retired.  Tobacco Use   Smoking status: Some Days    Packs/day: 0.50    Types: Cigarettes   Smokeless tobacco: Never  Vaping Use   Vaping Use: Never used  Substance and Sexual Activity   Alcohol use: Not Currently    Comment: quit 30 yrs ago   Drug use: Not Currently    Types: Marijuana, Cocaine    Comment: stopped 1991   Sexual activity: Not on file  Other Topics Concern   Not on file  Social History Narrative   Live alone. 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  Likes to [employer] range once a month.    Social Determinants of Health   Financial Resource Strain: Low Risk    Difficulty of Paying Living Expenses: Not hard at all  Food Insecurity: No Food Insecurity   Worried About Charity fundraiser in the Last Year: Never true   [place] in the Last Year: Never true  Transportation Needs: No Transportation Needs   Lack of Transportation (Medical): No   Lack of Transportation (Non-Medical): No  Physical Activity: Sufficiently Active   Days of Exercise per Week: 7 days   Minutes of Exercise per Session: 120 min  Stress: No Stress Concern Present   Feeling of Stress : Not at all  Social Connections: Moderately Isolated   Frequency of Communication with Friends and Family: More than three times a week   Frequency of Social Gatherings with Friends and Family: More than three times a week   Attends Religious Services: Never   Marine scientist or Organizations: No   Attends Archivist Meetings: Never   Marital Status: Married    Family History  Problem Relation Age of Onset   Hypertension Mother    Kidney disease Mother    Miscarriages / [place] Mother    Stroke Mother    Heart disease Father    Depression Sister  Kidney disease Sister    Diabetes Brother    Hypertension Brother    Hyperlipidemia Brother    Kidney disease Brother     Health Maintenance  Topic Date Due   Hepatitis C Screening  Never done   COLONOSCOPY (Pts 45-28yrs Insurance coverage will need to be confirmed)  12/08/2021 (Originally 01/17/2019)   COVID-19 Vaccine (4 - Booster) 12/01/2022 (Originally 11/18/2020)   FOOT EXAM  12/06/2021   HEMOGLOBIN A1C  05/15/2022   OPHTHALMOLOGY EXAM  08/27/2022   TETANUS/TDAP  07/19/2030   INFLUENZA VACCINE  Completed   HIV Screening  Completed   Zoster Vaccines- Shingrix  Completed   HPV VACCINES  Aged Out      ----------------------------------------------------------------------------------------------------------------------------------------------------------------------------------------------------------------- Physical Exam BP 132/76 (BP Location: Left Arm, Patient Position: Sitting, Cuff Size: Large)    Pulse 88    Ht 5\' 10"  (1.778 m)    Wt 221 lb (100.2 kg)    SpO2 98%    BMI 31.71 kg/m   Physical Exam Constitutional:      Appearance: Normal appearance.  Eyes:     General: No scleral icterus. Cardiovascular:     Rate and Rhythm: Normal rate and regular rhythm.  Pulmonary:     Effort: Pulmonary effort is normal.     Breath sounds: Normal breath sounds.  Musculoskeletal:     Cervical back: Neck supple.  Neurological:     General: No focal deficit present.     Mental Status: He is alert.  Psychiatric:        Mood and Affect: Mood normal.        Behavior: Behavior normal.    ------------------------------------------------------------------------------------------------------------------------------------------------------------------------------------------------------------------- Assessment and Plan  Essential hypertension Blood pressure remains well controlled at this time.  Continue current medications.  Type 2 diabetes mellitus with hyperglycemia, with long-term current use of insulin (HCC) Diabetes has improved.  Recommend continuation of current medications and encouraged her continue to work on dietary change and exercise.  Cervical radiculopathy Adding physical therapy.  Intermittent claudication ([place]) Has upcoming appointment with vascular for updated ABIs.  Mixed hyperlipidemia Continues to tolerate atorvastatin well.  Recommend continuation at current strength.   No orders of the defined types were placed in this encounter.   Return in about 6 months (around 05/15/2022) for HTN/T2DM.    This visit occurred during the SARS-CoV-2 public health  emergency.  Safety protocols were in place, including screening questions prior to the visit, additional usage of staff PPE, and extensive cleaning of exam room while observing appropriate contact time as indicated for disinfecting solutions.

## 2021-11-15 NOTE — Assessment & Plan Note (Signed)
Blood pressure remains well controlled at this time.  Continue current medications. 

## 2021-11-15 NOTE — Assessment & Plan Note (Signed)
Has upcoming appointment with vascular for updated ABIs.

## 2021-11-15 NOTE — Assessment & Plan Note (Signed)
Diabetes has improved.  Recommend continuation of current medications and encouraged her continue to work on dietary change and exercise.

## 2021-11-15 NOTE — Assessment & Plan Note (Signed)
Continues to tolerate atorvastatin well.  Recommend continuation at current strength.

## 2021-11-16 LAB — PSA: PSA: 2.24 ng/mL (ref <=4.00)

## 2021-11-19 ENCOUNTER — Ambulatory Visit (HOSPITAL_COMMUNITY)
Admission: RE | Admit: 2021-11-19 | Discharge: 2021-11-19 | Disposition: A | Discharge status: Home / Self Care | Payer: Medicare Other | Source: Ambulatory Visit | Attending: Internal Medicine | Admitting: Internal Medicine

## 2021-11-19 ENCOUNTER — Other Ambulatory Visit: Payer: Self-pay

## 2021-11-19 DIAGNOSIS — I739 Peripheral vascular disease, unspecified: Secondary | ICD-10-CM | POA: Insufficient documentation

## 2021-11-21 ENCOUNTER — Other Ambulatory Visit (HOSPITAL_COMMUNITY): Payer: Self-pay | Admitting: Family Medicine

## 2021-11-21 DIAGNOSIS — I739 Peripheral vascular disease, unspecified: Secondary | ICD-10-CM

## 2021-11-22 ENCOUNTER — Telehealth: Payer: Self-pay

## 2021-11-22 DIAGNOSIS — E1165 Type 2 diabetes mellitus with hyperglycemia: Secondary | ICD-10-CM

## 2021-11-22 MED ORDER — LANTUS SOLOSTAR 100 UNIT/ML ~~LOC~~ SOPN: PEN_INJECTOR | SUBCUTANEOUS | 2 refills | Status: DC

## 2021-11-22 NOTE — Telephone Encounter (Signed)
Completed patient's PAP application with [employer] for Lantus insulin.   Pt is aware signatures are required. Forms placed in [place].   After pt signs documents please return signed forms to [place].

## 2021-11-27 ENCOUNTER — Encounter: Payer: Self-pay | Admitting: Physical Therapy

## 2021-11-27 ENCOUNTER — Other Ambulatory Visit: Payer: Self-pay

## 2021-11-27 ENCOUNTER — Ambulatory Visit: Payer: Medicare Other | Attending: Family Medicine | Admitting: Physical Therapy

## 2021-11-27 DIAGNOSIS — M5412 Radiculopathy, cervical region: Secondary | ICD-10-CM | POA: Insufficient documentation

## 2021-11-27 DIAGNOSIS — M5440 Lumbago with sciatica, unspecified side: Secondary | ICD-10-CM | POA: Diagnosis present

## 2021-11-27 DIAGNOSIS — M6281 Muscle weakness (generalized): Secondary | ICD-10-CM | POA: Diagnosis present

## 2021-11-27 DIAGNOSIS — M25551 Pain in right hip: Secondary | ICD-10-CM | POA: Insufficient documentation

## 2021-11-27 DIAGNOSIS — G8929 Other chronic pain: Secondary | ICD-10-CM | POA: Insufficient documentation

## 2021-11-27 NOTE — Therapy (Signed)
[facility] [place], [place], [address] Phone: [phone]   Fax:  [phone]  Physical Therapy Evaluation  Patient Details  Name: Erik Rose MRN: [id number] Date of Birth: 1956-06-30 Referring Provider (PT): [name]   Encounter Date: 11/27/2021   PT End of Session - 11/27/21 0947     Visit Number 1    Number of Visits 20    Date for PT Re-Evaluation 02/05/22    PT Start Time 0852    PT Stop Time 0928    PT Time Calculation (min) 36 min    Activity Tolerance Patient tolerated treatment well    Behavior During Therapy [facility] for tasks assessed/performed             Past Medical History:  Diagnosis Date   Diabetes mellitus without complication ([place])    Diverticulitis    GERD (gastroesophageal reflux disease)    Heart disease    Hypertension    Stroke ([place])    TIA (transient ischemic attack)     Past Surgical History:  Procedure Laterality Date   CARDIAC CATHETERIZATION     CORONARY ANGIOPLASTY WITH STENT PLACEMENT  2010    There were no vitals filed for this visit.    Subjective Assessment - 11/27/21 0856     Subjective Chronic DDD in cervical area. About a month ago, he started to notice tingling and numbness in LUE, all the way to entire hand. No specific pattern or triggers noted. Presently iwth mild tingling and mild pain in lateral elbow.    Pertinent History DDD cervical, DM, HTN, Stroke, Heart Disease, intermittent claudication    How long can you sit comfortably? N/A    How long can you stand comfortably? N/A    How long can you walk comfortably? N/A    Patient Stated Goals No pain or tingling.    Currently in Pain? Yes    Pain Score 3     Pain Location Arm    Pain Orientation Left;Other (Comment)   entire arm numbness, Lateral epicondyle mild pain   Pain Descriptors / Indicators Tingling;Burning;Sore    Pain Type Acute pain;Chronic pain    Pain Radiating Towards into  hand    Pain Onset More than a month ago    Pain Frequency Intermittent    Aggravating Factors  unknown    Pain Relieving Factors unknown                [facility] PT Assessment - 11/27/21 0001       Assessment   Medical Diagnosis cervical radiculopathy    Referring Provider (PT) [place], [place]   Has the patient fallen in the past 6 months Yes      [place] residence    Living Arrangements Alone    Additional Comments No problems      Prior Function   Level of [place] Retired    Leisure DJ, [place]   Overall Cognitive Status Within Functional Limits for tasks assessed      Sensation   Light Touch Impaired by gross assessment;Impaired Detail    Light Touch Impaired Details Impaired LUE    Additional Comments diminished and tingling thoruhgout L hand.      Posture/Postural Control   Posture Comments Mild L shoulder elevation, mild L shift of trunk over hips.  ROM / Strength   AROM / PROM / Strength AROM;Strength      AROM   Overall AROM Comments BUE grossly WFL, except shoulder ER limited to 70%. Cervical, approximately 80% in B rotation and lateral flexion,      Strength   Overall Strength Comments BUE strength grossly equal and WFL.      Palpation   Palpation comment Tightness noted in BUp Traps, with TP on L.      Special Tests    Special Tests Cervical    Cervical Tests Spurling's;other;other2      Spurling's   Findings Negative    Comment tested B      other    Findings Positive    Side Left    Comment Upper LimbTension Test-Patient reportred tingling and pain in L neck, upper/medial scap, elbow with assessment.      other    Findings Positive    Side Left    Comment Neural Tension Test-Positive for Median nerve.      Ambulation/Gait   Gait Comments WNL                        Objective measurements completed on  examination: See above findings.                PT Education - 11/27/21 0945     Education Details POC- Positive assessment findings for L cervical radiculopathy.    Person(s) Educated Patient    Methods Explanation    Comprehension Verbalized understanding              PT Short Term Goals - 11/27/21 0955       PT SHORT TERM GOAL #1   Title I with basic HEP    Time 4    Period Weeks    Status New    Target Date 12/25/21               PT Long Term Goals - 11/27/21 0956       PT LONG TERM GOAL #1   Title I with final HEP    Time 10    Period Weeks    Status New    Target Date 02/05/22      PT LONG TERM GOAL #2   Title Patient will perform his normal daily activities with reports of T/N </+3/10 for a full week.    Baseline Up to 7/10    Time 10    Period Weeks    Status New    Target Date 02/05/22      PT LONG TERM GOAL #3   Title When T/N flairs, patient will identify 2 techniques which reduce the symptoms to <3/10.    Baseline Currently has no treatment options, just sits and rests.    Time 10    Period Weeks    Status New    Target Date 02/05/22      PT LONG TERM GOAL #4   Title Patient will stand with B shoulders in relaxed position, B Up Traps relaxed with no TP.    Baseline Currently holds B sholders hiked with tightness and TP in B Up Traps.    Time 10    Period Weeks    Status New    Target Date 02/05/22                    Plan - 11/27/21 0948     Clinical Impression Statement  Patient presents with chronic cervical DDD, chronic R UE pain, tingling, numbness. New onset about a month ago of L UE intermittent T/N. Neural tension tests were positive on L for impingement. He will benefit from PT for strengthening, stretching, TP therapy, and identification of positions which resolve LUE T/N and pain.    Personal Factors and Comorbidities Past/Current Experience;Comorbidity 2    Comorbidities DM, DDD    Examination-Activity  Limitations Carry;Reach Overhead    Stability/Clinical Decision Making Evolving/Moderate complexity    Clinical Decision Making Moderate    Rehab Potential Good    PT Frequency 2x / week    PT Duration Other (comment)   10w   PT Treatment/Interventions ADLs/Self Care Home Management;Iontophoresis 4mg /ml Dexamethasone;Moist Heat;Traction;Electrical Stimulation;Therapeutic exercise;Therapeutic activities;Patient/family education;Manual techniques;Dry needling;Passive range of motion    PT Next Visit Plan HEP    Consulted and Agree with Plan of Care Patient             Patient will benefit from skilled therapeutic intervention in order to improve the following deficits and impairments:  Decreased range of motion, Increased fascial restricitons, Pain, Decreased strength, Impaired sensation, Postural dysfunction, Improper body mechanics, Impaired flexibility  Visit Diagnosis: Radiculopathy, cervical region     Problem List Patient Active Problem List   Diagnosis Date Noted   Intermittent claudication ([place]) 10/31/2021   COVID-19 10/03/2021   Left knee pain 08/15/2021   Lumbar spinal stenosis 03/15/2021   Pancreatitis 12/10/2020   Cervical radiculopathy 04/18/2020   Smoking greater than 20 pack years 04/18/2020   Eczema 01/18/2020   Colon cancer screening 12/09/2018   Essential hypertension 09/09/2018   Type 2 diabetes mellitus with hyperglycemia, with long-term current use of insulin ([place]) 09/09/2018   CAD (coronary artery disease), native coronary artery 09/09/2018   History of CVA (cerebrovascular accident) 09/09/2018   Mixed hyperlipidemia 02/05/2018    Marcelina Morel, DPT 11/27/2021, 10:04 AM  [facility]. [place], [place], [address] Phone: [phone]   Fax:  [phone]  Name: Erik Rose MRN: [id number] Date of Birth: 07-19-1956

## 2021-11-28 ENCOUNTER — Other Ambulatory Visit: Payer: Self-pay | Admitting: Family Medicine

## 2021-11-28 ENCOUNTER — Ambulatory Visit (HOSPITAL_COMMUNITY)
Admission: RE | Admit: 2021-11-28 | Discharge: 2021-11-28 | Disposition: A | Discharge status: Home / Self Care | Payer: Medicare Other | Source: Ambulatory Visit | Attending: Cardiovascular Disease | Admitting: Cardiovascular Disease

## 2021-11-28 DIAGNOSIS — I739 Peripheral vascular disease, unspecified: Secondary | ICD-10-CM | POA: Diagnosis not present

## 2021-12-04 ENCOUNTER — Encounter: Payer: Self-pay | Admitting: Physical Therapy

## 2021-12-04 ENCOUNTER — Other Ambulatory Visit: Payer: Self-pay

## 2021-12-04 ENCOUNTER — Other Ambulatory Visit: Payer: Self-pay | Admitting: Family Medicine

## 2021-12-04 ENCOUNTER — Ambulatory Visit: Payer: Medicare Other | Admitting: Physical Therapy

## 2021-12-04 ENCOUNTER — Telehealth: Payer: Self-pay | Admitting: Family Medicine

## 2021-12-04 DIAGNOSIS — M5440 Lumbago with sciatica, unspecified side: Secondary | ICD-10-CM

## 2021-12-04 DIAGNOSIS — G8929 Other chronic pain: Secondary | ICD-10-CM

## 2021-12-04 DIAGNOSIS — Z794 Long term (current) use of insulin: Secondary | ICD-10-CM

## 2021-12-04 DIAGNOSIS — M5412 Radiculopathy, cervical region: Secondary | ICD-10-CM

## 2021-12-04 DIAGNOSIS — E119 Type 2 diabetes mellitus without complications: Secondary | ICD-10-CM

## 2021-12-04 DIAGNOSIS — M25551 Pain in right hip: Secondary | ICD-10-CM

## 2021-12-04 DIAGNOSIS — M6281 Muscle weakness (generalized): Secondary | ICD-10-CM

## 2021-12-04 NOTE — Therapy (Signed)
[facility]. [place], [place], [address] Phone: [phone]   Fax:  [phone]  Physical Therapy Treatment  Patient Details  Name: Erik Rose MRN: [id number] Date of Birth: [birth year] Referring Provider (PT): [name]   Encounter Date: 12/04/2021   PT End of Session - 12/04/21 1012     Visit Number 2    Date for PT Re-Evaluation 02/05/22    PT Start Time 0941    PT Stop Time 6629    PT Time Calculation (min) 34 min    Activity Tolerance Patient tolerated treatment well    Behavior During Therapy [place] for tasks assessed/performed             Past Medical History:  Diagnosis Date   Diabetes mellitus without complication ([place])    Diverticulitis    GERD (gastroesophageal reflux disease)    Heart disease    Hypertension    Stroke ([place])    TIA (transient ischemic attack)     Past Surgical History:  Procedure Laterality Date   CARDIAC CATHETERIZATION     CORONARY ANGIOPLASTY WITH STENT PLACEMENT  2010    There were no vitals filed for this visit.   Subjective Assessment - 12/04/21 0944     Subjective Tingling down his L arm.    Currently in Pain? No/denies    Pain Location Hand    Pain Orientation Left    Pain Descriptors / Indicators Tingling                               [facility] Adult PT Treatment/Exercise - 12/04/21 0001       Exercises   Exercises Neck      Neck Exercises: Machines for Strengthening   Nustep L4 x 6 min      Neck Exercises: Standing   Other Standing Exercises Rows & Ext Green 2x10    Other Standing Exercises Shoulder Flex 4lb x10      Neck Exercises: Seated   Neck Retraction 3 secs   x2   Shoulder ABduction Both;20 reps    Shoulder Abduction Limitations green Tband    Other Seated Exercise ER green 2x10                       PT Short Term Goals - 12/04/21 1013       PT SHORT TERM GOAL #1   Title I with basic HEP     Status Partially Met               PT Long Term Goals - 11/27/21 0956       PT LONG TERM GOAL #1   Title I with final HEP    Time 10    Period Weeks    Status New    Target Date 02/05/22      PT LONG TERM GOAL #2   Title Patient will perform his normal daily activities with reports of T/N </+3/10 for a full week.    Baseline Up to 7/10    Time 10    Period Weeks    Status New    Target Date 02/05/22      PT LONG TERM GOAL #3   Title When T/N flairs, patient will identify 2 techniques which reduce the symptoms to <3/10.    Baseline Currently has no treatment options, just sits and rests.  Time 10    Period Weeks    Status New    Target Date 02/05/22      PT LONG TERM GOAL #4   Title Patient will stand with B shoulders in relaxed position, B Up Traps relaxed with no TP.    Baseline Currently holds B sholders hiked with tightness and TP in B Up Traps.    Time 10    Period Weeks    Status New    Target Date 02/05/22                   Plan - 12/04/21 1013     Clinical Impression Statement Pt ~ 11 minutes late for today's session.He did well tolerating a initial progression to TE. Cues for core engagement needed with rows and extensions. No report of pain with cervical retractions. Cues to keep elbows extended with shoulder flexion.    Personal Factors and Comorbidities Past/Current Experience;Comorbidity 2    Comorbidities DM, DDD    Examination-Activity Limitations Carry;Reach Overhead    Rehab Potential Good    PT Frequency 2x / week    PT Duration Other (comment)    PT Treatment/Interventions ADLs/Self Care Home Management;Iontophoresis 52m/ml Dexamethasone;Moist Heat;Traction;Electrical Stimulation;Therapeutic exercise;Therapeutic activities;Patient/family education;Manual techniques;Dry needling;Passive range of motion    PT Next Visit Plan postural strength, HEP             Patient will benefit from skilled therapeutic intervention in  order to improve the following deficits and impairments:  Decreased range of motion, Increased fascial restricitons, Pain, Decreased strength, Impaired sensation, Postural dysfunction, Improper body mechanics, Impaired flexibility  Visit Diagnosis: Radiculopathy, cervical region  Chronic bilateral low back pain with sciatica, sciatica laterality unspecified  Muscle weakness (generalized)  Pain in right hip     Problem List Patient Active Problem List   Diagnosis Date Noted   Intermittent claudication ([place] 10/31/2021   COVID-19 10/03/2021   Left knee pain 08/15/2021   Lumbar spinal stenosis 03/15/2021   Pancreatitis 12/10/2020   Cervical radiculopathy 04/18/2020   Smoking greater than 20 pack years 04/18/2020   Eczema 01/18/2020   Colon cancer screening 12/09/2018   Essential hypertension 09/09/2018   Type 2 diabetes mellitus with hyperglycemia, with long-term [place] use of insulin ([place] 09/09/2018   CAD (coronary artery disease), native coronary artery 09/09/2018   History of CVA (cerebrovascular accident) 09/09/2018   Mixed hyperlipidemia 02/05/2018    RScot Jun PTA 12/04/2021, 10:15 AM  [place] [address]Phone: [phone]  Fax:  [phone] Name: SKahleb McclaneMRN: [id number] of Birth: [birth year]

## 2021-12-04 NOTE — Telephone Encounter (Signed)
Patient came by and signed the [facility] papers, handed straight to Dr Ashley Royalty. AM

## 2021-12-11 ENCOUNTER — Ambulatory Visit: Payer: Medicare PPO | Attending: Family Medicine | Admitting: Physical Therapy

## 2021-12-11 ENCOUNTER — Encounter: Payer: Self-pay | Admitting: Physical Therapy

## 2021-12-11 ENCOUNTER — Other Ambulatory Visit: Payer: Self-pay

## 2021-12-11 DIAGNOSIS — M5412 Radiculopathy, cervical region: Secondary | ICD-10-CM | POA: Diagnosis not present

## 2021-12-11 DIAGNOSIS — M5440 Lumbago with sciatica, unspecified side: Secondary | ICD-10-CM

## 2021-12-11 DIAGNOSIS — M6281 Muscle weakness (generalized): Secondary | ICD-10-CM | POA: Diagnosis present

## 2021-12-11 DIAGNOSIS — M25551 Pain in right hip: Secondary | ICD-10-CM | POA: Insufficient documentation

## 2021-12-11 DIAGNOSIS — G8929 Other chronic pain: Secondary | ICD-10-CM | POA: Diagnosis present

## 2021-12-11 NOTE — Therapy (Signed)
[facility] ?[place] ?[address] ?[place], [place], [address] ?Phone: [phone]   Fax:  [phone] ? ?Physical Therapy Treatment ? ?Patient Details  ?Name: Erik Rose ?MRN: [id number] ?Date of Birth: March 13, 1956 ?Referring Provider (PT): [name] ? ? ?Encounter Date: 12/11/2021 ? ? PT End of Session - 12/11/21 1009   ? ? Visit Number 4   ? Date for PT Re-Evaluation 02/05/22   ? PT Start Time 0930   ? PT Stop Time 1013   ? PT Time Calculation (min) 43 min   ? Activity Tolerance Patient tolerated treatment well   ? Behavior During Therapy [facility] for tasks assessed/performed   ? ?  ?  ? ?  ? ? ?Past Medical History:  ?Diagnosis Date  ? Diabetes mellitus without complication ([place])   ? Diverticulitis   ? GERD (gastroesophageal reflux disease)   ? Heart disease   ? Hypertension   ? Stroke [facility])   ? TIA (transient ischemic attack)   ? ? ?Past Surgical History:  ?Procedure Laterality Date  ? CARDIAC CATHETERIZATION    ? CORONARY ANGIOPLASTY WITH STENT PLACEMENT  2010  ? ? ?There were no vitals filed for this visit. ? ? Subjective Assessment - 12/11/21 0934   ? ? Subjective Doing pretty good   ? Currently in Pain? Yes   ? Pain Score 6    ? Pain Location Foot   ? Pain Orientation Right   ? ?  ?  ? ?  ? ? ? ? ? ? ? ? ? ? ? ? ? ? ? ? ? ? ? ? [facility] Adult PT Treatment/Exercise - 12/11/21 0001   ? ?  ? Neck Exercises: Machines for Strengthening  ? Nustep L4 x 6 min   ? Other Machines for Strengthening Rows & Lats 25lb 2x10   ?  ? Neck Exercises: Standing  ? Other Standing Exercises Rows 15lb, Ext 10lb 2x10   ? Other Standing Exercises Shoulder Flex 4lb, Abd 3lb  2x10   ?  ? Neck Exercises: Seated  ? Other Seated Exercise Er & horiz abd red 2x10   ? Other Seated Exercise Sit to stand OHP yellow 2x10   ? ?  ?  ? ?  ? ? ? ? ? ? ? ? ? ? ? ? PT Short Term Goals - 12/04/21 1013   ? ?  ? PT SHORT TERM GOAL #1  ? Title I with basic HEP   ? Status Partially Met   ? ?  ?  ? ?  ? ? ? ? PT  Long Term Goals - 11/27/21 0956   ? ?  ? PT LONG TERM GOAL #1  ? Title I with final HEP   ? Time 10   ? Period Weeks   ? Status New   ? Target Date 02/05/22   ?  ? PT LONG TERM GOAL #2  ? Title Patient will perform his normal daily activities with reports of T/N </+3/10 for a full week.   ? Baseline Up to 7/10   ? Time 10   ? Period Weeks   ? Status New   ? Target Date 02/05/22   ?  ? PT LONG TERM GOAL #3  ? Title When T/N flairs, patient will identify 2 techniques which reduce the symptoms to <3/10.   ? Baseline Currently has no treatment options, just sits and rests.   ? Time 10   ? Period Weeks   ?  Status New   ? Target Date 02/05/22   ?  ? PT LONG TERM GOAL #4  ? Title Patient will stand with B shoulders in relaxed position, B Up Traps relaxed with no TP.   ? Baseline Currently holds B sholders hiked with tightness and TP in B Up Traps.   ? Time 10   ? Period Weeks   ? Status New   ? Target Date 02/05/22   ? ?  ?  ? ?  ? ? ? ? ? ? ? ? Plan - 12/11/21 1009   ? ? Clinical Impression Statement Pt enters clinic feeling well but with R foot pain from circulation issues. He did well with a progression with postural strength. Tactile cues to trunk needed with to prevent trunk flexion with shoulder extensions. increase fatigue with sit to stands. No reports of increase pain throughout session.   ? Personal Factors and Comorbidities Past/Current Experience;Comorbidity 2   ? Comorbidities DM, DDD   ? Examination-Activity Limitations Carry;Reach Overhead   ? Stability/Clinical Decision Making Evolving/Moderate complexity   ? Rehab Potential Good   ? PT Frequency 2x / week   ? PT Treatment/Interventions ADLs/Self Care Home Management;Iontophoresis 58m/ml Dexamethasone;Moist Heat;Traction;Electrical Stimulation;Therapeutic exercise;Therapeutic activities;Patient/family education;Manual techniques;Dry needling;Passive range of motion   ? PT Next Visit Plan postural strength   ? ?  ?  ? ?  ? ? ?Patient will benefit from  skilled therapeutic intervention in order to improve the following deficits and impairments:  Decreased range of motion, Increased fascial restricitons, Pain, Decreased strength, Impaired sensation, Postural dysfunction, Improper body mechanics, Impaired flexibility ? ?Visit Diagnosis: ?Radiculopathy, cervical region ? ?Chronic bilateral low back pain with sciatica, sciatica laterality unspecified ? ?Muscle weakness (generalized) ? ?Pain in right hip ? ? ? ? ?Problem List ?Patient Active Problem List  ? Diagnosis Date Noted  ? Intermittent claudication ([facility] 10/31/2021  ? COVID-19 10/03/2021  ? Left knee pain 08/15/2021  ? Lumbar spinal stenosis 03/15/2021  ? Pancreatitis 12/10/2020  ? Cervical radiculopathy 04/18/2020  ? Smoking greater than 20 pack years 04/18/2020  ? Eczema 01/18/2020  ? Colon cancer screening 12/09/2018  ? Essential hypertension 09/09/2018  ? Type 2 diabetes mellitus with hyperglycemia, with long-term current use of insulin ([place] 09/09/2018  ? CAD (coronary artery disease), native coronary artery 09/09/2018  ? History of CVA (cerebrovascular accident) 09/09/2018  ? Mixed hyperlipidemia 02/05/2018  ? ? ?RScot Jun PTA ?12/11/2021, 10:12 AM ? ?[facility] ?[address] ?[place] [address]?Phone: [phone]  Fax:  [phone]? ?Name: SHasani Diemer?MRN: [id number]?Date of Birth: 1Sep 15, 1957? ? ? ?

## 2021-12-13 ENCOUNTER — Other Ambulatory Visit: Payer: Self-pay

## 2021-12-13 ENCOUNTER — Encounter: Payer: Self-pay | Admitting: Physical Therapy

## 2021-12-13 ENCOUNTER — Ambulatory Visit: Payer: Medicare PPO | Admitting: Physical Therapy

## 2021-12-13 DIAGNOSIS — M5412 Radiculopathy, cervical region: Secondary | ICD-10-CM | POA: Diagnosis not present

## 2021-12-13 DIAGNOSIS — M5440 Lumbago with sciatica, unspecified side: Secondary | ICD-10-CM

## 2021-12-13 DIAGNOSIS — M6281 Muscle weakness (generalized): Secondary | ICD-10-CM

## 2021-12-13 NOTE — Therapy (Signed)
[facility] ?[place] ?[address] ?[place], [place], [address] ?Phone: [phone]   Fax:  [phone] ? ?Physical Therapy Treatment ? ?Patient Details  ?Name: Erik Rose ?MRN: [id number] ?Date of Birth: Dec 18, 1955 ?Referring Provider (PT): Erik Rose ? ? ?Encounter Date: 12/13/2021 ? ? PT End of Session - 12/13/21 1009   ? ? Visit Number 5   ? Number of Visits 20   ? Date for PT Re-Evaluation 02/05/22   ? PT Start Time 0930   ? PT Stop Time 1013   ? PT Time Calculation (min) 43 min   ? Activity Tolerance Patient tolerated treatment well   ? Behavior During Therapy [facility] for tasks assessed/performed   ? ?  ?  ? ?  ? ? ?Past Medical History:  ?Diagnosis Date  ? Diabetes mellitus without complication ([place])   ? Diverticulitis   ? GERD (gastroesophageal reflux disease)   ? Heart disease   ? Hypertension   ? Stroke Erik Rose)   ? TIA (transient ischemic attack)   ? ? ?Past Surgical History:  ?Procedure Laterality Date  ? CARDIAC CATHETERIZATION    ? CORONARY ANGIOPLASTY WITH STENT PLACEMENT  2010  ? ? ?There were no vitals filed for this visit. ? ? Subjective Assessment - 12/13/21 0934   ? ? Subjective R foot is about a 8 right now, woke him up last night, leg pain is due to circulation.   Shoulders are improving, numbness is subsiding   ? Currently in Pain? Yes   ? Pain Score 8    ? Pain Location Foot   ? ?  ?  ? ?  ? ? ? ? ? ? ? ? ? ? ? ? ? ? ? ? ? ? ? ? [facility] Adult PT Treatment/Exercise - 12/13/21 0001   ? ?  ? Neck Exercises: Machines for Strengthening  ? UBE (Upper Arm Bike) L1.5 x104mn each   ? Other Machines for Strengthening Rows & Lats 25lb 2x10   ?  ? Neck Exercises: Standing  ? Other Standing Exercises Ext 10lb 2x10   ? Other Standing Exercises Shoulder Flex 4lb, Abd 3lb  2x10   ?  ? Neck Exercises: Seated  ? Neck Retraction 10 reps;3 secs   ? Other Seated Exercise Er & horiz abd green 2x10   ? ?  ?  ? ?  ? ? ? ? ? ? ? ? ? ? ? ? PT Short Term Goals - 12/04/21 1013    ? ?  ? PT SHORT TERM GOAL #1  ? Title I with basic HEP   ? Status Partially Met   ? ?  ?  ? ?  ? ? ? ? PT Long Term Goals - 11/27/21 0956   ? ?  ? PT LONG TERM GOAL #1  ? Title I with final HEP   ? Time 10   ? Period Weeks   ? Status New   ? Target Date 02/05/22   ?  ? PT LONG TERM GOAL #2  ? Title Patient will perform his normal daily activities with reports of T/N </+3/10 for a full week.   ? Baseline Up to 7/10   ? Time 10   ? Period Weeks   ? Status New   ? Target Date 02/05/22   ?  ? PT LONG TERM GOAL #3  ? Title When T/N flairs, patient will identify 2 techniques which reduce the symptoms to <3/10.   ?  Baseline Currently has no treatment options, just sits and rests.   ? Time 10   ? Period Weeks   ? Status New   ? Target Date 02/05/22   ?  ? PT LONG TERM GOAL #4  ? Title Patient will stand with B shoulders in relaxed position, B Up Traps relaxed with no TP.   ? Baseline Currently holds B sholders hiked with tightness and TP in B Up Traps.   ? Time 10   ? Period Weeks   ? Status New   ? Target Date 02/05/22   ? ?  ?  ? ?  ? ? ? ? ? ? ? ? Plan - 12/13/21 1010   ? ? Clinical Impression Statement Pt enters clinic reporting improvement with less numbness and tingling in UE. He continues to have foot pain but he stated that sis due to circulation issues that needs to be fixed. Interventions performed with increase resistance and or weight. Postural cues needed to prevent trunk leaning with seated rows. Tactile cues to elbow needed with ER.   ? Personal Factors and Comorbidities Past/Current Experience;Comorbidity 2   ? Comorbidities DM, DDD   ? Examination-Activity Limitations Carry;Reach Overhead   ? Stability/Clinical Decision Making Evolving/Moderate complexity   ? Rehab Potential Good   ? PT Frequency 2x / week   ? PT Treatment/Interventions ADLs/Self Care Home Management;Iontophoresis 26m/ml Dexamethasone;Moist Heat;Traction;Electrical Stimulation;Therapeutic exercise;Therapeutic activities;Patient/family  education;Manual techniques;Dry needling;Passive range of motion   ? PT Next Visit Plan postural strength   ? ?  ?  ? ?  ? ? ?Patient will benefit from skilled therapeutic intervention in order to improve the following deficits and impairments:  Decreased range of motion, Increased fascial restricitons, Pain, Decreased strength, Impaired sensation, Postural dysfunction, Improper body mechanics, Impaired flexibility ? ?Visit Diagnosis: ?Radiculopathy, cervical region ? ?Muscle weakness (generalized) ? ?Chronic bilateral low back pain with sciatica, sciatica laterality unspecified ? ? ? ? ?Problem List ?Patient Active Problem List  ? Diagnosis Date Noted  ? Intermittent claudication ([facility] 10/31/2021  ? COVID-19 10/03/2021  ? Left knee pain 08/15/2021  ? Lumbar spinal stenosis 03/15/2021  ? Pancreatitis 12/10/2020  ? Cervical radiculopathy 04/18/2020  ? Smoking greater than 20 pack years 04/18/2020  ? Eczema 01/18/2020  ? Colon cancer screening 12/09/2018  ? Essential hypertension 09/09/2018  ? Type 2 diabetes mellitus with hyperglycemia, with long-term current use of insulin ([facility] 09/09/2018  ? CAD (coronary artery disease), native coronary artery 09/09/2018  ? History of CVA (cerebrovascular accident) 09/09/2018  ? Mixed hyperlipidemia 02/05/2018  ? ? ?RScot Rose PTA ?12/13/2021, 10:13 AM ? ?[facility] ?[address] ?[place] [address]?Phone: [phone]  Fax:  [phone]? ?Name: STollie Rose?MRN: [id number]?Date of Birth: [birth year]? ? ? ?

## 2021-12-18 ENCOUNTER — Ambulatory Visit: Payer: Medicare PPO | Admitting: Physical Therapy

## 2021-12-20 ENCOUNTER — Encounter: Payer: Self-pay | Admitting: Physical Therapy

## 2021-12-20 ENCOUNTER — Other Ambulatory Visit: Payer: Self-pay

## 2021-12-20 ENCOUNTER — Ambulatory Visit: Payer: Medicare PPO | Admitting: Physical Therapy

## 2021-12-20 DIAGNOSIS — M5412 Radiculopathy, cervical region: Secondary | ICD-10-CM | POA: Diagnosis not present

## 2021-12-20 MED ORDER — MELOXICAM 15 MG PO TABS: 15.0000 mg | ORAL_TABLET | Freq: Every day | ORAL | 0 refills | Status: DC | PRN

## 2021-12-20 NOTE — Patient Instructions (Signed)
Access Code: [facility] ?URL: [email or web address] ?Date: 12/20/2021 ?Prepared by: Oley Balm ? ?Exercises ?Shoulder Extension with Resistance - 1 x daily - 7 x weekly - 2 sets - 10 reps - 1 hold ?Standing Row with Anchored Resistance - 1 x daily - 7 x weekly - 3 sets - 10 reps ?Standing Shoulder Row with Anchored Resistance - 1 x daily - 7 x weekly - 3 sets - 10 reps ?Seated Upper Trapezius Stretch - 1 x daily - 7 x weekly - 3 reps ?Seated Scapular Retraction - 1 x daily - 7 x weekly - 3 reps - 10 hold ?Seated Flexion Stretch with Swiss Ball - 1 x daily - 7 x weekly - 3 reps - 10 hold ? ?

## 2021-12-20 NOTE — Therapy (Signed)
[facility] ?[facility] ?[address] ?[place], [place], [address] ?Phone: [phone]   Fax:  [phone] ? ?Physical Therapy Treatment ? ?Patient Details  ?Name: Erik Rose ?MRN: [id number] ?Date of Birth: 05-14-1956 ?Referring Provider (PT): Everrett Coombe ? ? ?Encounter Date: 12/20/2021 ? ? PT End of Session - 12/20/21 1056   ? ? Visit Number 6   ? Number of Visits 20   ? Date for PT Re-Evaluation 02/05/22   ? PT Start Time 0930   ? PT Stop Time 1015   ? PT Time Calculation (min) 45 min   ? Activity Tolerance Patient tolerated treatment well   ? Behavior During Therapy [facility] for tasks assessed/performed   ? ?  ?  ? ?  ? ? ?Past Medical History:  ?Diagnosis Date  ? Diabetes mellitus without complication ([facility])   ? Diverticulitis   ? GERD (gastroesophageal reflux disease)   ? Heart disease   ? Hypertension   ? Stroke [facility])   ? TIA (transient ischemic attack)   ? ? ?Past Surgical History:  ?Procedure Laterality Date  ? CARDIAC CATHETERIZATION    ? CORONARY ANGIOPLASTY WITH STENT PLACEMENT  2010  ? ? ?There were no vitals filed for this visit. ? ? Subjective Assessment - 12/20/21 0933   ? ? Subjective R foot is about a 8 right now, started at 10/10 this morning. L shoulder and arm numbness continues to improve, but still annoying. not as intense as it was.   ? Pertinent History DDD cervical, DM, HTN, Stroke, Heart Disease, intermittent claudication   ? Currently in Pain? Yes   ? Pain Score 8    ? Pain Location Foot   ? Pain Orientation Right   ? Pain Descriptors / Indicators Throbbing   ? Pain Type Chronic pain;Acute pain   ? Pain Onset More than a month ago   ? Pain Frequency Intermittent   ? ?  ?  ? ?  ? ? ? ? ? ? ? ? ? ? ? ? ? ? ? ? ? ? ? ? [facility] Adult PT Treatment/Exercise - 12/20/21 0001   ? ?  ? Neck Exercises: Machines for Strengthening  ? UBE (Upper Arm Bike) L1.5 x28min each   ?  ? Neck Exercises: Stretches  ? Other Neck Stretches Neural glides for median, radial,  ulnar nerves   ? ?  ?  ? ?  ? ? ? ? ? ? ? ? ? ? PT Education - 12/20/21 1008   ? ? Education Details HEP, neural glides   ? Person(s) Educated Patient   ? Methods Explanation;Handout   ? Comprehension Verbalized understanding;Returned demonstration   ? ?  ?  ? ?  ? ? ? PT Short Term Goals - 12/20/21 0949   ? ?  ? PT SHORT TERM GOAL #1  ? Title I with basic HEP   ? Baseline HEP updated with neural glides   ? Time 1   ? Status On-going   ? Target Date 12/25/21   ? ?  ?  ? ?  ? ? ? ? PT Long Term Goals - 12/20/21 0950   ? ?  ? PT LONG TERM GOAL #1  ? Title I with final HEP   ? Time 10   ? Period Weeks   ? Status New   ? Target Date 02/05/22   ?  ? PT LONG TERM GOAL #2  ? Title Patient will perform his  normal daily activities with reports of T/N </+3/10 for a full week.   ? Baseline Lower overall, but inconsistently.   ? Time 10   ? Period Weeks   ? Status New   ? Target Date 02/05/22   ?  ? PT LONG TERM GOAL #3  ? Title When T/N flairs, patient will identify 2 techniques which reduce the symptoms to <3/10.   ? Baseline Currently has no treatment options, just sits and rests.   ? Time 10   ? Period Weeks   ? Status New   ? Target Date 02/05/22   ?  ? PT LONG TERM GOAL #4  ? Title Patient will stand with B shoulders in relaxed position, B Up Traps relaxed with no TP.   ? Baseline Currently holds B sholders hiked with tightness and TP in B Up Traps.   ? Time 10   ? Period Weeks   ? Status New   ? Target Date 02/05/22   ? ?  ?  ? ?  ? ? ? ? ? ? ? ? Plan - 12/20/21 1057   ? ? Clinical Impression Statement Patient reports continued foot pain. He has an appointment iwth Dr on [place]. Treatemtn focused on neural tension/glides, STM, stretch. Initiated upper body stregnthening HEP.   ? Personal Factors and Comorbidities Past/Current Experience;Comorbidity 2   ? Comorbidities DM, DDD   ? Examination-Activity Limitations Carry;Reach Overhead   ? Stability/Clinical Decision Making Evolving/Moderate complexity   ? Clinical Decision  Making Moderate   ? Rehab Potential Good   ? PT Frequency 2x / week   ? PT Duration 8 weeks   ? PT Treatment/Interventions ADLs/Self Care Home Management;Iontophoresis 4mg /ml Dexamethasone;Moist Heat;Traction;Electrical Stimulation;Therapeutic exercise;Therapeutic activities;Patient/family education;Manual techniques;Dry needling;Passive range of motion   ? PT Next Visit Plan postural strength   ? PT Home Exercise Plan [facility]   ? Consulted and Agree with Plan of Care Patient   ? ?  ?  ? ?  ? ? ?Patient will benefit from skilled therapeutic intervention in order to improve the following deficits and impairments:  Decreased range of motion, Increased fascial restricitons, Pain, Decreased strength, Impaired sensation, Postural dysfunction, Improper body mechanics, Impaired flexibility ? ?Visit Diagnosis: ?Radiculopathy, cervical region ? ?Muscle weakness (generalized) ? ?Chronic bilateral low back pain with sciatica, sciatica laterality unspecified ? ?Pain in right hip ? ? ? ? ?Problem List ?Patient Active Problem List  ? Diagnosis Date Noted  ? Intermittent claudication ([facility]) 10/31/2021  ? COVID-19 10/03/2021  ? Left knee pain 08/15/2021  ? Lumbar spinal stenosis 03/15/2021  ? Pancreatitis 12/10/2020  ? Cervical radiculopathy 04/18/2020  ? Smoking greater than 20 pack years 04/18/2020  ? Eczema 01/18/2020  ? Colon cancer screening 12/09/2018  ? Essential hypertension 09/09/2018  ? Type 2 diabetes mellitus with hyperglycemia, with long-term current use of insulin (H[facility]) 09/09/2018  ? CAD (coronary artery disease), native coronary artery 09/09/2018  ? History of CVA (cerebrovascular accident) 09/09/2018  ? Mixed hyperlipidemia 02/05/2018  ? ? ?04/07/2018, DPT ?12/20/2021, 11:00 AM ? ?[place] [facility] ?[facility] ?[address] ?[place], [place], [place] ?Phone: [phone]   Fax:  [phone] ? ?Name: Adalbert Alberto ?MRN: [id number] Anger ?Date of Birth: 1956/08/31 ? ? ? ?

## 2021-12-24 NOTE — H&P (View-Only) (Signed)
[facility] [place] ? ?ASSESSMENT / PLAN: ?Erik Rose is a 66 y.o. male with atherosclerosis of native arteries of right lower extremity causing ischemic rest pain. patients with chronic limb threatening ischemia have an annual risk of cardiovascular mortality of 25% and a high risk of amputation.  ? ?Recommend the following which can slow the progression of atherosclerosis and reduce the risk of major adverse cardiac / limb events:  ?Complete cessation from all tobacco products. ?Blood glucose control with goal A1c < 7%. ?Blood pressure control with goal blood pressure < 140/90 mmHg. ?Lipid reduction therapy with goal LDL-C <100 mg/dL (<70 if symptomatic from PAD).  ?Aspirin 81m PO QD.  ?Atorvastatin 40-80mg PO QD (or other "high intensity" statin therapy). ? ?Plan right lower extremity angiogram with possible intervention via left common femoral approach in cath lab 12/28/21.  ? ?CHIEF COMPLAINT: right foot pain ? ?HISTORY OF PRESENT ILLNESS: ?Erik Rose a 66y.o. male referred to clinic for evaluation of peripheral arterial disease.  The patient has a fairly classic history of ischemic rest pain of the right foot.  He says this has been going on for several months.  He reports pain at the ball of his foot which occasionally awakens him from sleep.  He does not have any ulcers on his feet.  He reports some antecedent claudication.  He is a smoker.  He is a diabetic who is trying to improve his control. ? ?VASCULAR SURGICAL HISTORY: none ? ?VASCULAR RISK FACTORS: ?Negative history of stroke / transient ischemic attack. ?Positive history of coronary artery disease. + history of PCI.  ?Positive history of diabetes mellitus. Last A1c 7.5. ?Positive history of smoking. + actively smoking. ?Positive history of hypertension.  ?Negative history of chronic kidney disease.  Last GFR >60.  ?Negative history of chronic obstructive pulmonary disease. ? ?FUNCTIONAL STATUS: ?ECOG  performance status: (0) Fully active, able to carry on all predisease performance without restriction ?Ambulatory status: Ambulatory within the community with limits ? ?Past Medical History:  ?Diagnosis Date  ? [place] complication ([place]   ? Diverticulitis   ? GERD (gastroesophageal reflux disease)   ? Heart disease   ? Hypertension   ? Stroke ([facility]   ? TIA (transient ischemic attack)   ? ? ?Past Surgical History:  ?Procedure Laterality Date  ? CARDIAC CATHETERIZATION    ? CORONARY ANGIOPLASTY WITH STENT PLACEMENT  2010  ? ? ?Family History  ?Problem Relation Age of Onset  ? Hypertension Mother   ? Kidney disease Mother   ? Miscarriages / [place]   ? Stroke Mother   ? Heart disease Father   ? Depression Sister   ? Kidney disease Sister   ? Diabetes Brother   ? Hypertension Brother   ? Hyperlipidemia Brother   ? Kidney disease Brother   ? ? ?Social History  ? ?Socioeconomic History  ? Marital status: Married  ?  Spouse name: Erik Rose ? Number of children: 4  ? Years of education: 154 ? Highest education level: Associate degree: academic program  ?Occupational History  ?  Comment: Disability/retired.  ?Tobacco Use  ? Smoking status: Every Day  ?  Packs/day: 0.50  ?  Types: Cigarettes  ? Smokeless tobacco: Never  ? Tobacco comments:  ?  5-8 cigarettes a day  ?Vaping Use  ? Vaping Use: Never used  ?Substance and Sexual Activity  ? Alcohol use: Not Currently  ?  Comment: quit 30 yrs ago  ?  Drug use: Not Currently  ?  Types: Marijuana, Cocaine  ?  Comment: stopped 1991  ? Sexual activity: Not on file  ?Other Topics Concern  ? Not on file  ?Social History Narrative  ? 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   ? ?Social Determinants of Health  ? ?Financial Resource Strain: Not on file  ?Food Insecurity: Not on file  ?Transportation Needs: Not on file  ?Physical Activity: Not on file  ?Stress: Not on file  ?Social Connections: Not on file  ?Intimate Partner  Violence: Not on file  ? ? ?No Known Allergies ? ?Current Outpatient Medications  ?Medication Sig Dispense Refill  ? amLODipine (NORVASC) 5 MG tablet Take 1 tablet (5 mg total) by mouth daily. 90 tablet 3  ? aspirin EC 81 MG tablet Take 1 tablet (81 mg total) by mouth daily. Swallow whole.    ? atorvastatin (LIPITOR) 80 MG tablet Take 1 tablet (80 mg total) by mouth daily. 90 tablet 3  ? Blood Glucose Monitoring Suppl (ONE TOUCH ULTRA 2) w/Device KIT Use to take blood sugar one to two times daily 1 each 0  ? carvedilol (COREG) 12.5 MG tablet TAKE 1 TABLET(12.5 MG) BY MOUTH TWICE DAILY WITH A MEAL 180 tablet 1  ? Esomeprazole Magnesium (NEXIUM PO) Take by mouth.    ? gabapentin (NEURONTIN) 300 MG capsule One tab PO qHS for a week, then BID for a week, then TID. May double weekly to a max of 3,633m/day 90 capsule 3  ? insulin glargine (LANTUS SOLOSTAR) 100 UNIT/ML Solostar Pen ADMINISTER 55 UNITS UNDER THE SKIN DAILY 51 mL 2  ? insulin lispro (HUMALOG KWIKPEN) 100 UNIT/ML KwikPen INJECT 15 UNITS INTO THE SKIN 3 TIMES DAILY 15 mL 3  ? lisinopril (ZESTRIL) 40 MG tablet Take 1 tablet (40 mg total) by mouth daily. 90 tablet 1  ? meloxicam (MOBIC) 15 MG tablet Take 1 tablet (15 mg total) by mouth daily as needed for pain. Take daily for 10 days and then as needed. 90 tablet 0  ? metFORMIN (GLUCOPHAGE XR) 500 MG 24 hr tablet Take 2 tablets (1,000 mg total) by mouth daily with breakfast. 180 tablet 1  ? ONETOUCH ULTRA test strip USE AS DIRECTED TO TEST BLOOD SUGAR TWICE DAILY 100 strip 1  ? OVER THE COUNTER MEDICATION Take 1 tablet by mouth daily. ALLERGY PILL    ? triamcinolone cream (KENALOG) 0.1 % Apply 1 application topically 2 (two) times daily. 30 g 0  ? ?No current facility-administered medications for this visit.  ? ? ?PHYSICAL EXAM ?Vitals:  ? 12/25/21 1041  ?BP: 139/90  ?Pulse: 90  ?Resp: 20  ?Temp: 99.2 ?F (37.3 ?C)  ?SpO2: 100%  ?Weight: 221 lb (100.2 kg)  ?Height: '5\' 10"'  (1.778 m)  ? ? ?Constitutional:  Well-appearing middle-aged man in no acute distress. ?Cardiac: Regular rate and rhythm.  ?Respiratory: unlabored. ?Abdominal:  soft, non-tender, non-distended.  ?Peripheral vascular: No palpable pedal pulses bilaterally ?Extremity: no edema. no cyanosis. no pallor.  ?Skin: no gangrene. no ulceration.  ?Lymphatic: no Stemmer's sign. no palpable lymphadenopathy. ? ?PERTINENT LABORATORY AND RADIOLOGIC DATA ? ?Most recent CBC ?CBC Latest Ref Rng & Units 11/15/2021 11/18/2020 01/18/2020  ?WBC 3.8 - 10.8 Thousand/uL 9.5 10.4 8.6  ?Hemoglobin 13.2 - 17.1 g/dL 12.6(L) 12.8(L) 14.8  ?Hematocrit 38.5 - 50.0 % 38.3(L) 38.1(L) 44.1  ?Platelets 140 - 400 Thousand/uL 296 226 191  ?  ? ?Most recent  CMP ?CMP Latest Ref Rng & Units 11/15/2021 11/18/2020 01/18/2020  ?Glucose 65 - 99 mg/dL 96 130(H) 113(H)  ?BUN 7 - 25 mg/dL '16 14 11  ' ?Creatinine 0.70 - 1.35 mg/dL 0.88 0.77 0.93  ?Sodium 135 - 146 mmol/L 141 140 139  ?Potassium 3.5 - 5.3 mmol/L 4.3 3.4(L) 3.9  ?Chloride 98 - 110 mmol/L 107 106 106  ?CO2 20 - 32 mmol/L '27 25 26  ' ?Calcium 8.6 - 10.3 mg/dL 9.3 8.9 9.3  ?Total Protein 6.1 - 8.1 g/dL 6.9 7.4 7.2  ?Total Bilirubin 0.2 - 1.2 mg/dL 0.4 0.5 0.6  ?Alkaline Phos 38 - 126 U/L - 92 -  ?AST 10 - 35 U/L '14 22 14  ' ?ALT 9 - 46 U/L '11 22 17  ' ? ? ?Renal function ?CrCl cannot be calculated (Patient's most recent lab result is older than the maximum 21 days allowed.). ? ?HbA1c, POC (controlled diabetic range) (%)  ?Date Value  ?11/15/2021 7.5 (A)  ? ? ?LDL Cholesterol (Calc)  ?Date Value Ref Range Status  ?11/15/2021 60 mg/dL (calc) Final  ?  Comment:  ?  Reference range: <100 ?Marland Kitchen ?Desirable range <100 mg/dL for primary prevention;   ?<70 mg/dL for patients with CHD or diabetic patients  ?with > or = 2 CHD risk factors. ?. ?LDL-C is now calculated using the Martin-Hopkins  ?calculation, which is a validated novel method providing  ?better accuracy than the Friedewald equation in the  ?estimation of LDL-C.  ?Cresenciano Genre et al. Annamaria Helling. 1222;411(46):  2061-2068  ?(http://education.[facility].com/faq/FAQ164) ?  ? ?Direct LDL  ?Date Value Ref Range Status  ?09/09/2018 112.0 mg/dL Final  ?  Comment:  ?  Optimal:  <100 mg/dLNear or Above Optimal:  100-129 mg/dLBorder

## 2021-12-24 NOTE — H&P (View-Only) (Signed)
[facility] [place] ? ?ASSESSMENT / PLAN: ?Luciano Cinquemani is a 66 y.o. male with atherosclerosis of native arteries of right lower extremity causing ischemic rest pain. patients with chronic limb threatening ischemia have an annual risk of cardiovascular mortality of 25% and a high risk of amputation.  ? ?Recommend the following which can slow the progression of atherosclerosis and reduce the risk of major adverse cardiac / limb events:  ?Complete cessation from all tobacco products. ?Blood glucose control with goal A1c < 7%. ?Blood pressure control with goal blood pressure < 140/90 mmHg. ?Lipid reduction therapy with goal LDL-C <100 mg/dL (<70 if symptomatic from PAD).  ?Aspirin 52m PO QD.  ?Atorvastatin 40-80mg PO QD (or other "high intensity" statin therapy). ? ?Plan right lower extremity angiogram with possible intervention via left common femoral approach in cath lab 12/28/21.  ? ?CHIEF COMPLAINT: right foot pain ? ?HISTORY OF PRESENT ILLNESS: ?SShaquel Chavousis a 66y.o. male referred to clinic for evaluation of peripheral arterial disease.  The patient has a fairly classic history of ischemic rest pain of the right foot.  He says this has been going on for several months.  He reports pain at the ball of his foot which occasionally awakens him from sleep.  He does not have any ulcers on his feet.  He reports some antecedent claudication.  He is a smoker.  He is a diabetic who is trying to improve his control. ? ?VASCULAR SURGICAL HISTORY: none ? ?VASCULAR RISK FACTORS: ?Negative history of stroke / transient ischemic attack. ?Positive history of coronary artery disease. + history of PCI.  ?Positive history of diabetes mellitus. Last A1c 7.5. ?Positive history of smoking. + actively smoking. ?Positive history of hypertension.  ?Negative history of chronic kidney disease.  Last GFR >60.  ?Negative history of chronic obstructive pulmonary disease. ? ?FUNCTIONAL STATUS: ?ECOG  performance status: (0) Fully active, able to carry on all predisease performance without restriction ?Ambulatory status: Ambulatory within the community with limits ? ?Past Medical History:  ?Diagnosis Date  ? [place] complication ([place]   ? Diverticulitis   ? GERD (gastroesophageal reflux disease)   ? Heart disease   ? Hypertension   ? Stroke ([facility]   ? TIA (transient ischemic attack)   ? ? ?Past Surgical History:  ?Procedure Laterality Date  ? CARDIAC CATHETERIZATION    ? CORONARY ANGIOPLASTY WITH STENT PLACEMENT  2010  ? ? ?Family History  ?Problem Relation Age of Onset  ? Hypertension Mother   ? Kidney disease Mother   ? Miscarriages / [place]   ? Stroke Mother   ? Heart disease Father   ? Depression Sister   ? Kidney disease Sister   ? Diabetes Brother   ? Hypertension Brother   ? Hyperlipidemia Brother   ? Kidney disease Brother   ? ? ?Social History  ? ?Socioeconomic History  ? Marital status: Married  ?  Spouse name: LVaughan Basta ? Number of children: 4  ? Years of education: 133 ? Highest education level: Associate degree: academic program  ?Occupational History  ?  Comment: Disability/retired.  ?Tobacco Use  ? Smoking status: Every Day  ?  Packs/day: 0.50  ?  Types: Cigarettes  ? Smokeless tobacco: Never  ? Tobacco comments:  ?  5-8 cigarettes a day  ?Vaping Use  ? Vaping Use: Never used  ?Substance and Sexual Activity  ? Alcohol use: Not Currently  ?  Comment: quit 30 yrs ago  ?  Drug use: Not Currently  ?  Types: Marijuana, Cocaine  ?  Comment: stopped 1991  ? Sexual activity: Not on file  ?Other Topics Concern  ? Not on file  ?Social History Narrative  ? 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   ? ?Social Determinants of Health  ? ?Financial Resource Strain: Not on file  ?Food Insecurity: Not on file  ?Transportation Needs: Not on file  ?Physical Activity: Not on file  ?Stress: Not on file  ?Social Connections: Not on file  ?Intimate Partner  Violence: Not on file  ? ? ?No Known Allergies ? ?Current Outpatient Medications  ?Medication Sig Dispense Refill  ? amLODipine (NORVASC) 5 MG tablet Take 1 tablet (5 mg total) by mouth daily. 90 tablet 3  ? aspirin EC 81 MG tablet Take 1 tablet (81 mg total) by mouth daily. Swallow whole.    ? atorvastatin (LIPITOR) 80 MG tablet Take 1 tablet (80 mg total) by mouth daily. 90 tablet 3  ? Blood Glucose Monitoring Suppl (ONE TOUCH ULTRA 2) w/Device KIT Use to take blood sugar one to two times daily 1 each 0  ? carvedilol (COREG) 12.5 MG tablet TAKE 1 TABLET(12.5 MG) BY MOUTH TWICE DAILY WITH A MEAL 180 tablet 1  ? Esomeprazole Magnesium (NEXIUM PO) Take by mouth.    ? gabapentin (NEURONTIN) 300 MG capsule One tab PO qHS for a week, then BID for a week, then TID. May double weekly to a max of 3,656m/day 90 capsule 3  ? insulin glargine (LANTUS SOLOSTAR) 100 UNIT/ML Solostar Pen ADMINISTER 55 UNITS UNDER THE SKIN DAILY 51 mL 2  ? insulin lispro (HUMALOG KWIKPEN) 100 UNIT/ML KwikPen INJECT 15 UNITS INTO THE SKIN 3 TIMES DAILY 15 mL 3  ? lisinopril (ZESTRIL) 40 MG tablet Take 1 tablet (40 mg total) by mouth daily. 90 tablet 1  ? meloxicam (MOBIC) 15 MG tablet Take 1 tablet (15 mg total) by mouth daily as needed for pain. Take daily for 10 days and then as needed. 90 tablet 0  ? metFORMIN (GLUCOPHAGE XR) 500 MG 24 hr tablet Take 2 tablets (1,000 mg total) by mouth daily with breakfast. 180 tablet 1  ? ONETOUCH ULTRA test strip USE AS DIRECTED TO TEST BLOOD SUGAR TWICE DAILY 100 strip 1  ? OVER THE COUNTER MEDICATION Take 1 tablet by mouth daily. ALLERGY PILL    ? triamcinolone cream (KENALOG) 0.1 % Apply 1 application topically 2 (two) times daily. 30 g 0  ? ?No current facility-administered medications for this visit.  ? ? ?PHYSICAL EXAM ?Vitals:  ? 12/25/21 1041  ?BP: 139/90  ?Pulse: 90  ?Resp: 20  ?Temp: 99.2 ?F (37.3 ?C)  ?SpO2: 100%  ?Weight: 221 lb (100.2 kg)  ?Height: '5\' 10"'  (1.778 m)  ? ? ?Constitutional:  Well-appearing middle-aged man in no acute distress. ?Cardiac: Regular rate and rhythm.  ?Respiratory: unlabored. ?Abdominal:  soft, non-tender, non-distended.  ?Peripheral vascular: No palpable pedal pulses bilaterally ?Extremity: no edema. no cyanosis. no pallor.  ?Skin: no gangrene. no ulceration.  ?Lymphatic: no Stemmer's sign. no palpable lymphadenopathy. ? ?PERTINENT LABORATORY AND RADIOLOGIC DATA ? ?Most recent CBC ?CBC Latest Ref Rng & Units 11/15/2021 11/18/2020 01/18/2020  ?WBC 3.8 - 10.8 Thousand/uL 9.5 10.4 8.6  ?Hemoglobin 13.2 - 17.1 g/dL 12.6(L) 12.8(L) 14.8  ?Hematocrit 38.5 - 50.0 % 38.3(L) 38.1(L) 44.1  ?Platelets 140 - 400 Thousand/uL 296 226 191  ?  ? ?Most recent  CMP ?CMP Latest Ref Rng & Units 11/15/2021 11/18/2020 01/18/2020  ?Glucose 65 - 99 mg/dL 96 130(H) 113(H)  ?BUN 7 - 25 mg/dL '16 14 11  ' ?Creatinine 0.70 - 1.35 mg/dL 0.88 0.77 0.93  ?Sodium 135 - 146 mmol/L 141 140 139  ?Potassium 3.5 - 5.3 mmol/L 4.3 3.4(L) 3.9  ?Chloride 98 - 110 mmol/L 107 106 106  ?CO2 20 - 32 mmol/L '27 25 26  ' ?Calcium 8.6 - 10.3 mg/dL 9.3 8.9 9.3  ?Total Protein 6.1 - 8.1 g/dL 6.9 7.4 7.2  ?Total Bilirubin 0.2 - 1.2 mg/dL 0.4 0.5 0.6  ?Alkaline Phos 38 - 126 U/L - 92 -  ?AST 10 - 35 U/L '14 22 14  ' ?ALT 9 - 46 U/L '11 22 17  ' ? ? ?Renal function ?CrCl cannot be calculated (Patient's most recent lab result is older than the maximum 21 days allowed.). ? ?HbA1c, POC (controlled diabetic range) (%)  ?Date Value  ?11/15/2021 7.5 (A)  ? ? ?LDL Cholesterol (Calc)  ?Date Value Ref Range Status  ?11/15/2021 60 mg/dL (calc) Final  ?  Comment:  ?  Reference range: <100 ?Marland Kitchen ?Desirable range <100 mg/dL for primary prevention;   ?<70 mg/dL for patients with CHD or diabetic patients  ?with > or = 2 CHD risk factors. ?. ?LDL-C is now calculated using the Martin-Hopkins  ?calculation, which is a validated novel method providing  ?better accuracy than the Friedewald equation in the  ?estimation of LDL-C.  ?Cresenciano Genre et al. Annamaria Helling. 1071;252(47):  2061-2068  ?(http://education.[facility].com/faq/FAQ164) ?  ? ?Direct LDL  ?Date Value Ref Range Status  ?09/09/2018 112.0 mg/dL Final  ?  Comment:  ?  Optimal:  <100 mg/dLNear or Above Optimal:  100-129 mg/dLBorder

## 2021-12-24 NOTE — Progress Notes (Signed)
[facility] [place] ? ?ASSESSMENT / PLAN: ?Erik Rose is a 66 y.o. male with atherosclerosis of native arteries of right lower extremity causing ischemic rest pain. patients with chronic limb threatening ischemia have an annual risk of cardiovascular mortality of 25% and a high risk of amputation.  ? ?Recommend the following which can slow the progression of atherosclerosis and reduce the risk of major adverse cardiac / limb events:  ?Complete cessation from all tobacco products. ?Blood glucose control with goal A1c < 7%. ?Blood pressure control with goal blood pressure < 140/90 mmHg. ?Lipid reduction therapy with goal LDL-C <100 mg/dL (<70 if symptomatic from PAD).  ?Aspirin 73m PO QD.  ?Atorvastatin 40-80mg PO QD (or other "high intensity" statin therapy). ? ?Plan right lower extremity angiogram with possible intervention via left common femoral approach in cath lab 12/28/21.  ? ?CHIEF COMPLAINT: right foot pain ? ?HISTORY OF PRESENT ILLNESS: ?Erik Barnabyis a 66y.o. male referred to clinic for evaluation of peripheral arterial disease.  The patient has a fairly classic history of ischemic rest pain of the right foot.  He says this has been going on for several months.  He reports pain at the ball of his foot which occasionally awakens him from sleep.  He does not have any ulcers on his feet.  He reports some antecedent claudication.  He is a smoker.  He is a diabetic who is trying to improve his control. ? ?VASCULAR SURGICAL HISTORY: none ? ?VASCULAR RISK FACTORS: ?Negative history of stroke / transient ischemic attack. ?Positive history of coronary artery disease. + history of PCI.  ?Positive history of diabetes mellitus. Last A1c 7.5. ?Positive history of smoking. + actively smoking. ?Positive history of hypertension.  ?Negative history of chronic kidney disease.  Last GFR >60.  ?Negative history of chronic obstructive pulmonary disease. ? ?FUNCTIONAL STATUS: ?ECOG  performance status: (0) Fully active, able to carry on all predisease performance without restriction ?Ambulatory status: Ambulatory within the community with limits ? ?Past Medical History:  ?Diagnosis Date  ? [place] without complication ([place]   ? Diverticulitis   ? GERD (gastroesophageal reflux disease)   ? Heart disease   ? Hypertension   ? Stroke ([facility]   ? TIA (transient ischemic attack)   ? ? ?Past Surgical History:  ?Procedure Laterality Date  ? CARDIAC CATHETERIZATION    ? CORONARY ANGIOPLASTY WITH STENT PLACEMENT  2010  ? ? ?Family History  ?Problem Relation Age of Onset  ? Hypertension Mother   ? Kidney disease Mother   ? Miscarriages / [place]   ? Stroke Mother   ? Heart disease Father   ? Depression Sister   ? Kidney disease Sister   ? Diabetes Brother   ? Hypertension Brother   ? Hyperlipidemia Brother   ? Kidney disease Brother   ? ? ?Social History  ? ?Socioeconomic History  ? Marital status: Married  ?  Spouse name: LVaughan Basta ? Number of children: 4  ? Years of education: 123 ? Highest education level: Associate degree: academic program  ?Occupational History  ?  Comment: Disability/retired.  ?Tobacco Use  ? Smoking status: Every Day  ?  Packs/day: 0.50  ?  Types: Cigarettes  ? Smokeless tobacco: Never  ? Tobacco comments:  ?  5-8 cigarettes a day  ?Vaping Use  ? Vaping Use: Never used  ?Substance and Sexual Activity  ? Alcohol use: Not Currently  ?  Comment: quit 30 yrs ago  ?  Drug use: Not Currently  ?  Types: Marijuana, Cocaine  ?  Comment: stopped 1991  ? Sexual activity: Not on file  ?Other Topics Concern  ? Not on file  ?Social History Narrative  ? 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   ? ?Social Determinants of Health  ? ?Financial Resource Strain: Not on file  ?Food Insecurity: Not on file  ?Transportation Needs: Not on file  ?Physical Activity: Not on file  ?Stress: Not on file  ?Social Connections: Not on file  ?Intimate Partner  Violence: Not on file  ? ? ?No Known Allergies ? ?Current Outpatient Medications  ?Medication Sig Dispense Refill  ? amLODipine (NORVASC) 5 MG tablet Take 1 tablet (5 mg total) by mouth daily. 90 tablet 3  ? aspirin EC 81 MG tablet Take 1 tablet (81 mg total) by mouth daily. Swallow whole.    ? atorvastatin (LIPITOR) 80 MG tablet Take 1 tablet (80 mg total) by mouth daily. 90 tablet 3  ? Blood Glucose Monitoring Suppl (ONE TOUCH ULTRA 2) w/Device KIT Use to take blood sugar one to two times daily 1 each 0  ? carvedilol (COREG) 12.5 MG tablet TAKE 1 TABLET(12.5 MG) BY MOUTH TWICE DAILY WITH A MEAL 180 tablet 1  ? Esomeprazole Magnesium (NEXIUM PO) Take by mouth.    ? gabapentin (NEURONTIN) 300 MG capsule One tab PO qHS for a week, then BID for a week, then TID. May double weekly to a max of 3,664m/day 90 capsule 3  ? insulin glargine (LANTUS SOLOSTAR) 100 UNIT/ML Solostar Pen ADMINISTER 55 UNITS UNDER THE SKIN DAILY 51 mL 2  ? insulin lispro (HUMALOG KWIKPEN) 100 UNIT/ML KwikPen INJECT 15 UNITS INTO THE SKIN 3 TIMES DAILY 15 mL 3  ? lisinopril (ZESTRIL) 40 MG tablet Take 1 tablet (40 mg total) by mouth daily. 90 tablet 1  ? meloxicam (MOBIC) 15 MG tablet Take 1 tablet (15 mg total) by mouth daily as needed for pain. Take daily for 10 days and then as needed. 90 tablet 0  ? metFORMIN (GLUCOPHAGE XR) 500 MG 24 hr tablet Take 2 tablets (1,000 mg total) by mouth daily with breakfast. 180 tablet 1  ? ONETOUCH ULTRA test strip USE AS DIRECTED TO TEST BLOOD SUGAR TWICE DAILY 100 strip 1  ? OVER THE COUNTER MEDICATION Take 1 tablet by mouth daily. ALLERGY PILL    ? triamcinolone cream (KENALOG) 0.1 % Apply 1 application topically 2 (two) times daily. 30 g 0  ? ?No current facility-administered medications for this visit.  ? ? ?PHYSICAL EXAM ?Vitals:  ? 12/25/21 1041  ?BP: 139/90  ?Pulse: 90  ?Resp: 20  ?Temp: 99.2 ?F (37.3 ?C)  ?SpO2: 100%  ?Weight: 221 lb (100.2 kg)  ?Height: '5\' 10"'  (1.778 m)  ? ? ?Constitutional:  Well-appearing middle-aged man in no acute distress. ?Cardiac: Regular rate and rhythm.  ?Respiratory: unlabored. ?Abdominal:  soft, non-tender, non-distended.  ?Peripheral vascular: No palpable pedal pulses bilaterally ?Extremity: no edema. no cyanosis. no pallor.  ?Skin: no gangrene. no ulceration.  ?Lymphatic: no Stemmer's sign. no palpable lymphadenopathy. ? ?PERTINENT LABORATORY AND RADIOLOGIC DATA ? ?Most recent CBC ?CBC Latest Ref Rng & Units 11/15/2021 11/18/2020 01/18/2020  ?WBC 3.8 - 10.8 Thousand/uL 9.5 10.4 8.6  ?Hemoglobin 13.2 - 17.1 g/dL 12.6(L) 12.8(L) 14.8  ?Hematocrit 38.5 - 50.0 % 38.3(L) 38.1(L) 44.1  ?Platelets 140 - 400 Thousand/uL 296 226 191  ?  ? ?Most recent  CMP ?CMP Latest Ref Rng & Units 11/15/2021 11/18/2020 01/18/2020  ?Glucose 65 - 99 mg/dL 96 130(H) 113(H)  ?BUN 7 - 25 mg/dL '16 14 11  ' ?Creatinine 0.70 - 1.35 mg/dL 0.88 0.77 0.93  ?Sodium 135 - 146 mmol/L 141 140 139  ?Potassium 3.5 - 5.3 mmol/L 4.3 3.4(L) 3.9  ?Chloride 98 - 110 mmol/L 107 106 106  ?CO2 20 - 32 mmol/L '27 25 26  ' ?Calcium 8.6 - 10.3 mg/dL 9.3 8.9 9.3  ?Total Protein 6.1 - 8.1 g/dL 6.9 7.4 7.2  ?Total Bilirubin 0.2 - 1.2 mg/dL 0.4 0.5 0.6  ?Alkaline Phos 38 - 126 U/L - 92 -  ?AST 10 - 35 U/L '14 22 14  ' ?ALT 9 - 46 U/L '11 22 17  ' ? ? ?Renal function ?CrCl cannot be calculated (Patient's most recent lab result is older than the maximum 21 days allowed.). ? ?HbA1c, POC (controlled diabetic range) (%)  ?Date Value  ?11/15/2021 7.5 (A)  ? ? ?LDL Cholesterol (Calc)  ?Date Value Ref Range Status  ?11/15/2021 60 mg/dL (calc) Final  ?  Comment:  ?  Reference range: <100 ?Marland Kitchen ?Desirable range <100 mg/dL for primary prevention;   ?<70 mg/dL for patients with CHD or diabetic patients  ?with > or = 2 CHD risk factors. ?. ?LDL-C is now calculated using the Martin-Hopkins  ?calculation, which is a validated novel method providing  ?better accuracy than the Friedewald equation in the  ?estimation of LDL-C.  ?Cresenciano Genre et al. Annamaria Helling. 3005;110(21):  2061-2068  ?(http://education.[employer].com/faq/FAQ164) ?  ? ?Direct LDL  ?Date Value Ref Range Status  ?09/09/2018 112.0 mg/dL Final  ?  Comment:  ?  Optimal:  <100 mg/dLNear or Above Optimal:  100-129 mg/dLBorder

## 2021-12-25 ENCOUNTER — Encounter: Payer: Self-pay | Admitting: Vascular Surgery

## 2021-12-25 ENCOUNTER — Other Ambulatory Visit: Payer: Self-pay

## 2021-12-25 ENCOUNTER — Ambulatory Visit: Payer: Self-pay | Admitting: Vascular Surgery

## 2021-12-25 VITALS — BP 139/90 | HR 90 | Temp 99.2°F | Resp 20 | Ht 70.0 in | Wt 221.0 lb

## 2021-12-25 DIAGNOSIS — I70221 Atherosclerosis of native arteries of extremities with rest pain, right leg: Secondary | ICD-10-CM

## 2021-12-28 ENCOUNTER — Other Ambulatory Visit: Payer: Self-pay

## 2021-12-28 ENCOUNTER — Encounter (HOSPITAL_COMMUNITY)
Admission: RE | Disposition: A | Discharge status: Home / Self Care | Payer: Self-pay | Source: Home / Self Care | Attending: Vascular Surgery

## 2021-12-28 ENCOUNTER — Encounter (HOSPITAL_COMMUNITY): Payer: Self-pay | Admitting: Vascular Surgery

## 2021-12-28 ENCOUNTER — Ambulatory Visit (HOSPITAL_BASED_OUTPATIENT_CLINIC_OR_DEPARTMENT_OTHER): Payer: Medicare PPO

## 2021-12-28 ENCOUNTER — Ambulatory Visit (HOSPITAL_COMMUNITY)
Admission: RE | Admit: 2021-12-28 | Discharge: 2021-12-28 | Disposition: A | Discharge status: Home / Self Care | Payer: Medicare PPO | Attending: Vascular Surgery | Admitting: Vascular Surgery

## 2021-12-28 DIAGNOSIS — I739 Peripheral vascular disease, unspecified: Secondary | ICD-10-CM | POA: Diagnosis not present

## 2021-12-28 DIAGNOSIS — F17210 Nicotine dependence, cigarettes, uncomplicated: Secondary | ICD-10-CM | POA: Insufficient documentation

## 2021-12-28 DIAGNOSIS — Z7984 Long term (current) use of oral hypoglycemic drugs: Secondary | ICD-10-CM | POA: Diagnosis not present

## 2021-12-28 DIAGNOSIS — I2510 Atherosclerotic heart disease of native coronary artery without angina pectoris: Secondary | ICD-10-CM | POA: Diagnosis not present

## 2021-12-28 DIAGNOSIS — Z01810 Encounter for preprocedural cardiovascular examination: Secondary | ICD-10-CM | POA: Diagnosis not present

## 2021-12-28 DIAGNOSIS — I70221 Atherosclerosis of native arteries of extremities with rest pain, right leg: Secondary | ICD-10-CM | POA: Diagnosis not present

## 2021-12-28 DIAGNOSIS — I10 Essential (primary) hypertension: Secondary | ICD-10-CM | POA: Insufficient documentation

## 2021-12-28 DIAGNOSIS — Z794 Long term (current) use of insulin: Secondary | ICD-10-CM | POA: Insufficient documentation

## 2021-12-28 DIAGNOSIS — Z8673 Personal history of transient ischemic attack (TIA), and cerebral infarction without residual deficits: Secondary | ICD-10-CM | POA: Insufficient documentation

## 2021-12-28 DIAGNOSIS — Z7982 Long term (current) use of aspirin: Secondary | ICD-10-CM | POA: Insufficient documentation

## 2021-12-28 DIAGNOSIS — E1151 Type 2 diabetes mellitus with diabetic peripheral angiopathy without gangrene: Secondary | ICD-10-CM | POA: Insufficient documentation

## 2021-12-28 DIAGNOSIS — Z79899 Other long term (current) drug therapy: Secondary | ICD-10-CM | POA: Insufficient documentation

## 2021-12-28 HISTORY — PX: ABDOMINAL AORTOGRAM W/LOWER EXTREMITY: CATH118223

## 2021-12-28 LAB — POCT I-STAT, CHEM 8
BUN: 15 mg/dL (ref 8–23)
Calcium, Ion: 1.25 mmol/L (ref 1.15–1.40)
Chloride: 106 mmol/L (ref 98–111)
Creatinine, Ser: 0.9 mg/dL (ref 0.61–1.24)
Glucose, Bld: 157 mg/dL — ABNORMAL HIGH (ref 70–99)
HCT: 36 % — ABNORMAL LOW (ref 39.0–52.0)
Hemoglobin: 12.2 g/dL — ABNORMAL LOW (ref 13.0–17.0)
Potassium: 3.8 mmol/L (ref 3.5–5.1)
Sodium: 142 mmol/L (ref 135–145)
TCO2: 26 mmol/L (ref 22–32)

## 2021-12-28 LAB — GLUCOSE, CAPILLARY: Glucose-Capillary: 142 mg/dL — ABNORMAL HIGH (ref 70–99)

## 2021-12-28 SURGERY — ABDOMINAL AORTOGRAM W/LOWER EXTREMITY
Anesthesia: LOCAL

## 2021-12-28 MED ORDER — MIDAZOLAM HCL 2 MG/2ML IJ SOLN
INTRAMUSCULAR | Status: AC
  Filled 2021-12-28: qty 2

## 2021-12-28 MED ORDER — SODIUM CHLORIDE 0.9 % IV SOLN: 250.0000 mL | INTRAVENOUS | Status: DC | PRN

## 2021-12-28 MED ORDER — HEPARIN (PORCINE) IN NACL 1000-0.9 UT/500ML-% IV SOLN
INTRAVENOUS | Status: DC | PRN
  Administered 2021-12-28 (×2): 500 mL

## 2021-12-28 MED ORDER — HEPARIN (PORCINE) IN NACL 1000-0.9 UT/500ML-% IV SOLN
INTRAVENOUS | Status: AC
  Filled 2021-12-28: qty 1000

## 2021-12-28 MED ORDER — IODIXANOL 320 MG/ML IV SOLN
INTRAVENOUS | Status: DC | PRN
  Administered 2021-12-28: 100 mL via INTRA_ARTERIAL

## 2021-12-28 MED ORDER — SODIUM CHLORIDE 0.9 % WEIGHT BASED INFUSION: 1.0000 mL/kg/h | INTRAVENOUS | Status: DC

## 2021-12-28 MED ORDER — SODIUM CHLORIDE 0.9 % IV SOLN: INTRAVENOUS | Status: DC

## 2021-12-28 MED ORDER — FENTANYL CITRATE (PF) 100 MCG/2ML IJ SOLN
INTRAMUSCULAR | Status: DC | PRN
  Administered 2021-12-28: 50 ug via INTRAVENOUS

## 2021-12-28 MED ORDER — LABETALOL HCL 5 MG/ML IV SOLN: 10.0000 mg | INTRAVENOUS | Status: DC | PRN

## 2021-12-28 MED ORDER — ONDANSETRON HCL 4 MG/2ML IJ SOLN: 4.0000 mg | Freq: Four times a day (QID) | INTRAMUSCULAR | Status: DC | PRN

## 2021-12-28 MED ORDER — HYDRALAZINE HCL 20 MG/ML IJ SOLN: 5.0000 mg | INTRAMUSCULAR | Status: DC | PRN

## 2021-12-28 MED ORDER — MIDAZOLAM HCL 2 MG/2ML IJ SOLN
INTRAMUSCULAR | Status: DC | PRN
Start: 2021-12-28 — End: 2021-12-28
  Administered 2021-12-28: 2 mg via INTRAVENOUS

## 2021-12-28 MED ORDER — SODIUM CHLORIDE 0.9% FLUSH: 3.0000 mL | INTRAVENOUS | Status: DC | PRN

## 2021-12-28 MED ORDER — ACETAMINOPHEN 325 MG PO TABS: 650.0000 mg | ORAL_TABLET | ORAL | Status: DC | PRN

## 2021-12-28 MED ORDER — LIDOCAINE HCL (PF) 1 % IJ SOLN
INTRAMUSCULAR | Status: AC
  Filled 2021-12-28: qty 30

## 2021-12-28 MED ORDER — SODIUM CHLORIDE 0.9% FLUSH: 3.0000 mL | Freq: Two times a day (BID) | INTRAVENOUS | Status: DC

## 2021-12-28 MED ORDER — FENTANYL CITRATE (PF) 100 MCG/2ML IJ SOLN
INTRAMUSCULAR | Status: AC
  Filled 2021-12-28: qty 2

## 2021-12-28 MED ORDER — LIDOCAINE HCL (PF) 1 % IJ SOLN
INTRAMUSCULAR | Status: DC | PRN
  Administered 2021-12-28: 20 mL via INTRADERMAL

## 2021-12-28 SURGICAL SUPPLY — 10 items
CATH OMNI FLUSH 5F 65CM (CATHETERS) ×1 IMPLANT
KIT MICROPUNCTURE NIT STIFF (SHEATH) ×1 IMPLANT
KIT PV (KITS) ×2 IMPLANT
SHEATH PINNACLE 5F 10CM (SHEATH) ×1 IMPLANT
STOPCOCK MORSE 400PSI 3WAY (MISCELLANEOUS) ×1 IMPLANT
SYR MEDRAD MARK 7 150ML (SYRINGE) ×2 IMPLANT
TRANSDUCER W/STOPCOCK (MISCELLANEOUS) ×2 IMPLANT
TRAY PV CATH (CUSTOM PROCEDURE TRAY) ×2 IMPLANT
TUBING CIL FLEX 10 FLL-RA (TUBING) ×1 IMPLANT
WIRE BENTSON .035X145CM (WIRE) ×1 IMPLANT

## 2021-12-28 NOTE — Progress Notes (Signed)
Duplex of saphenous veins personally reviewed. ?High quality saphenous vein on right. ?Will plan on in-situ R CFA - R peroneal bypass on Wednesday 01/02/22. ? ?Rande Brunt. Lenell Antu, MD ?[facility] [place] ?Office Phone Number: [phone] ?12/28/2021 12:13 PM ? ?

## 2021-12-28 NOTE — Discharge Instructions (Signed)
Femoral Site Care This sheet gives you information about how to care for yourself after your procedure. Your health care provider may also give you more specific instructions. If you have problems or questions, contact your health care provider. What can I expect after the procedure?  After the procedure, it is common to have: Bruising that usually fades within 1-2 weeks. Tenderness at the site. Follow these instructions at home: Wound care Follow instructions from your health care provider about how to take care of your insertion site. Make sure you: Wash your hands with soap and water before you change your bandage (dressing). If soap and water are not available, use hand sanitizer. Remove your dressing as told by your health care provider. In 24 hours Do not take baths, swim, or use a hot tub until your health care provider approves. You may shower 24-48 hours after the procedure or as told by your health care provider. Gently wash the site with plain soap and water. Pat the area dry with a clean towel. Do not rub the site. This may cause bleeding. Do not apply powder or lotion to the site. Keep the site clean and dry. Check your femoral site every day for signs of infection. Check for: Redness, swelling, or pain. Fluid or blood. Warmth. Pus or a bad smell. Activity For the first 2-3 days after your procedure, or as long as directed: Avoid climbing stairs as much as possible. Do not squat. Do not lift anything that is heavier than 10 lb (4.5 kg), or the limit that you are told, until your health care provider says that it is safe. For 5 days Rest as directed. Avoid sitting for a long time without moving. Get up to take short walks every 1-2 hours. Do not drive for 24 hours if you were given a medicine to help you relax (sedative). General instructions Take over-the-counter and prescription medicines only as told by your health care provider. Keep all follow-up visits as told by  your health care provider. This is important. Contact a health care provider if you have: A fever or chills. You have redness, swelling, or pain around your insertion site. Get help right away if: The catheter insertion area swells very fast. You pass out. You suddenly start to sweat or your skin gets clammy. The catheter insertion area is bleeding, and the bleeding does not stop when you hold steady pressure on the area. The area near or just beyond the catheter insertion site becomes pale, cool, tingly, or numb. These symptoms may represent a serious problem that is an emergency. Do not wait to see if the symptoms will go away. Get medical help right away. Call your local emergency services (911 in the [place] Do not drive yourself to the hospital. Summary After the procedure, it is common to have bruising that usually fades within 1-2 weeks. Check your femoral site every day for signs of infection. Do not lift anything that is heavier than 10 lb (4.5 kg), or the limit that you are told, until your health care provider says that it is safe. This information is not intended to replace advice given to you by your health care provider. Make sure you discuss any questions you have with your health care provider. Document Revised: 10/06/2017 Document Reviewed: 10/06/2017 Elsevier Patient Education  2020 Elsevier Inc. 

## 2021-12-28 NOTE — Progress Notes (Signed)
Site area: Right groin a 5 french arterial sheath was removed ? ?Site Prior to Removal:  Level 0 ? ?Pressure Applied For 20 MINUTES   ? ?Minutes Beginning at 0845am X 4 hours ? ?Manual:   Yes.   ? ?Patient Status During Pull:  stable ? ?Post Pull Groin Site:  Level 0 ? ?Post Pull Instructions Given:  Yes.   ? ?Post Pull Pulses Present:  Yes.   ? ?Dressing Applied:  Yes.   ? ?Comments:    ?

## 2021-12-28 NOTE — Interval H&P Note (Signed)
History and Physical Interval Note: ? ?12/28/2021 ?7:22 AM ? ?[name]  has presented today for surgery, with the diagnosis of pad.  The various methods of treatment have been discussed with the patient and family. After consideration of risks, benefits and other options for treatment, the patient has consented to  Procedure(s): ?ABDOMINAL AORTOGRAM W/LOWER EXTREMITY (N/A) as a surgical intervention.  The patient's history has been reviewed, patient examined, no change in status, stable for surgery.  I have reviewed the patient's chart and labs.  Questions were answered to the patient's satisfaction.   ? ? ?Erik Rose ? ? ?

## 2021-12-28 NOTE — Progress Notes (Signed)
Lower extremity vein mapping study completed.   Please see CV Proc for preliminary results.   Shaun Runyon, RDMS, RVT  

## 2021-12-28 NOTE — Op Note (Signed)
DATE OF SERVICE: 12/28/2021 ? ?PATIENT:  Erik Rose  66 y.o. male ? ?PRE-OPERATIVE DIAGNOSIS:  Atherosclerosis of native arteries of right lower extremity causing ischemic rest pain ? ?POST-OPERATIVE DIAGNOSIS:  Same ? ?PROCEDURE:   ?1) US guided left common femoral artery access ?2) Aortogram ?3) right lower extremity angiogram with second order cannulation ( total contrast) ?4) Conscious sedation (35 minutes) ? ?SURGEON:  Rande Brunt. Lenell Antu, MD ? ?ASSISTANT: none ? ?ANESTHESIA:   local and IV sedation ? ?ESTIMATED BLOOD LOSS: minimal ? ?LOCAL MEDICATIONS USED:  LIDOCAINE  ? ?COUNTS: confirmed correct. ? ?PATIENT DISPOSITION:  PACU - hemodynamically stable. ?  ?Delay start of Pharmacological VTE agent (>24hrs) due to surgical blood loss or risk of bleeding: no ? ?INDICATION FOR PROCEDURE: Erik Rose is a 66 y.o. male with ischemic rest pain of the right lower extremity. After careful discussion of risks, benefits, and alternatives the patient was offered angiography with possible intervention. The patient understood and wished to proceed. ? ?OPERATIVE FINDINGS:  ?Terminal aorta and iliac arteries: ?Mild atherosclerotic disease; no flow limiting stenosis ? ?Right lower extremity: ?Common femoral artery: Mild atherosclerotic disease; no flow limiting stenosis  ?Profunda femoris artery: Mild atherosclerotic disease; no flow limiting stenosis  ?Superficial femoral artery: Mild atherosclerotic disease; no flow limiting stenosis ?Popliteal artery: occluded behind and below the knee.  ?Anterior tibial artery: occluded at its origin. A small island reconstitutes in distal ankle. ?Tibioperoneal trunk: patent, but with some disease ?Peroneal artery: dominant artery; small proximally; enlarges distally near the ankle ?Posterior tibial artery: occluded ?Pedal circulation: fills via peroneal artery ? ?GLASS score. Grade 3: expect 53% 1 year re-intervention rate; expect 69% 5 year re-intervention rate; expect  49% 5 year mortality; expect 61% rate of restenosis from open operation at 5 years; expect 83% rate of restenosis from endovascular procedure.  ? ?DESCRIPTION OF PROCEDURE: After identification of the patient in the pre-operative holding area, the patient was transferred to the operating room. The patient was positioned supine on the operating room table. Anesthesia was induced. The groins was prepped and draped in standard fashion. A surgical pause was performed confirming correct patient, procedure, and operative location. ? ?The left groin was anesthetized with subcutaneous injection of 1% lidocaine. Using ultrasound guidance, the left common femoral artery was accessed with micropuncture technique. Fluoroscopy was used to confirm cannulation over the femoral head. The 109F sheath was upsized to 23F.  ? ?A Benson wire was advanced into the distal aorta. Over the wire an omni flush catheter was advanced to the level of L2. Aortogram was performed - see above for details.  ? ?The right common iliac artery was selected with a Benson guidewire. The wire was advanced into the common femoral artery. Over the wire the omni flush catheter was advanced into the external iliac artery. Selective angiography was performed - see above for details.  ? ?The sheath was left in place to be removed in the recovery area. ? ?Conscious sedation was administered with the use of IV fentanyl and midazolam under continuous physician and nurse monitoring.  Heart rate, blood pressure, and oxygen saturation were continuously monitored.  Total sedation time was 35 minutes ? ?Upon completion of the case instrument and sharps counts were confirmed correct. The patient was transferred to the PACU in good condition. I was present for all portions of the procedure. ? ?PLAN: ASA 81mg  PO QD. High intensity statin therapy. Check vein mapping prior to discharge. Plan right femoral-peroneal bypass next week.  ? ?  Mechele Claude, MD ?[facility] [place] ?Office Phone Number: [phone] ?12/28/2021 8:08 AM ? ?

## 2021-12-31 LAB — POCT I-STAT, CHEM 8
BUN: 21 mg/dL (ref 8–23)
Calcium, Ion: 1.15 mmol/L (ref 1.15–1.40)
Chloride: 108 mmol/L (ref 98–111)
Creatinine, Ser: 0.9 mg/dL (ref 0.61–1.24)
Glucose, Bld: 158 mg/dL — ABNORMAL HIGH (ref 70–99)
HCT: 36 % — ABNORMAL LOW (ref 39.0–52.0)
Hemoglobin: 12.2 g/dL — ABNORMAL LOW (ref 13.0–17.0)
Potassium: 6.8 mmol/L (ref 3.5–5.1)
Sodium: 139 mmol/L (ref 135–145)
TCO2: 26 mmol/L (ref 22–32)

## 2022-01-01 ENCOUNTER — Encounter (HOSPITAL_COMMUNITY): Payer: Self-pay | Admitting: Vascular Surgery

## 2022-01-01 ENCOUNTER — Other Ambulatory Visit: Payer: Self-pay

## 2022-01-01 NOTE — Progress Notes (Signed)
Anesthesia Chart Review: ?Same-day work-up ? ?Follows with cardiologist Dr. Shari Prows for history of HTN, CAD s/p possible PCI in 2010.  Patient reported that in 2010 when he was living in [place], he was seen at the ED for chest discomfort and ultimately underwent PCI x1.  Details are unclear.  He was on Plavix for some time but it was ultimately discontinued after recurrent diverticular bleeding.  He has denied recurrent anginal symptoms.  TTE 09/2020 showed EF 50 to 55%, no regional WMA, no significant valvular disease.  Patient was last seen by Dr. Shari Prows 03/12/2021 and at that time reportedly feeling "great".  He was noted to be active without exertional symptoms.  No changes made to medical management.  Advised to follow-up in 8 months. ? 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 ? ?Patient will need day of surgery labs and evaluation. ? ?EKG 12/28/2021: NSR.  Rate 81.  Cannot rule out anteroseptal wall MI, age undetermined. ? ?TTE 09/06/2020: ? 1. Left ventricular ejection fraction, by estimation, is 50 to 55%. The  ?left ventricle has low normal function. The left ventricle has no regional  ?wall motion abnormalities. There is mild concentric left ventricular  ?hypertrophy. Indeterminate diastolic  ?filling due to E-A fusion. The average left ventricular global  ?longitudinal strain is -19.6 %. The global longitudinal strain is normal.  ? 2. Right ventricular systolic function is normal. The right ventricular  ?size is normal. Tricuspid regurgitation signal is inadequate for assessing  ?PA pressure.  ? 3. Left atrial size was mildly dilated.  ? 4. The mitral valve is normal in structure. No evidence of mitral valve  ?regurgitation. No evidence of mitral stenosis.  ? 5. The aortic valve is normal in structure. Aortic valve regurgitation is  ?not visualized. No aortic stenosis is present.  ? 6. The inferior vena cava is normal in size with greater than 50%  ?respiratory variability, suggesting right atrial pressure of 3  mmHg.  ? ? ? ?Antionette Poles, PA-C ?[facility]/Anesthesiology ?Phone [phone] ?01/01/2022 1:45 PM ? ? ? ? ?

## 2022-01-01 NOTE — Anesthesia Preprocedure Evaluation (Addendum)
Anesthesia Evaluation  ?Patient identified by MRN, date of birth, ID band ?Patient awake ? ? ? ?Reviewed: ?Allergy & Precautions, NPO status , Patient's Chart, lab work & pertinent test results ? ?History of Anesthesia Complications ?Negative for: history of anesthetic complications ? ?Airway ?Mallampati: II ? ?TM Distance: >3 FB ?Neck ROM: Full ? ? ? Dental ?no notable dental hx. ?(+) Dental Advisory Given ?  ?Pulmonary ?Current Smoker,  ?  ?Pulmonary exam normal ? ? ? ? ? ? ? Cardiovascular ?hypertension, Pt. on medications ?+ CAD, + Past MI, + Cardiac Stents and + Peripheral Vascular Disease  ?Normal cardiovascular exam ? ? ?  ?Neuro/Psych ?CVA, No Residual Symptoms   ? GI/Hepatic ?negative GI ROS, Neg liver ROS,   ?Endo/Other  ?diabetes ? Renal/GU ?negative Renal ROS  ? ?  ?Musculoskeletal ?negative musculoskeletal ROS ?(+)  ? Abdominal ?  ?Peds ? Hematology ? ?(+) Blood dyscrasia, anemia ,   ?Anesthesia Other Findings ? ? Reproductive/Obstetrics ? ?  ? ? ? ? ? ? ? ? ? ? ? ? ? ?  ?  ? ? ? ? ? ? ?Anesthesia Physical ?Anesthesia Plan ? ?ASA: 3 ? ?Anesthesia Plan: General  ? ?Post-op Pain Management:   ? ?Induction: Intravenous ? ?PONV Risk Score and Plan: 2 and Ondansetron and Dexamethasone ? ?Airway Management Planned: Oral ETT ? ?Additional Equipment: Arterial line ? ?Intra-op Plan:  ? ?Post-operative Plan: Extubation in OR ? ?Informed Consent: I have reviewed the patients History and Physical, chart, labs and discussed the procedure including the risks, benefits and alternatives for the proposed anesthesia with the patient or authorized representative who has indicated his/her understanding and acceptance.  ? ? ? ?Dental advisory given ? ?Plan Discussed with: Anesthesiologist, CRNA and Surgeon ? ?Anesthesia Plan Comments: (PAT note by Antionette Poles, PA-C: ?Follows with cardiologist Dr. Shari Prows for history of HTN, CAD s/p possible PCI in 2010.  Patient reported that in 2010 when  he was living in [place], he was seen at the ED for chest discomfort and ultimately underwent PCI x1.  Details are unclear.  He was on Plavix for some time but it was ultimately discontinued after recurrent diverticular bleeding.  He has denied recurrent anginal symptoms.  TTE 09/2020 showed EF 50 to 55%, no regional WMA, no significant valvular disease.  Patient was last seen by Dr. Shari Prows 03/12/2021 and at that time reportedly feeling "great".  He was noted to be active without exertional symptoms.  No changes made to medical management.  Advised to follow-up in 8 months. ? 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 ? ?Patient will need day of surgery labs and evaluation. ? ?EKG 12/28/2021: NSR.  Rate 81.  Cannot rule out anteroseptal wall MI, age undetermined. ? ?TTE 09/06/2020: ??1. Left ventricular ejection fraction, by estimation, is 50 to 55%. The  ?left ventricle has low normal function. The left ventricle has no regional  ?wall motion abnormalities. There is mild concentric left ventricular  ?hypertrophy. Indeterminate diastolic  ?filling due to E-A fusion. The average left ventricular global  ?longitudinal strain is -19.6 %. The global longitudinal strain is normal.  ??2. Right ventricular systolic function is normal. The right ventricular  ?size is normal. Tricuspid regurgitation signal is inadequate for assessing  ?PA pressure.  ??3. Left atrial size was mildly dilated.  ??4. The mitral valve is normal in structure. No evidence of mitral valve  ?regurgitation. No evidence of mitral stenosis.  ??5. The aortic valve is normal in structure. Aortic valve  regurgitation is  ?not visualized. No aortic stenosis is present.  ??6. The inferior vena cava is normal in size with greater than 50%  ?respiratory variability, suggesting right atrial pressure of 3 mmHg.  ? ?)  ? ? ? ? ?Anesthesia Quick Evaluation ? ?

## 2022-01-01 NOTE — Progress Notes (Signed)
Mr. Feutz denies chest pain or shortness of breath. Patient denies having any s/s of Covid in hIs household.  Patient denies any known exposure to Covid.  ? ?Mr. [place].'s Drs. ?PCP: Dr Everrett Coombe ?Cardiologist is Dr. Laurance Flatten. ?Neurologist is Dr. Marjory Lies. ? ?Mr. Markey has type II diabetes, patient reports that fasting CBGs run 90-170. Last A1C was done in Dr. Ashley Royalty office  I instructed Mr. Rouch to not take Metformin morning of surgery; if CBG is greater than 70, take 1/2 of Lantus Insulin, 27 units. ? ?I instructed Mr. Dowlen to shower with antibacteria soap.  DO not shave. No nail polish, artificial or acrylic nails. Wear clean clothes, brush your teeth. ?Glasses, contact lens,dentures or partials may not be worn in the OR. If you need to wear them, please bring a case for glasses, do not wear contacts or bring a case, the hospital does not have contact cases, dentures or partials will have to be removed , make sure they are clean, we will provide a denture cup to put them in. You will need some one to drive you home and a responsible person over the age of 13 to stay with you for the first 24 hours after surgery.  ?

## 2022-01-02 ENCOUNTER — Inpatient Hospital Stay (HOSPITAL_COMMUNITY)
Admission: RE | Admit: 2022-01-02 | Discharge: 2022-01-05 | DRG: 253 | Disposition: A | Discharge status: Home Health | Payer: Medicare PPO | Source: Ambulatory Visit | Attending: Vascular Surgery | Admitting: Vascular Surgery

## 2022-01-02 ENCOUNTER — Ambulatory Visit: Payer: Medicare PPO | Admitting: Physical Therapy

## 2022-01-02 ENCOUNTER — Inpatient Hospital Stay (HOSPITAL_COMMUNITY): Payer: Medicare PPO

## 2022-01-02 ENCOUNTER — Inpatient Hospital Stay (HOSPITAL_COMMUNITY): Payer: Medicare PPO | Admitting: Anesthesiology

## 2022-01-02 ENCOUNTER — Other Ambulatory Visit: Payer: Self-pay

## 2022-01-02 ENCOUNTER — Encounter (HOSPITAL_COMMUNITY)
Admission: RE | Disposition: A | Discharge status: Home Health | Payer: Self-pay | Source: Ambulatory Visit | Attending: Vascular Surgery

## 2022-01-02 ENCOUNTER — Encounter (HOSPITAL_COMMUNITY): Payer: Self-pay | Admitting: Vascular Surgery

## 2022-01-02 DIAGNOSIS — I10 Essential (primary) hypertension: Secondary | ICD-10-CM

## 2022-01-02 DIAGNOSIS — Z7984 Long term (current) use of oral hypoglycemic drugs: Secondary | ICD-10-CM | POA: Diagnosis not present

## 2022-01-02 DIAGNOSIS — I2510 Atherosclerotic heart disease of native coronary artery without angina pectoris: Secondary | ICD-10-CM

## 2022-01-02 DIAGNOSIS — I70221 Atherosclerosis of native arteries of extremities with rest pain, right leg: Secondary | ICD-10-CM | POA: Diagnosis present

## 2022-01-02 DIAGNOSIS — I252 Old myocardial infarction: Secondary | ICD-10-CM

## 2022-01-02 DIAGNOSIS — D62 Acute posthemorrhagic anemia: Secondary | ICD-10-CM | POA: Diagnosis not present

## 2022-01-02 DIAGNOSIS — Z7982 Long term (current) use of aspirin: Secondary | ICD-10-CM | POA: Diagnosis not present

## 2022-01-02 DIAGNOSIS — F17210 Nicotine dependence, cigarettes, uncomplicated: Secondary | ICD-10-CM | POA: Diagnosis present

## 2022-01-02 DIAGNOSIS — Z818 Family history of other mental and behavioral disorders: Secondary | ICD-10-CM

## 2022-01-02 DIAGNOSIS — I739 Peripheral vascular disease, unspecified: Principal | ICD-10-CM | POA: Diagnosis present

## 2022-01-02 DIAGNOSIS — E1151 Type 2 diabetes mellitus with diabetic peripheral angiopathy without gangrene: Secondary | ICD-10-CM | POA: Diagnosis present

## 2022-01-02 DIAGNOSIS — Z79899 Other long term (current) drug therapy: Secondary | ICD-10-CM | POA: Diagnosis not present

## 2022-01-02 DIAGNOSIS — Z83438 Family history of other disorder of lipoprotein metabolism and other lipidemia: Secondary | ICD-10-CM | POA: Diagnosis not present

## 2022-01-02 DIAGNOSIS — K219 Gastro-esophageal reflux disease without esophagitis: Secondary | ICD-10-CM | POA: Diagnosis present

## 2022-01-02 DIAGNOSIS — Z833 Family history of diabetes mellitus: Secondary | ICD-10-CM | POA: Diagnosis not present

## 2022-01-02 DIAGNOSIS — Z9861 Coronary angioplasty status: Secondary | ICD-10-CM

## 2022-01-02 DIAGNOSIS — Z794 Long term (current) use of insulin: Secondary | ICD-10-CM | POA: Diagnosis not present

## 2022-01-02 DIAGNOSIS — Z823 Family history of stroke: Secondary | ICD-10-CM

## 2022-01-02 DIAGNOSIS — Z841 Family history of disorders of kidney and ureter: Secondary | ICD-10-CM

## 2022-01-02 DIAGNOSIS — Z8673 Personal history of transient ischemic attack (TIA), and cerebral infarction without residual deficits: Secondary | ICD-10-CM

## 2022-01-02 DIAGNOSIS — Z8249 Family history of ischemic heart disease and other diseases of the circulatory system: Secondary | ICD-10-CM | POA: Diagnosis not present

## 2022-01-02 HISTORY — DX: Anemia, unspecified: D64.9

## 2022-01-02 HISTORY — DX: Peripheral vascular disease, unspecified: I73.9

## 2022-01-02 HISTORY — DX: Acute myocardial infarction, unspecified: I21.9

## 2022-01-02 HISTORY — PX: LEG ANGIOGRAPHY: SHX6672

## 2022-01-02 HISTORY — DX: Personal history of urinary calculi: Z87.442

## 2022-01-02 HISTORY — PX: BYPASS GRAFT FEMORAL-PERONEAL: SHX5762

## 2022-01-02 LAB — GLUCOSE, CAPILLARY
Glucose-Capillary: 182 mg/dL — ABNORMAL HIGH (ref 70–99)
Glucose-Capillary: 158 mg/dL — ABNORMAL HIGH (ref 70–99)
Glucose-Capillary: 150 mg/dL — ABNORMAL HIGH (ref 70–99)
Glucose-Capillary: 166 mg/dL — ABNORMAL HIGH (ref 70–99)
Glucose-Capillary: 143 mg/dL — ABNORMAL HIGH (ref 70–99)
Glucose-Capillary: 166 mg/dL — ABNORMAL HIGH (ref 70–99)
Glucose-Capillary: 178 mg/dL — ABNORMAL HIGH (ref 70–99)

## 2022-01-02 LAB — TYPE AND SCREEN
ABO/RH(D): A POS
Antibody Screen: NEGATIVE

## 2022-01-02 LAB — COMPREHENSIVE METABOLIC PANEL
ALT: 37 U/L (ref 0–44)
AST: 25 U/L (ref 15–41)
Albumin: 3.5 g/dL (ref 3.5–5.0)
Alkaline Phosphatase: 87 U/L (ref 38–126)
Anion gap: 8 (ref 5–15)
BUN: 14 mg/dL (ref 8–23)
CO2: 21 mmol/L — ABNORMAL LOW (ref 22–32)
Calcium: 8.9 mg/dL (ref 8.9–10.3)
Chloride: 107 mmol/L (ref 98–111)
Creatinine, Ser: 0.85 mg/dL (ref 0.61–1.24)
GFR, Estimated: >60 mL/min (ref >60)
Glucose, Bld: 198 mg/dL — ABNORMAL HIGH (ref 70–99)
Potassium: 4 mmol/L (ref 3.5–5.1)
Sodium: 136 mmol/L (ref 135–145)
Total Bilirubin: 0.6 mg/dL (ref 0.3–1.2)
Total Protein: 6.7 g/dL (ref 6.5–8.1)

## 2022-01-02 LAB — CBC
HCT: 36.9 % — ABNORMAL LOW (ref 39.0–52.0)
Hemoglobin: 11.8 g/dL — ABNORMAL LOW (ref 13.0–17.0)
MCH: 28.7 pg (ref 26.0–34.0)
MCHC: 32 g/dL (ref 30.0–36.0)
MCV: 89.8 fL (ref 80.0–100.0)
Platelets: 259 10*3/uL (ref 150–400)
RBC: 4.11 MIL/uL — ABNORMAL LOW (ref 4.22–5.81)
RDW: 14.8 % (ref 11.5–15.5)
WBC: 10.2 10*3/uL (ref 4.0–10.5)
nRBC: 0 % (ref 0.0–0.2)

## 2022-01-02 LAB — URINALYSIS, ROUTINE W REFLEX MICROSCOPIC
Bilirubin Urine: NEGATIVE
Glucose, UA: 150 mg/dL — AB
Hgb urine dipstick: NEGATIVE
Ketones, ur: NEGATIVE mg/dL
Leukocytes,Ua: NEGATIVE
Nitrite: NEGATIVE
Protein, ur: NEGATIVE mg/dL
Specific Gravity, Urine: 1.018 (ref 1.005–1.030)
pH: 5 (ref 5.0–8.0)

## 2022-01-02 LAB — POCT ACTIVATED CLOTTING TIME
Activated Clotting Time: 155 seconds
Activated Clotting Time: 239 seconds
Activated Clotting Time: 263 seconds

## 2022-01-02 LAB — PROTIME-INR
INR: 0.9 (ref 0.8–1.2)
Prothrombin Time: 12.5 seconds (ref 11.4–15.2)

## 2022-01-02 LAB — APTT: aPTT: 28 seconds (ref 24–36)

## 2022-01-02 LAB — SURGICAL PCR SCREEN
MRSA, PCR: NEGATIVE
Staphylococcus aureus: NEGATIVE

## 2022-01-02 LAB — HEMOGLOBIN A1C
Hgb A1c MFr Bld: 8.6 % — ABNORMAL HIGH (ref 4.8–5.6)
Mean Plasma Glucose: 200.12 mg/dL

## 2022-01-02 LAB — ABO/RH: ABO/RH(D): A POS

## 2022-01-02 SURGERY — CREATION, BYPASS, ARTERIAL, FEMORAL TO PERONEAL, USING GRAFT
Anesthesia: General | Site: Groin | Laterality: Right

## 2022-01-02 MED ORDER — ACETAMINOPHEN 10 MG/ML IV SOLN
1000.0000 mg | Freq: Once | INTRAVENOUS | Status: AC
  Administered 2022-01-02: 1000 mg via INTRAVENOUS

## 2022-01-02 MED ORDER — PROPOFOL 10 MG/ML IV BOLUS
INTRAVENOUS | Status: DC | PRN
  Administered 2022-01-02 (×3): 30 mg via INTRAVENOUS
  Administered 2022-01-02: 150 mg via INTRAVENOUS
  Administered 2022-01-02: 20 mg via INTRAVENOUS

## 2022-01-02 MED ORDER — ALUM & MAG HYDROXIDE-SIMETH 200-200-20 MG/5ML PO SUSP: 15.0000 mL | ORAL | Status: DC | PRN

## 2022-01-02 MED ORDER — GUAIFENESIN-DM 100-10 MG/5ML PO SYRP: 15.0000 mL | ORAL_SOLUTION | ORAL | Status: DC | PRN

## 2022-01-02 MED ORDER — ONDANSETRON HCL 4 MG/2ML IJ SOLN
INTRAMUSCULAR | Status: DC | PRN
  Administered 2022-01-02: 4 mg via INTRAVENOUS

## 2022-01-02 MED ORDER — DEXAMETHASONE SODIUM PHOSPHATE 10 MG/ML IJ SOLN
INTRAMUSCULAR | Status: DC | PRN
  Administered 2022-01-02: 5 mg via INTRAVENOUS

## 2022-01-02 MED ORDER — ROCURONIUM BROMIDE 10 MG/ML (PF) SYRINGE
PREFILLED_SYRINGE | INTRAVENOUS | Status: AC
  Filled 2022-01-02: qty 20

## 2022-01-02 MED ORDER — CEFAZOLIN SODIUM-DEXTROSE 2-4 GM/100ML-% IV SOLN
2.0000 g | INTRAVENOUS | Status: AC
  Administered 2022-01-02 (×2): 2 g via INTRAVENOUS
  Filled 2022-01-02: qty 100

## 2022-01-02 MED ORDER — AMLODIPINE BESYLATE 5 MG PO TABS
5.0000 mg | ORAL_TABLET | Freq: Every day | ORAL | Status: DC
  Administered 2022-01-03 – 2022-01-05 (×3): 5 mg via ORAL
  Filled 2022-01-02 (×3): qty 1

## 2022-01-02 MED ORDER — BISACODYL 5 MG PO TBEC: 5.0000 mg | DELAYED_RELEASE_TABLET | Freq: Every day | ORAL | Status: DC | PRN

## 2022-01-02 MED ORDER — DEXMEDETOMIDINE (PRECEDEX) IN NS 20 MCG/5ML (4 MCG/ML) IV SYRINGE
PREFILLED_SYRINGE | INTRAVENOUS | Status: DC | PRN
  Administered 2022-01-02 (×2): 8 ug via INTRAVENOUS

## 2022-01-02 MED ORDER — PROPOFOL 10 MG/ML IV BOLUS
INTRAVENOUS | Status: AC
  Filled 2022-01-02: qty 20

## 2022-01-02 MED ORDER — PHENOL 1.4 % MT LIQD: 1.0000 | OROMUCOSAL | Status: DC | PRN

## 2022-01-02 MED ORDER — INSULIN ASPART 100 UNIT/ML IJ SOLN
0.0000 [IU] | Freq: Three times a day (TID) | INTRAMUSCULAR | Status: DC
  Administered 2022-01-03: 5 [IU] via SUBCUTANEOUS
  Administered 2022-01-03: 2 [IU] via SUBCUTANEOUS
  Administered 2022-01-03 – 2022-01-04 (×2): 5 [IU] via SUBCUTANEOUS
  Administered 2022-01-04 – 2022-01-05 (×3): 3 [IU] via SUBCUTANEOUS

## 2022-01-02 MED ORDER — ONDANSETRON HCL 4 MG/2ML IJ SOLN
INTRAMUSCULAR | Status: AC
  Filled 2022-01-02: qty 2

## 2022-01-02 MED ORDER — PHENYLEPHRINE 40 MCG/ML (10ML) SYRINGE FOR IV PUSH (FOR BLOOD PRESSURE SUPPORT)
PREFILLED_SYRINGE | INTRAVENOUS | Status: AC
  Filled 2022-01-02: qty 10

## 2022-01-02 MED ORDER — EPHEDRINE SULFATE-NACL 50-0.9 MG/10ML-% IV SOSY
PREFILLED_SYRINGE | INTRAVENOUS | Status: DC | PRN
  Administered 2022-01-02 (×4): 5 mg via INTRAVENOUS

## 2022-01-02 MED ORDER — PHENYLEPHRINE HCL-NACL 20-0.9 MG/250ML-% IV SOLN
INTRAVENOUS | Status: DC | PRN
  Administered 2022-01-02: 25 ug/min via INTRAVENOUS

## 2022-01-02 MED ORDER — HYDRALAZINE HCL 20 MG/ML IJ SOLN: 5.0000 mg | INTRAMUSCULAR | Status: DC | PRN

## 2022-01-02 MED ORDER — LIDOCAINE 2% (20 MG/ML) 5 ML SYRINGE
INTRAMUSCULAR | Status: AC
  Filled 2022-01-02: qty 5

## 2022-01-02 MED ORDER — LORATADINE 10 MG PO TABS
10.0000 mg | ORAL_TABLET | Freq: Every day | ORAL | Status: DC
Start: 2022-01-03 — End: 2022-01-05
  Administered 2022-01-03 – 2022-01-05 (×3): 10 mg via ORAL
  Filled 2022-01-02 (×3): qty 1

## 2022-01-02 MED ORDER — GABAPENTIN 300 MG PO CAPS
300.0000 mg | ORAL_CAPSULE | Freq: Every evening | ORAL | Status: DC | PRN
  Administered 2022-01-03: 300 mg via ORAL
  Filled 2022-01-02: qty 1

## 2022-01-02 MED ORDER — ATORVASTATIN CALCIUM 80 MG PO TABS
80.0000 mg | ORAL_TABLET | Freq: Every day | ORAL | Status: DC
Start: 2022-01-03 — End: 2022-01-05
  Administered 2022-01-03 – 2022-01-05 (×3): 80 mg via ORAL
  Filled 2022-01-02 (×3): qty 1

## 2022-01-02 MED ORDER — ASPIRIN EC 81 MG PO TBEC
81.0000 mg | DELAYED_RELEASE_TABLET | Freq: Every day | ORAL | Status: DC
  Administered 2022-01-03 – 2022-01-05 (×3): 81 mg via ORAL
  Filled 2022-01-02 (×3): qty 1

## 2022-01-02 MED ORDER — METFORMIN HCL ER 500 MG PO TB24: 1000.0000 mg | ORAL_TABLET | Freq: Every day | ORAL | Status: DC

## 2022-01-02 MED ORDER — MAGNESIUM SULFATE 2 GM/50ML IV SOLN: 2.0000 g | Freq: Every day | INTRAVENOUS | Status: DC | PRN

## 2022-01-02 MED ORDER — ACETAMINOPHEN 10 MG/ML IV SOLN
INTRAVENOUS | Status: AC
  Filled 2022-01-02: qty 100

## 2022-01-02 MED ORDER — LACTATED RINGERS IV SOLN: INTRAVENOUS | Status: DC | PRN

## 2022-01-02 MED ORDER — DEXAMETHASONE SODIUM PHOSPHATE 10 MG/ML IJ SOLN
INTRAMUSCULAR | Status: AC
  Filled 2022-01-02: qty 1

## 2022-01-02 MED ORDER — ORAL CARE MOUTH RINSE: 15.0000 mL | Freq: Once | OROMUCOSAL | Status: AC

## 2022-01-02 MED ORDER — SODIUM CHLORIDE 0.9 % IV SOLN: 500.0000 mL | Freq: Once | INTRAVENOUS | Status: DC | PRN

## 2022-01-02 MED ORDER — PROTAMINE SULFATE 10 MG/ML IV SOLN
INTRAVENOUS | Status: DC | PRN
  Administered 2022-01-02: 50 mg via INTRAVENOUS

## 2022-01-02 MED ORDER — MIDAZOLAM HCL 2 MG/2ML IJ SOLN
INTRAMUSCULAR | Status: AC
  Filled 2022-01-02: qty 2

## 2022-01-02 MED ORDER — SODIUM CHLORIDE 0.9 % IV SOLN: INTRAVENOUS | Status: DC

## 2022-01-02 MED ORDER — HEPARIN 6000 UNIT IRRIGATION SOLUTION
Status: AC
  Filled 2022-01-02: qty 500

## 2022-01-02 MED ORDER — FENTANYL CITRATE (PF) 100 MCG/2ML IJ SOLN
25.0000 ug | INTRAMUSCULAR | Status: DC | PRN
  Administered 2022-01-02: 25 ug via INTRAVENOUS
  Administered 2022-01-02: 50 ug via INTRAVENOUS
  Administered 2022-01-02: 25 ug via INTRAVENOUS

## 2022-01-02 MED ORDER — OXYCODONE-ACETAMINOPHEN 5-325 MG PO TABS
1.0000 | ORAL_TABLET | ORAL | Status: DC | PRN
  Administered 2022-01-03 – 2022-01-05 (×6): 2 via ORAL
  Filled 2022-01-02 (×7): qty 2

## 2022-01-02 MED ORDER — EPHEDRINE 5 MG/ML INJ
INTRAVENOUS | Status: AC
  Filled 2022-01-02: qty 5

## 2022-01-02 MED ORDER — ONDANSETRON HCL 4 MG/2ML IJ SOLN: 4.0000 mg | Freq: Four times a day (QID) | INTRAMUSCULAR | Status: DC | PRN

## 2022-01-02 MED ORDER — FENTANYL CITRATE (PF) 250 MCG/5ML IJ SOLN
INTRAMUSCULAR | Status: AC
  Filled 2022-01-02: qty 5

## 2022-01-02 MED ORDER — METOPROLOL TARTRATE 5 MG/5ML IV SOLN: 2.0000 mg | INTRAVENOUS | Status: DC | PRN

## 2022-01-02 MED ORDER — HEPARIN SODIUM (PORCINE) 1000 UNIT/ML IJ SOLN
INTRAMUSCULAR | Status: DC | PRN
  Administered 2022-01-02: 2000 [IU] via INTRAVENOUS
  Administered 2022-01-02: 10000 [IU] via INTRAVENOUS
  Administered 2022-01-02: 5000 [IU] via INTRAVENOUS

## 2022-01-02 MED ORDER — SUGAMMADEX SODIUM 200 MG/2ML IV SOLN
INTRAVENOUS | Status: DC | PRN
  Administered 2022-01-02: 200 mg via INTRAVENOUS

## 2022-01-02 MED ORDER — CHLORHEXIDINE GLUCONATE CLOTH 2 % EX PADS
6.0000 | MEDICATED_PAD | Freq: Once | CUTANEOUS | Status: DC
  Administered 2022-01-02: 6 via TOPICAL

## 2022-01-02 MED ORDER — FENTANYL CITRATE (PF) 100 MCG/2ML IJ SOLN
INTRAMUSCULAR | Status: AC
  Filled 2022-01-02: qty 2

## 2022-01-02 MED ORDER — INSULIN ASPART 100 UNIT/ML IJ SOLN
0.0000 [IU] | INTRAMUSCULAR | Status: AC | PRN
  Administered 2022-01-02: 4 [IU] via SUBCUTANEOUS
  Administered 2022-01-02 (×3): 2 [IU] via SUBCUTANEOUS
  Filled 2022-01-02: qty 1

## 2022-01-02 MED ORDER — MAGNESIUM OXIDE -MG SUPPLEMENT 400 (240 MG) MG PO TABS
200.0000 mg | ORAL_TABLET | Freq: Every day | ORAL | Status: DC
  Administered 2022-01-03 – 2022-01-05 (×3): 200 mg via ORAL
  Filled 2022-01-02 (×3): qty 1

## 2022-01-02 MED ORDER — PANTOPRAZOLE SODIUM 40 MG PO TBEC: 40.0000 mg | DELAYED_RELEASE_TABLET | Freq: Every day | ORAL | Status: DC

## 2022-01-02 MED ORDER — ACETAMINOPHEN 650 MG RE SUPP: 325.0000 mg | RECTAL | Status: DC | PRN

## 2022-01-02 MED ORDER — LABETALOL HCL 5 MG/ML IV SOLN
10.0000 mg | INTRAVENOUS | Status: DC | PRN
  Administered 2022-01-04: 10 mg via INTRAVENOUS
  Filled 2022-01-02: qty 4

## 2022-01-02 MED ORDER — PHENYLEPHRINE 40 MCG/ML (10ML) SYRINGE FOR IV PUSH (FOR BLOOD PRESSURE SUPPORT)
PREFILLED_SYRINGE | INTRAVENOUS | Status: DC | PRN
  Administered 2022-01-02 (×13): 80 ug via INTRAVENOUS
  Administered 2022-01-02: 120 ug via INTRAVENOUS

## 2022-01-02 MED ORDER — LISINOPRIL 20 MG PO TABS
40.0000 mg | ORAL_TABLET | Freq: Every day | ORAL | Status: DC
  Administered 2022-01-03 – 2022-01-05 (×3): 40 mg via ORAL
  Filled 2022-01-02 (×3): qty 2

## 2022-01-02 MED ORDER — DEXMEDETOMIDINE (PRECEDEX) IN NS 20 MCG/5ML (4 MCG/ML) IV SYRINGE
PREFILLED_SYRINGE | INTRAVENOUS | Status: AC
  Filled 2022-01-02: qty 5

## 2022-01-02 MED ORDER — HEPARIN 6000 UNIT IRRIGATION SOLUTION
Status: DC | PRN
  Administered 2022-01-02: 1

## 2022-01-02 MED ORDER — LIDOCAINE 2% (20 MG/ML) 5 ML SYRINGE
INTRAMUSCULAR | Status: DC | PRN
  Administered 2022-01-02: 100 mg via INTRAVENOUS

## 2022-01-02 MED ORDER — ACETAMINOPHEN 325 MG PO TABS: 325.0000 mg | ORAL_TABLET | ORAL | Status: DC | PRN

## 2022-01-02 MED ORDER — HYDROMORPHONE HCL 1 MG/ML IJ SOLN
0.5000 mg | INTRAMUSCULAR | Status: DC | PRN
  Administered 2022-01-02: 1 mg via INTRAVENOUS
  Filled 2022-01-02: qty 1

## 2022-01-02 MED ORDER — 0.9 % SODIUM CHLORIDE (POUR BTL) OPTIME
TOPICAL | Status: DC | PRN
  Administered 2022-01-02 (×2): 1000 mL

## 2022-01-02 MED ORDER — HEPARIN SODIUM (PORCINE) 5000 UNIT/ML IJ SOLN
5000.0000 [IU] | Freq: Three times a day (TID) | INTRAMUSCULAR | Status: DC
  Administered 2022-01-03 – 2022-01-05 (×7): 5000 [IU] via SUBCUTANEOUS
  Filled 2022-01-02 (×7): qty 1

## 2022-01-02 MED ORDER — CARVEDILOL 12.5 MG PO TABS
12.5000 mg | ORAL_TABLET | Freq: Two times a day (BID) | ORAL | Status: DC
  Administered 2022-01-03 – 2022-01-05 (×5): 12.5 mg via ORAL
  Filled 2022-01-02 (×5): qty 1

## 2022-01-02 MED ORDER — ALBUMIN HUMAN 5 % IV SOLN: INTRAVENOUS | Status: DC | PRN

## 2022-01-02 MED ORDER — LACTATED RINGERS IV SOLN: INTRAVENOUS | Status: DC

## 2022-01-02 MED ORDER — CHLORHEXIDINE GLUCONATE 0.12 % MT SOLN: 15.0000 mL | Freq: Once | OROMUCOSAL | Status: AC

## 2022-01-02 MED ORDER — PANTOPRAZOLE SODIUM 40 MG PO TBEC
40.0000 mg | DELAYED_RELEASE_TABLET | Freq: Every day | ORAL | Status: DC
  Administered 2022-01-03 – 2022-01-05 (×3): 40 mg via ORAL
  Filled 2022-01-02 (×3): qty 1

## 2022-01-02 MED ORDER — AMISULPRIDE (ANTIEMETIC) 5 MG/2ML IV SOLN: 10.0000 mg | Freq: Once | INTRAVENOUS | Status: DC | PRN

## 2022-01-02 MED ORDER — FENTANYL CITRATE (PF) 250 MCG/5ML IJ SOLN
INTRAMUSCULAR | Status: DC | PRN
  Administered 2022-01-02 (×3): 50 ug via INTRAVENOUS
  Administered 2022-01-02: 100 ug via INTRAVENOUS
  Administered 2022-01-02 (×3): 50 ug via INTRAVENOUS

## 2022-01-02 MED ORDER — POTASSIUM CHLORIDE CRYS ER 20 MEQ PO TBCR: 20.0000 meq | EXTENDED_RELEASE_TABLET | Freq: Every day | ORAL | Status: DC | PRN

## 2022-01-02 MED ORDER — MIDAZOLAM HCL 2 MG/2ML IJ SOLN
INTRAMUSCULAR | Status: DC | PRN
  Administered 2022-01-02: 2 mg via INTRAVENOUS

## 2022-01-02 MED ORDER — MAGNESIUM 200 MG PO TABS
200.0000 mg | ORAL_TABLET | Freq: Every day | ORAL | Status: DC
  Filled 2022-01-02: qty 1

## 2022-01-02 MED ORDER — ROCURONIUM BROMIDE 10 MG/ML (PF) SYRINGE
PREFILLED_SYRINGE | INTRAVENOUS | Status: DC | PRN
  Administered 2022-01-02: 40 mg via INTRAVENOUS
  Administered 2022-01-02: 20 mg via INTRAVENOUS
  Administered 2022-01-02: 100 mg via INTRAVENOUS
  Administered 2022-01-02: 30 mg via INTRAVENOUS

## 2022-01-02 MED ORDER — CEFAZOLIN SODIUM-DEXTROSE 2-4 GM/100ML-% IV SOLN
2.0000 g | Freq: Three times a day (TID) | INTRAVENOUS | Status: AC
  Administered 2022-01-02 – 2022-01-03 (×2): 2 g via INTRAVENOUS
  Filled 2022-01-02 (×2): qty 100

## 2022-01-02 MED ORDER — DOCUSATE SODIUM 100 MG PO CAPS
100.0000 mg | ORAL_CAPSULE | Freq: Every day | ORAL | Status: DC
  Administered 2022-01-03 – 2022-01-05 (×3): 100 mg via ORAL
  Filled 2022-01-02 (×3): qty 1

## 2022-01-02 MED ORDER — IODIXANOL 320 MG/ML IV SOLN
INTRAVENOUS | Status: DC | PRN
  Administered 2022-01-02: 40 mL

## 2022-01-02 MED ORDER — CHLORHEXIDINE GLUCONATE 0.12 % MT SOLN
OROMUCOSAL | Status: AC
  Administered 2022-01-02: 15 mL via OROMUCOSAL
  Filled 2022-01-02: qty 15

## 2022-01-02 MED ORDER — INSULIN GLARGINE 100 UNIT/ML SOLOSTAR PEN: 55.0000 [IU] | PEN_INJECTOR | Freq: Every morning | SUBCUTANEOUS | Status: DC

## 2022-01-02 SURGICAL SUPPLY — 54 items
BAG COUNTER SPONGE SURGICOUNT (BAG) ×2 IMPLANT
BANDAGE ESMARK 6X9 LF (GAUZE/BANDAGES/DRESSINGS) IMPLANT
BNDG ELASTIC 4X5.8 VLCR STR LF (GAUZE/BANDAGES/DRESSINGS) ×1 IMPLANT
BNDG ELASTIC 6X5.8 VLCR STR LF (GAUZE/BANDAGES/DRESSINGS) ×1 IMPLANT
BNDG ESMARK 6X9 LF (GAUZE/BANDAGES/DRESSINGS) ×2
CANISTER SUCT 3000ML PPV (MISCELLANEOUS) ×2 IMPLANT
CLIP LIGATING EXTRA MED SLVR (CLIP) ×1 IMPLANT
CLIP LIGATING EXTRA SM BLUE (MISCELLANEOUS) ×2 IMPLANT
COVER PROBE W GEL 5X96 (DRAPES) ×1 IMPLANT
CUFF TOURN SGL QUICK 34 (TOURNIQUET CUFF) ×1
CUFF TRNQT CYL 34X4X40X1 (TOURNIQUET CUFF) IMPLANT
DERMABOND ADVANCED (GAUZE/BANDAGES/DRESSINGS) ×2
DERMABOND ADVANCED .7 DNX12 (GAUZE/BANDAGES/DRESSINGS) IMPLANT
DRAPE C-ARM 42X72 X-RAY (DRAPES) ×1 IMPLANT
ELECT REM PT RETURN 9FT ADLT (ELECTROSURGICAL) ×2
ELECTRODE REM PT RTRN 9FT ADLT (ELECTROSURGICAL) ×1 IMPLANT
GAUZE 4X4 16PLY ~~LOC~~+RFID DBL (SPONGE) ×1 IMPLANT
GAUZE SPONGE 4X4 12PLY STRL (GAUZE/BANDAGES/DRESSINGS) ×2 IMPLANT
GLOVE SURG POLYISO LF SZ6.5 (GLOVE) ×1 IMPLANT
GLOVE SURG POLYISO LF SZ8 (GLOVE) ×5 IMPLANT
GOWN STRL REUS W/ TWL LRG LVL3 (GOWN DISPOSABLE) ×2 IMPLANT
GOWN STRL REUS W/ TWL XL LVL3 (GOWN DISPOSABLE) ×1 IMPLANT
GOWN STRL REUS W/TWL LRG LVL3 (GOWN DISPOSABLE) ×3
GOWN STRL REUS W/TWL XL LVL3 (GOWN DISPOSABLE) ×2
HEAD CUTTING VALVULOTOME LEMTR (VASCULAR PRODUCTS) ×1 IMPLANT
HEMOSTAT SNOW SURGICEL 2X4 (HEMOSTASIS) IMPLANT
KIT BASIN OR (CUSTOM PROCEDURE TRAY) ×2 IMPLANT
KIT TURNOVER KIT B (KITS) ×2 IMPLANT
MARKER SKIN DUAL TIP RULER LAB (MISCELLANEOUS) ×1 IMPLANT
NS IRRIG 1000ML POUR BTL (IV SOLUTION) ×4 IMPLANT
PACK PERIPHERAL VASCULAR (CUSTOM PROCEDURE TRAY) ×2 IMPLANT
PAD ARMBOARD 7.5X6 YLW CONV (MISCELLANEOUS) ×4 IMPLANT
SET COLLECT BLD 21X3/4 12 (NEEDLE) ×1 IMPLANT
SET MICROPUNCTURE 5F STIFF (MISCELLANEOUS) ×1 IMPLANT
SPONGE T-LAP 18X18 ~~LOC~~+RFID (SPONGE) ×2 IMPLANT
STOPCOCK 4 WAY LG BORE MALE ST (IV SETS) ×1 IMPLANT
SUT MNCRL AB 4-0 PS2 18 (SUTURE) ×6 IMPLANT
SUT PROLENE 5 0 C 1 24 (SUTURE) ×2 IMPLANT
SUT PROLENE 6 0 BV (SUTURE) ×10 IMPLANT
SUT SILK 2 0 SH (SUTURE) ×2 IMPLANT
SUT SILK 3 0 (SUTURE) ×4
SUT SILK 3-0 18XBRD TIE 12 (SUTURE) IMPLANT
SUT VIC AB 2-0 CT1 27 (SUTURE) ×3
SUT VIC AB 2-0 CT1 TAPERPNT 27 (SUTURE) ×2 IMPLANT
SUT VIC AB 3-0 SH 27 (SUTURE) ×4
SUT VIC AB 3-0 SH 27X BRD (SUTURE) ×2 IMPLANT
SYR 20ML LL LF (SYRINGE) ×2 IMPLANT
TOWEL GREEN STERILE (TOWEL DISPOSABLE) ×2 IMPLANT
TRAY FOLEY MTR SLVR 16FR STAT (SET/KITS/TRAYS/PACK) ×2 IMPLANT
TUBING EXTENTION W/L.L. (IV SETS) ×1 IMPLANT
UNDERPAD 30X36 HEAVY ABSORB (UNDERPADS AND DIAPERS) ×2 IMPLANT
VALVULOTOME HEAD CUTTING LEMTR (VASCULAR PRODUCTS) IMPLANT
VALVULOTOME LEMAITRE (VASCULAR PRODUCTS) ×2
WATER STERILE IRR 1000ML POUR (IV SOLUTION) ×2 IMPLANT

## 2022-01-02 NOTE — Anesthesia Postprocedure Evaluation (Signed)
Anesthesia Post Note ? ?Patient: Erik Rose ? ?Procedure(s) Performed: RIGHT FEMORAL-PERONEAL BYPASS GRAFT (Right: Groin) ?LEG ANGIOGRAPHY (Right: Groin) ? ?  ? ?Patient location during evaluation: PACU ?Anesthesia Type: General ?Level of consciousness: sedated ?Pain management: pain level controlled ?Vital Signs Assessment: post-procedure vital signs reviewed and stable ?Respiratory status: spontaneous breathing and respiratory function stable ?Cardiovascular status: stable ?Postop Assessment: no apparent nausea or vomiting ?Anesthetic complications: no ? ? ?No notable events documented. ? ?Last Vitals:  ?Vitals:  ? 01/02/22 1645 01/02/22 1700  ?BP: 118/86 (!) 154/84  ?Pulse: 80 81  ?Resp: 18 15  ?Temp:    ?SpO2: 97% 100%  ?  ?Last Pain:  ?Vitals:  ? 01/02/22 1700  ?TempSrc:   ?PainSc: Asleep  ? ? ?  ?  ?  ?  ?  ?  ? ?[name] DANIEL ? ? ? ? ?

## 2022-01-02 NOTE — Transfer of Care (Signed)
Immediate Anesthesia Transfer of Care Note ? ?Patient: Erik Rose ? ?Procedure(s) Performed: RIGHT FEMORAL-PERONEAL BYPASS GRAFT (Right: Groin) ?LEG ANGIOGRAPHY (Right: Groin) ? ?Patient Location: PACU ? ?Anesthesia Type:General ? ?Level of Consciousness: drowsy and patient cooperative ? ?Airway & Oxygen Therapy: Patient Spontanous Breathing and Patient connected to face mask oxygen ? ?Post-op Assessment: Report given to RN, Post -op Vital signs reviewed and stable and Patient moving all extremities X 4 ? ?Post vital signs: Reviewed and stable ? ?Last Vitals:  ?Vitals Value Taken Time  ?BP 133/77 01/02/22 1543  ?Temp    ?Pulse 90 01/02/22 1545  ?Resp 19 01/02/22 1545  ?SpO2 94 % 01/02/22 1545  ?Vitals shown include unvalidated device data. ? ?Last Pain:  ?Vitals:  ? 01/02/22 0728  ?TempSrc: Oral  ?   ? ?  ? ?Complications: No notable events documented. ?

## 2022-01-02 NOTE — Anesthesia Procedure Notes (Signed)
Procedure Name: Intubation ?Date/Time: 01/02/2022 10:04 AM ?Performed by: Rande Brunt, CRNA ?Pre-anesthesia Checklist: Patient identified, Emergency Drugs available, Suction available and Patient being monitored ?Patient Re-evaluated:Patient Re-evaluated prior to induction ?Oxygen Delivery Method: Circle System Utilized ?Preoxygenation: Pre-oxygenation with 100% oxygen ?Induction Type: IV induction ?Ventilation: Mask ventilation without difficulty ?Laryngoscope Size: Mac and 4 ?Grade View: Grade II ?Tube type: Oral ?Tube size: 7.0 mm ?Number of attempts: 1 ?Airway Equipment and Method: Stylet and Oral airway ?Placement Confirmation: ETT inserted through vocal cords under direct vision, positive ETCO2 and breath sounds checked- equal and bilateral ?Secured at: 22 cm ?Tube secured with: Tape ?Dental Injury: Teeth and Oropharynx as per pre-operative assessment  ? ? ? ? ?

## 2022-01-02 NOTE — Op Note (Signed)
DATE OF SERVICE: 01/02/2022 ? ?PATIENT:  Erik Rose  66 y.o. male ? 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 ?POST-OPERATIVE DIAGNOSIS:  Same ? ?PROCEDURE:   ?1) right common femoral to peroneal bypass with in-situ greater saphenous vein ?2) right lower extremity angiogram ? ?SURGEON:  Surgeon(s) and Role: ?   * Cherre Robins, MD - Primary ? ?ASSISTANT: Arlee Muslim, PA-C ? Gerri Lins, PA-C ? ?An experienced assistant was required given the complexity of this procedure and the standard of surgical care. My assistant helped with exposure through counter tension, suctioning, ligation and retraction to better visualize the surgical field.  My assistant expedited sewing during the case by following my sutures. Wherever I use the term "we" in the report, my assistant actively helped me with that portion of the procedure. ? ?ANESTHESIA:   general ? ?EBL: 337mL ? ?BLOOD ADMINISTERED:none ? ?DRAINS: none  ? ?LOCAL MEDICATIONS USED:  NONE ? ?SPECIMEN:  none ? ?COUNTS: confirmed correct. ? ?TOURNIQUET:    ?Total Tourniquet Time Documented: ?Thigh (Right) - 18 minutes ?Total: Thigh (Right) - 18 minutes ? ?PATIENT DISPOSITION:  PACU - hemodynamically stable. ?  ?Delay start of Pharmacological VTE agent (>24hrs) due to surgical blood loss or risk of bleeding: no ? ?INDICATION FOR PROCEDURE: Erik Rose is a 66 y.o. male with ischemic rest pain of the right lower extremity. Preoperative angiogram showed right popliteal artery occlusion and peroneal only runoff. After careful discussion of risks, benefits, and alternatives the patient was offered right common femoral to peroneal artery bypass. The patient understood and wished to proceed. ? ?OPERATIVE FINDINGS: Healthy common femoral artery suitable for inflow.  Greater saphenous vein left in situ.  Vein appeared adequate for bypass conduit.  Peroneal artery was difficult to  expose, but ultimately identified soft area to sew our distal anastomosis.  The greater saphenous vein was exposed in the both arterial exposures and mobilized for a significant course to allow in situ bypass.  The proximal anastomosis was performed first.  Valve lysis was performed with a LeMaitre valvulotome.  Good inflow was achieved.  The distal anastomosis was then performed.  Good Doppler flow was heard in the peroneal artery.  The vein graft was then carefully explored with duplex ultrasound.  All side branches could be identified by ultrasound were ligated and divided.  Completion angiogram was then performed 1 additional fistula was identified and ligated.  No technical difficulty was noted either anastomosis.  There is brisk Doppler flow at the dorsalis pedis pulse at completion. ? ?DESCRIPTION OF PROCEDURE: After identification of the patient in the pre-operative holding area, the patient was transferred to the operating room. The patient was positioned supine on the operating room table. Anesthesia was induced. The right leg was prepped and draped in standard fashion. A surgical pause was performed confirming correct patient, procedure, and operative location. ? ?An oblique incision was made in the right groin to expose the common femoral artery and its bifurcation.  The incision was carried out subtenons tissue until the femoral sheath was identified and carefully entered.  The common femoral artery, profunda femoris artery, and superficial femoral artery were encircled with Silastic vessel loop.  The saphenofemoral junction was identified.  The greater saphenous vein was skeletonized for several centimeters past its confluence with the common femoral vein. ? ?Longitudinal incision was made in the right medial calf extending from the knee to the mid leg.  Incision was  carried out subtenons tissue until the fascia of the posterior compartment was identified.  The superficial posterior compartment was  incised with Bovie electrocautery.  The soleus muscle was divided.  Identified the posterior tibial vein.  I rotated the posterior tibial neurovascular bundle down.  It was difficult to find the peroneal artery.  I carried my exposure cranially and identified the tibioperoneal trunk.  I then carried my exposure distally and found a soft area of peroneal artery. ? ?The saphenofemoral junction was clamped with a Gregory clamp.  All accessory branches of the saphenous vein were ligated and divided.  The greater saphenous vein was transected free with an 11 blade.  Saphenofemoral junction venotomy was closed in 2 layers using 5-0 Prolene. ? ?The patient was systemically heparinized.  Activated clotting time measurements were used throughout the case to confirm adequate anticoagulation.  The common femoral artery, profunda femoris artery, and superficial femoral artery were clamped.  An anterior arteriotomy was made with 11 blade.  The arteriotomy was widened using small Potts scissors.  The saphenofemoral junction was then sewn to the arteriotomy using continuous running suture of 5-0 Prolene.  The anastomosis was completed.  The anastomosis was flushed down through the greater saphenous vein. ? ?The greater saphenous vein was mobilized in the calf incision.  The distal end of the vein was clamped with a right angle.  The vein was transected.  The stump was oversewn with a 2-0 silk.  A LeMaitre valvulotome was then passed into the greater saphenous vein to just distal to our proximal anastomosis.  3 passes of the valvulotome were performed.  Good inflow was achieved. ? ?The distal end of the vascular graft was cut to lay on the peroneal artery without undue tension or redundancy.  A pneumatic tourniquet was applied to the distal thigh.  The leg was exsanguinated with an Esmarch tourniquet.  The pneumatic tourniquet was inflated.  An arteriotomy was made on the peroneal artery with an 11 blade and extended with Potts  scissors.  The spatulated distal end of the greater saphenous vein was sewn end to side using continuous running suture of 6-0 Prolene.  After completing the anastomosis the pneumatic tourniquet was released. ? ?The greater saphenous vein was interrogated with duplex ultrasound.  Several areas of side branches were identified.  Small cut down incisions were made over the greater saphenous vein graft.  The side branches were identified, ligated proximally distally, and divided.  As we did this sequentially, the pulse in the bypass graft improved considerably. ? ?I accessed the common femoral artery under direct visualization with micropuncture technique.  Angiogram was performed of the right lower extremity in stations.  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  There were no technical issues observed at the anastomoses. ? ?The remaining fistula was identified and ligated.  The bypass was then evaluated with Doppler machine.  Strongly biphasic Doppler flow was heard in the peroneal artery distal to the anastomosis.  This nearly obliterated with occlusion of the bypass.  Strong Doppler signal was heard in the dorsalis pedis artery in the right foot.  Satisfactory to the case here. ? ?Heparin was reversed with protamine.  Incisions were closed in layers using 2-0 Vicryl, 3-0 Vicryl, 4-0 Monocryl.  Dermabond was applied. ? ?Upon completion of the case instrument and sharps counts were confirmed correct. The patient was transferred to the PACU in good condition. I was present for all portions of the procedure. ? ?Yevonne Aline.  Stanford Breed, MD ?[facility] ?Office Phone Number: [phone] ?01/02/2022 3:23 PM ? ? ? ?

## 2022-01-02 NOTE — Progress Notes (Signed)
Asked to evaluate signals in right foot ?Patient resting comfortably, c/o right leg soreness ?Able to move toes, and sensation intect ?Minimal drainage through ACE wrap ?Brisk peroneal and DP signal and faint PT ?No concerns over bypass ? ?[name] ?

## 2022-01-02 NOTE — Anesthesia Procedure Notes (Signed)
Arterial Line Insertion ?Start/End3/29/2023 9:15 AM, 01/02/2022 9:20 AM ?Performed by: Aundria Rud, CRNA ? Patient location: Pre-op. ?Preanesthetic checklist: patient identified, IV checked, site marked, risks and benefits discussed, surgical consent, monitors and equipment checked, pre-op evaluation, timeout performed and anesthesia consent ?Lidocaine 1% used for infiltration ?Left, radial was placed ?Catheter size: 20 G ?Hand hygiene performed  and maximum sterile barriers used  ? ?Attempts: 1 ?Procedure performed without using ultrasound guided technique. ?Following insertion, dressing applied and Biopatch. ?Post procedure assessment: normal and unchanged ? ?Patient tolerated the procedure well with no immediate complications. ? ? ?

## 2022-01-02 NOTE — Progress Notes (Signed)
This RN and Alean Rinne, RN transported pt to 4E room. 4E RN Arlys John and second 4E RN there to check pt in. While using doppler to find pulses, we were unable to find a PT pulse. On last assessment in PACU, pt pulses were both heard on doppler. DP pulse was still heard on doppler and femoral pulse was palpable +2, cap refill good and foot warm. Throughout PACU stay, pt kept bending leg despite this RN reminding pt to keep straight. This RN called and spoke with Dr. Lenell Antu, who said he would let the on call know, but he was not concerned about this finding. I informed 4E RN Arlys John ?

## 2022-01-02 NOTE — Interval H&P Note (Signed)
History and Physical Interval Note: ? ?01/02/2022 ?9:35 AM ? ?Erik Rose  has presented today for surgery, with the diagnosis of Atherosclerosis of native arteries of extremities with rest pain, right leg.  The various methods of treatment have been discussed with the patient and family. After consideration of risks, benefits and other options for treatment, the patient has consented to  Procedure(s): ?RIGHT FEMORAL-PERONEAL BYPASS GRAFT (Right) as a surgical intervention.  The patient's history has been reviewed, patient examined, no change in status, stable for surgery.  I have reviewed the patient's chart and labs.  Questions were answered to the patient's satisfaction.   ? ? ?[name] ? ? ?

## 2022-01-02 NOTE — Progress Notes (Signed)
Patient arrived to 4e - 12. Md Hawken aware of pulses in rll after patient arriving to 4 east. ?

## 2022-01-03 ENCOUNTER — Encounter (HOSPITAL_COMMUNITY): Payer: Self-pay | Admitting: Vascular Surgery

## 2022-01-03 LAB — BASIC METABOLIC PANEL
Anion gap: 9 (ref 5–15)
BUN: 15 mg/dL (ref 8–23)
CO2: 21 mmol/L — ABNORMAL LOW (ref 22–32)
Calcium: 8.2 mg/dL — ABNORMAL LOW (ref 8.9–10.3)
Chloride: 107 mmol/L (ref 98–111)
Creatinine, Ser: 0.94 mg/dL (ref 0.61–1.24)
GFR, Estimated: >60 mL/min (ref >60)
Glucose, Bld: 127 mg/dL — ABNORMAL HIGH (ref 70–99)
Potassium: 3.9 mmol/L (ref 3.5–5.1)
Sodium: 137 mmol/L (ref 135–145)

## 2022-01-03 LAB — CBC
HCT: 24.5 % — ABNORMAL LOW (ref 39.0–52.0)
Hemoglobin: 8.4 g/dL — ABNORMAL LOW (ref 13.0–17.0)
MCH: 29.1 pg (ref 26.0–34.0)
MCHC: 34.3 g/dL (ref 30.0–36.0)
MCV: 84.8 fL (ref 80.0–100.0)
Platelets: 206 10*3/uL (ref 150–400)
RBC: 2.89 MIL/uL — ABNORMAL LOW (ref 4.22–5.81)
RDW: 14.5 % (ref 11.5–15.5)
WBC: 17.2 10*3/uL — ABNORMAL HIGH (ref 4.0–10.5)
nRBC: 0 % (ref 0.0–0.2)

## 2022-01-03 LAB — GLUCOSE, CAPILLARY
Glucose-Capillary: 132 mg/dL — ABNORMAL HIGH (ref 70–99)
Glucose-Capillary: 221 mg/dL — ABNORMAL HIGH (ref 70–99)
Glucose-Capillary: 205 mg/dL — ABNORMAL HIGH (ref 70–99)
Glucose-Capillary: 201 mg/dL — ABNORMAL HIGH (ref 70–99)

## 2022-01-03 LAB — LIPID PANEL
Cholesterol: 97 mg/dL (ref 0–200)
HDL: 38 mg/dL — ABNORMAL LOW (ref >40)
LDL Cholesterol: 46 mg/dL (ref 0–99)
Total CHOL/HDL Ratio: 2.6 RATIO
Triglycerides: 66 mg/dL (ref <150)
VLDL: 13 mg/dL (ref 0–40)

## 2022-01-03 NOTE — Evaluation (Signed)
Physical Therapy Evaluation ?Patient Details ?Name: Erik Rose ?MRN: [id number] ?DOB: January 02, 1956 ?Today's Date: 01/03/2022 ? ?History of Present Illness ? 66 yo s/p R femoral peroneal bypass graft secondary to arterosclerosis/PAD. PMH: DM, heart disease, HTN, stroke, TIA, s/p coronary angioplasty with stetn placement.  ?Clinical Impression ? Pt was seen for mobility on RW with assistance to get to BR and to end of hallway.  Pt is motivated to get home and demonstrates a good effort for all movement, including to get down the hallway for a longer trip.  Will recommend that he will possibly need a CNA to assist him with some self care initially, so will continue to work on independent movement but also to be aware of the need.  Pt is not able to get his LLE up to get a shoe or sock on and OT will address this as well.  With his independent living situation, recommend HHPT for strengthening, balance skills and  recovery of safety with independence for gait. ?   ? ?Recommendations for follow up therapy are one component of a multi-disciplinary discharge planning process, led by the attending physician.  Recommendations may be updated based on patient status, additional functional criteria and insurance authorization. ? ?Follow Up Recommendations Home health PT ? ?  ?Assistance Recommended at Discharge Intermittent Supervision/Assistance  ?Patient can return home with the following ? A little help with walking and/or transfers;A little help with bathing/dressing/bathroom;Assistance with cooking/housework;Assist for transportation;Help with stairs or ramp for entrance ? ?  ?Equipment Recommendations Rolling walker (2 wheels)  ?Recommendations for Other Services ?    ?  ?Functional Status Assessment Patient has had a recent decline in their functional status and demonstrates the ability to make significant improvements in function in a reasonable and predictable amount of time.  ? ?  ?Precautions / Restrictions  Precautions ?Precautions: Fall ?Precaution Comments: monitor vitals ?Restrictions ?Weight Bearing Restrictions: No  ? ?  ? ?Mobility ? Bed Mobility ?Overal bed mobility: Modified Independent ?  ?  ?  ?  ?  ?  ?  ?  ? ?Transfers ?Overall transfer level: Needs assistance ?Equipment used: Rolling walker (2 wheels) ?Transfers: Sit to/from Stand ?Sit to Stand: Min guard ?  ?  ?  ?  ?  ?General transfer comment: min guard for safety as pt is mildly unsteady without RW ?  ? ?Ambulation/Gait ?Ambulation/Gait assistance: Min guard ?Gait Distance (Feet): 120 Feet ?Assistive device: Rolling walker (2 wheels) ?Gait Pattern/deviations: Step-through pattern, Decreased stride length, Wide base of support ?Gait velocity: reduced ?Gait velocity interpretation: <1.31 ft/sec, indicative of household ambulator ?  ?General Gait Details: pt is walking with wide base and slow wide turns, limited tolerance for WB on RLE with slow progression on the hallway ? ?Stairs ?  ?  ?  ?  ?  ? ?Wheelchair Mobility ?  ? ?Modified Rankin (Stroke Patients Only) ?  ? ?  ? ?Balance Overall balance assessment: Needs assistance ?Sitting-balance support: Feet supported ?Sitting balance-Leahy Scale: Good ?  ?  ?Standing balance support: Bilateral upper extremity supported, During functional activity ?Standing balance-Leahy Scale: Fair ?Standing balance comment: less than fair dynamically ?  ?  ?  ?  ?  ?  ?  ?  ?  ?  ?  ?   ? ? ? ?Pertinent Vitals/Pain Pain Assessment ?Pain Assessment: Faces ?Faces Pain Scale: Hurts little more ?Pain Location: RLE; groin ?Pain Descriptors / Indicators: Discomfort, Aching, Operative site guarding ?Pain Intervention(s): Limited activity within patient's  tolerance, Premedicated before session, Monitored during session, Repositioned  ? ? ?Home Living Family/patient expects to be discharged to:: Private residence ?Living Arrangements: Alone ?Available Help at Discharge: Family;Friend(s) ?Type of Home: Apartment ?Home Access:  Stairs to enter ?Entrance Stairs-Rails: Can reach both ?Entrance Stairs-Number of Steps: 5 from parking lot to apt ?  ?Home Layout: One level ?Home Equipment: Gilmer Mor - quad;Cane - single point ?Additional Comments: pt is  ?  ?Prior Function Prior Level of Function : Independent/Modified Independent ?  ?  ?  ?  ?  ?  ?Mobility Comments: no AD needed but has SPC and quad cane used previously ?  ?  ? ? ?Hand Dominance  ? Dominant Hand: Left ? ?  ?Extremity/Trunk Assessment  ? Upper Extremity Assessment ?Upper Extremity Assessment: Defer to OT evaluation ?  ? ?Lower Extremity Assessment ?Lower Extremity Assessment: RLE deficits/detail ?RLE Deficits / Details: strength loss and unable to bring RLE up to cross over and don clothing but with RW can support for gait ?RLE: Unable to fully assess due to pain ?RLE Coordination: decreased gross motor ?  ? 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 Communication: No difficulties  ?Cognition Arousal/Alertness: Awake/alert ?Behavior During Therapy: [facility] for tasks assessed/performed ?Overall Cognitive Status: Within Functional Limits for tasks assessed ?  ?  ?  ?  ?  ?  ?  ?  ?  ?  ?  ?  ?  ?  ?  ?  ?  ?  ?  ? ?  ?General Comments General comments (skin integrity, edema, etc.): pt is in pain and requires help to maneuver on the hall and in room, and noted his balance benefited by having RW ? ?  ?Exercises    ? ?Assessment/Plan  ?  ?PT Assessment Patient needs continued PT services  ?PT Problem List Decreased strength;Decreased range of motion;Decreased activity tolerance;Decreased balance;Decreased mobility;Decreased knowledge of use of DME;Decreased coordination;Decreased skin integrity;Pain ? ?   ?  ?PT Treatment Interventions DME instruction;Gait training;Functional mobility training;Therapeutic activities;Therapeutic exercise;Balance training;Neuromuscular re-education;Patient/family  education;Stair training   ? ?PT Goals (Current goals can be found in the Care Plan section)  ?Acute Rehab PT Goals ?Patient Stated Goal: to get help forhome ?PT Goal Formulation: With patient ?Time For Goal Achievement: 01/10/22 ?Potential to Achieve Goals: Good ? ?  ?Frequency Min 3X/week ?  ? ? ?Co-evaluation   ?  ?  ?  ?  ? ? ?  ?AM-PAC PT "6 Clicks" Mobility  ?Outcome Measure Help needed turning from your back to your side while in a flat bed without using bedrails?: A Little ?Help needed moving from lying on your back to sitting on the side of a flat bed without using bedrails?: A Little ?Help needed moving to and from a bed to a chair (including a wheelchair)?: A Little ?Help needed standing up from a chair using your arms (e.g., wheelchair or bedside chair)?: A Little ?Help needed to walk in hospital room?: A Little ?Help needed climbing 3-5 steps with a railing? : A Little ?6 Click Score: 18 ? ?  ?End of Session Equipment Utilized During Treatment: Gait belt ?Activity Tolerance: Patient tolerated treatment well ?Patient left: in bed;with call bell/phone within reach;with family/visitor present ?Nurse Communication: Mobility status ?PT Visit Diagnosis: Unsteadiness on feet (R26.81);Muscle weakness (generalized) (M62.81);Pain ?Pain - Right/Left: Right ?Pain - part of body: Leg;Ankle and joints of foot ?  ? ?Time:  [id number] ?PT Time Calculation (min) (ACUTE ONLY): 30 min ? ? ?Charges:   PT Evaluation ?$PT Eval Moderate Complexity: 1 Mod ?PT Treatments ?$Gait Training: 8-22 mins ?  ?   ? ?Ivar Drapeuth E Hyatt Capobianco ?01/03/2022, 1:42 PM ? ?Samul Dadauth Khalon Cansler, PT PhD ?[facility] [phone] and [facility] [phone] ? ? ?

## 2022-01-03 NOTE — Evaluation (Signed)
Occupational Therapy Evaluation ?Patient Details ?Name: Erik Rose ?MRN: [id number] ?DOB: 10-17-1955 ?Today's Date: 01/03/2022 ? ? ?History of Present Illness 65 yo s/p R femoral peroneal bypass graft secondary to arterosclerosis/PAD. PMH: DM, heart disease, HTN, stroke, TIA, s/p coronary angioplasty with stetn placement.  ? ?Clinical Impression ?  ?PTA pt lives alone independently. Ambulated @ 80 ft @ RW level and requires min A with LB ADL. Acute OT to follow to maximize independence with LB ADL and facilitate a safe DC home. Pt states family/friends will be able to assist PRN after DC.   ?   ? ?Recommendations for follow up therapy are one component of a multi-disciplinary discharge planning process, led by the attending physician.  Recommendations may be updated based on patient status, additional functional criteria and insurance authorization.  ? ?Follow Up Recommendations ? No OT follow up  ?  ?Assistance Recommended at Discharge Intermittent Supervision/Assistance  ?Patient can return home with the following A little help with bathing/dressing/bathroom;Assistance with cooking/housework;Assist for transportation ? ?  ?Functional Status Assessment ? Patient has had a recent decline in their functional status and demonstrates the ability to make significant improvements in function in a reasonable and predictable amount of time.  ?Equipment Recommendations ? None recommended by OT  ?  ?Recommendations for Other Services   ? ? ?  ?Precautions / Restrictions Precautions ?Precautions: Fall  ? ?  ? ?Mobility Bed Mobility ?Overal bed mobility: Modified Independent ?  ?  ?  ?  ?  ?  ?  ?  ? ?Transfers ?Overall transfer level: Needs assistance ?Equipment used: Rolling walker (2 wheels) ?Transfers: Sit to/from Stand ?Sit to Stand: Min guard ?  ?  ?  ?  ?  ?  ?  ? ?  ?Balance Overall balance assessment: Mild deficits observed, not formally tested ?  ?  ?  ?  ?  ?  ?  ?  ?  ?  ?  ?  ?  ?  ?  ?  ?  ?  ?   ? ?ADL  either performed or assessed with clinical judgement  ? ?ADL Overall ADL's : Needs assistance/impaired ?  ?  ?Grooming: Modified independent ?  ?Upper Body Bathing: Set up ?  ?Lower Body Bathing: Minimal assistance ?  ?Upper Body Dressing : Set up ?  ?Lower Body Dressing: Minimal assistance ?  ?Toilet Transfer: Min guard ?  ?Toileting- Architect and Hygiene: Min guard ?  ?  ?  ?Functional mobility during ADLs: Min guard;Rolling walker (2 wheels);Cueing for sequencing ?General ADL Comments: Difficulty reaching foot due to pain; educated on figure four positioning; states he can most likely borrow a shower chair  ? ? ? ?Vision Baseline Vision/History:  (Diabetic macular edema; has functional vision;in trial for diabetic retinopathy) ?   ?   ?Perception   ?  ?Praxis   ?  ? ?Pertinent Vitals/Pain Pain Assessment ?Pain Assessment: 0-10 ?Pain Score: 6  ?Pain Location: RLE; groin ?Pain Descriptors / Indicators: Discomfort, Aching ?Pain Intervention(s): Limited activity within patient's tolerance  ? ? ? ?Hand Dominance Left ?  ?Extremity/Trunk Assessment Upper Extremity Assessment ?Upper Extremity Assessment: Overall WFL for tasks assessed;RUE deficits/detail ?RUE Deficits / Details: R hand constant numbness for a couple of years; adapts to it ?  ?Lower Extremity Assessment ?Lower Extremity Assessment: Defer to PT evaluation ?  ?Cervical / Trunk Assessment ?Cervical / Trunk Assessment: Other exceptions (hx of neck problems;) ?  ?Communication Communication ?Communication: No difficulties ?  ?  Cognition Arousal/Alertness: Awake/alert ?Behavior During Therapy: [facility] for tasks assessed/performed ?Overall Cognitive Status: Within Functional Limits for tasks assessed ?  ?  ?  ?  ?  ?  ?  ?  ?  ?  ?  ?  ?  ?  ?  ?  ?  ?  ?  ?General Comments  RLE post op bandage ? ?  ?Exercises Exercises: Other exercises ?Other Exercises ?Other Exercises: heel cord/hamstring stretch using sheet; hold x 20 seconds ?  ?Shoulder  Instructions    ? ? ?Home Living Family/patient expects to be discharged to:: Private residence ?Living Arrangements: Alone ?Available Help at Discharge: Family;Friend(s) ?Type of Home: Apartment ?Home Access: Stairs to enter ?Entrance Stairs-Number of Steps: 5 from parking lot to apt ?  ?Home Layout: One level (1rst floor apt) ?  ?  ?Bathroom Shower/Tub: Tub/shower unit;Curtain ?  ?Bathroom Toilet: Standard ?Bathroom Accessibility: Yes ?How Accessible: Accessible via walker ?Home Equipment: Gilmer Mor - quad;Cane - single point ?  ?  ?  ? ?  ?Prior Functioning/Environment Prior Level of Function : Independent/Modified Independent;Driving (retired form [employer]) ?  ?  ?  ?  ?  ?  ?  ?  ?  ? ?  ?  ?OT Problem List: Decreased range of motion;Decreased activity tolerance;Impaired balance (sitting and/or standing);Decreased safety awareness;Decreased knowledge of use of DME or AE;Pain ?  ?   ?OT Treatment/Interventions: Self-care/ADL training;DME and/or AE instruction;Therapeutic activities;Patient/family education  ?  ?OT Goals(Current goals can be found in the care plan section) Acute Rehab OT Goals ?Patient Stated Goal: to move better and go home ?OT Goal Formulation: With patient ?Time For Goal Achievement: 01/17/22 ?Potential to Achieve Goals: Good  ?OT Frequency: Min 2X/week ?  ? ?Co-evaluation   ?  ?  ?  ?  ? ?  ?AM-PAC OT "6 Clicks" Daily Activity     ?Outcome Measure Help from another person eating meals?: None ?Help from another person taking care of personal grooming?: None ?Help from another person toileting, which includes using toliet, bedpan, or urinal?: A Little ?Help from another person bathing (including washing, rinsing, drying)?: A Little ?Help from another person to put on and taking off regular upper body clothing?: None ?Help from another person to put on and taking off regular lower body clothing?: A Little ?6 Click Score: 21 ?  ?End of Session Equipment Utilized During Treatment: Rolling walker (2  wheels) ?Nurse Communication: Mobility status ? ?Activity Tolerance: Patient tolerated treatment well ?Patient left: in chair;with call bell/phone within reach ? ?OT Visit Diagnosis: Unsteadiness on feet (R26.81);Muscle weakness (generalized) (M62.81);Pain ?Pain - Right/Left: Right ?Pain - part of body: Leg (groin)  ?              ?Time: 9702-6378 ?OT Time Calculation (min): 37 min ?Charges:  OT General Charges ?$OT Visit: 1 Visit ?OT Evaluation ?$OT Eval Moderate Complexity: 1 Mod ?OT Treatments ?$Self Care/Home Management : 8-22 mins ? ?[facility], OT/L  ? ?Acute OT Clinical Specialist ?Acute Rehabilitation Services ?Pager [phone] ?Office [phone]  ? ?Amdrew Oboyle,HILLARY ?01/03/2022, 10:41 AM ?

## 2022-01-03 NOTE — Progress Notes (Addendum)
[facility] [place] ? ?Subjective  - Doing well without much discomfort. ? ? ?Objective ?(!) 157/81 ?99 ?98.2 ?F (36.8 ?C) (Oral) ?19 ?100% ? ?Intake/Output Summary (Last 24 hours) at 01/03/2022 0756 ?Last data filed at 01/03/2022 0553 ?Gross per 24 hour  ?Intake 3000 ml  ?Output 1900 ml  ?Net 1100 ml  ? ? ? ? ? ? ? ? ?Doppler DP right LE ?Incision healing well, dry bloody drainage on old dressing ?Minimal spotting bloody drainage with dressing change ?Groin soft without hematoma or erythema (picture with shadows) ?Motor of toes and ankle intact ?Lungs non labored breathing ?Heart RRR ? ?Assessment/Planning: ?PROCEDURE:   ?1) right common femoral to peroneal bypass with in-situ greater saphenous vein ?2) right lower extremity angiogram ? ?Good inflow with DP doppler right LE ?Edema in the LE into the foot.  Demonstrated proper elevation to patient ?Pending mobility and pain control prior to discharge ?Afebrile with leukocytosis of 17.2 will order IS to bedside and will ck labs in the am. ?HGB 8.4 acute blood anemia EBL recorded @325  ml.  He is asymptomatic moving from a supine to seated position will observe for now. ?Cont. ASA and Lipitor.  Metformin on hold due to contrast use.  SSI ordered placed. ?PT/OT ordered for mobility. ?Started Heparin SQ for DVT prophylaxis  ? ? ? ? ?01/03/2022 ?7:56 AM ?-- ? ?Laboratory ?Lab Results: ?Recent Labs  ?  01/02/22 ?0812 01/03/22 ?0500  ?WBC 10.2 17.2*  ?HGB 11.8* 8.4*  ?HCT 36.9* 24.5*  ?PLT 259 206  ? ?BMET ?Recent Labs  ?  01/02/22 ?0812 01/03/22 ?0500  ?NA 136 137  ?K 4.0 3.9  ?CL 107 107  ?CO2 21* 21*  ?GLUCOSE 198* 127*  ?BUN 14 15  ?CREATININE 0.85 0.94  ?CALCIUM 8.9 8.2*  ? ? ?COAG ?Lab Results  ?Component Value Date  ? INR 0.9 01/02/2022  ? ?No results found for: PTT ? ?VASCULAR STAFF ADDENDUM: ?I have independently interviewed and examined the patient. ?I agree with the above.  ?Looks great POD#1 RCFA-peroneal bypass with in  situ vein. ?Brisk doppler flow in AT/DP ?Incisions as above. ?Mobilize as able. ? ?01/04/2022. Rande Brunt, MD ?[facility] [place] ?Office Phone Number: [phone] ?01/03/2022 12:04 PM ? ? ? ?

## 2022-01-04 ENCOUNTER — Ambulatory Visit: Payer: Medicare PPO | Admitting: Physical Therapy

## 2022-01-04 LAB — GLUCOSE, CAPILLARY
Glucose-Capillary: 163 mg/dL — ABNORMAL HIGH (ref 70–99)
Glucose-Capillary: 186 mg/dL — ABNORMAL HIGH (ref 70–99)
Glucose-Capillary: 224 mg/dL — ABNORMAL HIGH (ref 70–99)
Glucose-Capillary: 198 mg/dL — ABNORMAL HIGH (ref 70–99)
Glucose-Capillary: 169 mg/dL — ABNORMAL HIGH (ref 70–99)

## 2022-01-04 LAB — BASIC METABOLIC PANEL
Anion gap: 8 (ref 5–15)
BUN: 15 mg/dL (ref 8–23)
CO2: 22 mmol/L (ref 22–32)
Calcium: 8.2 mg/dL — ABNORMAL LOW (ref 8.9–10.3)
Chloride: 104 mmol/L (ref 98–111)
Creatinine, Ser: 0.97 mg/dL (ref 0.61–1.24)
GFR, Estimated: >60 mL/min (ref >60)
Glucose, Bld: 166 mg/dL — ABNORMAL HIGH (ref 70–99)
Potassium: 3.5 mmol/L (ref 3.5–5.1)
Sodium: 134 mmol/L — ABNORMAL LOW (ref 135–145)

## 2022-01-04 LAB — CBC
HCT: 23.1 % — ABNORMAL LOW (ref 39.0–52.0)
Hemoglobin: 7.6 g/dL — ABNORMAL LOW (ref 13.0–17.0)
MCH: 28.4 pg (ref 26.0–34.0)
MCHC: 32.9 g/dL (ref 30.0–36.0)
MCV: 86.2 fL (ref 80.0–100.0)
Platelets: 207 10*3/uL (ref 150–400)
RBC: 2.68 MIL/uL — ABNORMAL LOW (ref 4.22–5.81)
RDW: 14.6 % (ref 11.5–15.5)
WBC: 16.5 10*3/uL — ABNORMAL HIGH (ref 4.0–10.5)
nRBC: 0 % (ref 0.0–0.2)

## 2022-01-04 NOTE — Progress Notes (Signed)
PHARMACIST LIPID MONITORING ? ? ?Erik Rose is a 66 y.o. male admitted on 01/02/2022 with atherosclerosis of native arteries.  Pharmacy has been consulted to optimize lipid-lowering therapy with the indication of secondary prevention for clinical ASCVD. ? ?Recent Labs: ? ?Lipid Panel (last 6 months):   ?Lab Results  ?Component Value Date  ? CHOL 97 01/03/2022  ? TRIG 66 01/03/2022  ? HDL 38 (L) 01/03/2022  ? CHOLHDL 2.6 01/03/2022  ? VLDL 13 01/03/2022  ? LDLCALC 46 01/03/2022  ? ? ?Hepatic function panel (last 6 months):   ?Lab Results  ?Component Value Date  ? AST 25 01/02/2022  ? ALT 37 01/02/2022  ? ALKPHOS 87 01/02/2022  ? BILITOT 0.6 01/02/2022  ? ? ?SCr (since admission):   ?Serum creatinine: 0.97 mg/dL 94/17/40 8144 ?Estimated creatinine clearance: 90.1 mL/min ? ?Current therapy and lipid therapy tolerance ?Current lipid-lowering therapy: Lipitor 80 mg daily ? ?Assessment:   ?No changes in lipid-lowering therapy at this time due to low LDL and already on high intensity statin ? ?Plan:   ? ?1.Statin intensity (high intensity recommended for all patients regardless of the LDL):  No statin changes. The patient is already on a high intensity statin. ? ?2.Follow-up with:  Primary care provider - Everrett Coombe, DO ? ?3.Follow-up labs after discharge:  No changes in lipid therapy, repeat a lipid panel in one year.    ? ? ? ?Thank you for allowing [place] to participate in this patients care. ?Signe Colt, PharmD ?01/04/2022 10:16 AM ? ?**Pharmacist phone directory can be found on [email or web address] listed under [facility]** ? ?

## 2022-01-04 NOTE — TOC Transition Note (Addendum)
Transition of Care (TOC) - CM/SW Discharge Note ?Marvetta Gibbons Therapist, sports, BSN ?Transitions of Care ?Unit 4E- RN Case Manager ?See Treatment Team for direct phone #  ? ? ?Patient Details  ?Name: Erik Rose ?MRN: [id number] ?Date of Birth: 01-21-1956 ? ?Transition of Care (TOC) CM/SW Contact:  ?Dahlia Client, Romeo Rabon, RN ?Phone Number: ?01/04/2022, 3:14 PM ? ? ?Clinical Narrative:    ?Pt from home, has family support. Daughter Maximiano Coss called to speak with CM regarding private duty assistance in the home- questions answered.  ?CM spoke with pt at bedside for [facility] needs- list provided for [facility] choice- per pt he has selected [facility] as first choice. Per pt he will have wife (they are separated but remain friends) and son to assist. Says his daughter Maximiano Coss if working on finding additional help which he is agreeable to short term.  ?Pt does endorse that he will need a RW for home. Agreeable to in house provider- will ask for DME order.  ? ?Address, phone # and PCP all confirmed with pt in epic.  ? ?Call made to [facility] with [facility] for [facility] referral- referral has been accepted.   ? ?Call made to [facility] w/ [employer] for DME- rolling walker to be delivered to room prior to discharge.  ? ? ?Final next level of care: [place] ?Barriers to Discharge: Continued Medical Work up ? ? ?Patient Goals and CMS Choice ?Patient states their goals for this hospitalization and ongoing recovery are:: return home ?CMS Medicare.gov Compare Post Acute Care list provided to:: Patient ?Choice offered to / list presented to : Patient ? ?Discharge Placement ?  ?           ? Home w/ HH ?  ?  ?  ? ?Discharge Plan and Services ?  ?Discharge Planning Services: CM Consult ?Post Acute Care Choice: Home Health, Durable Medical Equipment          ?DME Arranged: Walker rolling ?DME Agency: [employer] ?Date DME Agency Contacted: 01/04/22 ?Time DME Agency Contacted: [phone] ?Representative spoke with at DME Agency: Freda Munro ?HH Arranged: PT ?[place] Agency:  [place] ?Date HH Agency Contacted: 01/04/22 ?Time [place]: 1512 ?Representative spoke with at [place]: Tommi Rumps ? ?Social Determinants of Health (SDOH) Interventions ?  ? ? ?Readmission Risk Interventions ? ?  01/04/2022  ?  3:14 PM  ?Readmission Risk Prevention Plan  ?Post Dischage Appt Complete  ?Medication Screening Complete  ?Transportation Screening Complete  ? ? ? ? ? ?

## 2022-01-04 NOTE — Progress Notes (Signed)
?  Progress Note ? ? ? ?01/04/2022 ?7:42 AM ?2 Days Post-Op ? ?Subjective:  R foot feels better compared to before surgery ? ? ?Vitals:  ? 01/04/22 0626 01/04/22 0733  ?BP: (!) 152/74 (!) 152/74  ?Pulse:    ?Resp:    ?Temp:  99.8 ?F (37.7 ?C)  ?SpO2:    ? ?Physical Exam: ?Lungs:  non labored ?Incisions:  RLE incisions c/d/i ?Extremities:  brisk R DP by doppler; R leg edema to the level of the knee ?Neurologic: A&O ? ?CBC ?   ?Component Value Date/Time  ? WBC 16.5 (H) 01/04/2022 0140  ? RBC 2.68 (L) 01/04/2022 0140  ? HGB 7.6 (L) 01/04/2022 0140  ? HCT 23.1 (L) 01/04/2022 0140  ? PLT 207 01/04/2022 0140  ? MCV 86.2 01/04/2022 0140  ? MCH 28.4 01/04/2022 0140  ? MCHC 32.9 01/04/2022 0140  ? RDW 14.6 01/04/2022 0140  ? LYMPHSABS 2,603 11/15/2021 0000  ? EOSABS 257 11/15/2021 0000  ? BASOSABS 67 11/15/2021 0000  ? ? ?BMET ?   ?Component Value Date/Time  ? NA 134 (L) 01/04/2022 0140  ? K 3.5 01/04/2022 0140  ? CL 104 01/04/2022 0140  ? CO2 22 01/04/2022 0140  ? GLUCOSE 166 (H) 01/04/2022 0140  ? BUN 15 01/04/2022 0140  ? CREATININE 0.97 01/04/2022 0140  ? CREATININE 0.88 11/15/2021 0000  ? CALCIUM 8.2 (L) 01/04/2022 0140  ? GFRNONAA >60 01/04/2022 0140  ? GFRNONAA 88 01/18/2020 1051  ? GFRAA 102 01/18/2020 1051  ? ? ?INR ?   ?Component Value Date/Time  ? INR 0.9 01/02/2022 5329  ? ? ? ?Intake/Output Summary (Last 24 hours) at 01/04/2022 0742 ?Last data filed at 01/04/2022 0340 ?Gross per 24 hour  ?Intake 52.45 ml  ?Output 1200 ml  ?Net -1147.55 ml  ? ? ? ?Assessment/Plan:  66 y.o. male is s/p R femoral to peroneal bypass with vein 2 Days Post-Op  ? ?Patent bypass with brisk signal over bypass by doppler and brisk DP on the foot ?RLE edema; continue ACE wrap and elevation of leg when in bed ?PT recommending HH: TOC consulted to arrange ?ABL anemia: 7.6 today; may need 1u pRBC prior to d/c home ?Home likely tomorrow ? ? ?Emilie Rutter, PA-C ?[facility] ?[phone] ?01/04/2022 ?7:42 AM ? ? ? ?

## 2022-01-04 NOTE — Progress Notes (Signed)
Physical Therapy Treatment ?Patient Details ?Name: Erik Rose ?MRN: [id number] ?DOB: 02/22/1956 ?Today's Date: 01/04/2022 ? ? ?History of Present Illness 66 yo s/p R femoral peroneal bypass graft secondary to arterosclerosis/PAD. PMH: DM, heart disease, HTN, stroke, TIA, s/p coronary angioplasty with stetn placement. ? ?  ?PT Comments  ? ? Pt received seated EOB, agreeable to therapy session, reports increased fatigue today and seeming mildly confused -see cognition. Emphasis on transfer training and seated/standing balance as pt reports RLE too painful for gait progression. Of note, pt needing greatly increased physical assist for standing transfers (modA) compared with previous session and with poor balance during pivotal steps to recliner chair and pt unable to progress gait safely, will continue to assess disposition next session pending progress, pt will need to perform multiple curb steps from parking lot at his apt and progress to household distance ambulation in order to DC home. Pt continues to benefit from PT services to progress toward functional mobility goals.   ?Recommendations for follow up therapy are one component of a multi-disciplinary discharge planning process, led by the attending physician.  Recommendations may be updated based on patient status, additional functional criteria and insurance authorization. ? ?Follow Up Recommendations ? Home health PT (pending progress) ?  ?  ?Assistance Recommended at Discharge Intermittent Supervision/Assistance  ?Patient can return home with the following A little help with walking and/or transfers;Assistance with cooking/housework;Assist for transportation;Help with stairs or ramp for entrance;Two people to help with walking and/or transfers;A little help with bathing/dressing/bathroom ?  ?Equipment Recommendations ? Rolling walker (2 wheels)  ?  ?Recommendations for Other Services   ? ? ?  ?Precautions / Restrictions Precautions ?Precautions:  Fall ?Precaution Comments: check orthostatics? pt impulsive to sit ?Restrictions ?Weight Bearing Restrictions: No  ?  ? ?Mobility ? Bed Mobility ?  ?  ?  ?  ?  ?  ?  ?General bed mobility comments: pt received seated EOB, using bed rail x1 occasion for supine to upright transition after he leaned back. ?  ? ?Transfers ?Overall transfer level: Needs assistance ?Equipment used: Rolling walker (2 wheels) ?Transfers: Sit to/from Stand, Bed to chair/wheelchair/BSC ?Sit to Stand: Mod assist ?Stand pivot transfers: Mod assist ?  ?  ?  ?  ?General transfer comment: from EOB x2 trials, upon standing initially pt standing <1 minute and c/o "feeling weak" and sat suddenly. Pt used momentum strategy each attempt and heavy modA to reach upright; modA for stand>sit due to poor eccentric control/needs cues for hand placement. ?  ? ?Ambulation/Gait ?  ?  ?  ?  ?  ?  ?  ?General Gait Details: unsafe to attempt due to pt high pain score and fatigues after pivot to chair. will need +2 chair follow to progress gait. ? ? ?  ?Balance Overall balance assessment: Needs assistance ?Sitting-balance support: Feet supported ?Sitting balance-Leahy Scale: Fair ?Sitting balance - Comments: supervision with U UE support, min guard with no UE support ?  ?Standing balance support: Reliant on assistive device for balance ?Standing balance-Leahy Scale: Poor ?Standing balance comment: lateral LOB to R and L sides standing with RW, needs constant external support for safety even with static standing ?  ?  ?  ?  ?  ?  ?  ?  ?  ?  ?  ?  ? ?  ?Cognition Arousal/Alertness: Awake/alert ?Behavior During Therapy: [facility] for tasks assessed/performed ?Overall Cognitive Status: Within Functional Limits for tasks assessed ?  ?  ?  ?  ?  ?  ?  ?  ?  ?  ?  ?  ?  ?  ?  ?  ?  General Comments: possible mild confusion, pt oriented to self/situation/location but seemed surprised by groin incision and much weaker/impulsive to sit today compared with eval. ?  ?  ? ?   ?Exercises   ? ?  ?General Comments General comments (skin integrity, edema, etc.): HR 100-108 bpm seated EOB and with mobility, RR 18 seated and up to 35 with stand pivot transfer; pt denies dizziness but c/o significant fatigue after minimal exertion, may need to check orthostatics next session if still fatigued and pt may get blood products per MD note? ?  ?  ? ?Pertinent Vitals/Pain Pain Assessment ?Pain Assessment: 0-10 ?Pain Score: 8  ?Faces Pain Scale: Hurts little more ?Pain Location: RLE (calf the worst); groin ?Pain Descriptors / Indicators: Discomfort, Aching, Operative site guarding ?Pain Intervention(s): Limited activity within patient's tolerance, Monitored during session, Repositioned, Patient requesting pain meds-RN notified  ? ? ? ?PT Goals (current goals can now be found in the care plan section) Acute Rehab PT Goals ?Patient Stated Goal: to get help for home ?PT Goal Formulation: With patient ?Time For Goal Achievement: 01/10/22 ?Progress towards PT goals: Progressing toward goals ? ?  ?Frequency ? ? ? Min 3X/week ? ? ? ?  ?PT Plan Current plan remains appropriate (pending progress)  ? ? ?   ?AM-PAC PT "6 Clicks" Mobility   ?Outcome Measure ? Help needed turning from your back to your side while in a flat bed without using bedrails?: A Little ?Help needed moving from lying on your back to sitting on the side of a flat bed without using bedrails?: A Little ?Help needed moving to and from a bed to a chair (including a wheelchair)?: A Lot ?Help needed standing up from a chair using your arms (e.g., wheelchair or bedside chair)?: A Lot ?Help needed to walk in hospital room?: Total ?Help needed climbing 3-5 steps with a railing? : Total ?6 Click Score: 12 ? ?  ?End of Session Equipment Utilized During Treatment: Gait belt ?Activity Tolerance: Patient limited by fatigue;Patient limited by pain ?Patient left: with call bell/phone within reach;in chair;with chair alarm set ?Nurse Communication: Mobility  status;Other (comment);Patient requests pain meds (mild confusion) ?PT Visit Diagnosis: Unsteadiness on feet (R26.81);Muscle weakness (generalized) (M62.81);Pain ?Pain - Right/Left: Right ?Pain - part of body: Leg;Ankle and joints of foot ?  ? ? ?Time: 9924-2683 ?PT Time Calculation (min) (ACUTE ONLY): 29 min ? ?Charges:  $Therapeutic Activity: 23-37 mins          ?          ? ?Erik Rose P., PTA ?Acute Rehabilitation Services ?Secure Chat Preferred 9a-5:30pm ?Office: [phone]  ? ? ?Erik Rose ?01/04/2022, 12:05 PM ? ?

## 2022-01-04 NOTE — Care Management Important Message (Signed)
Important Message ? ?Patient Details  ?Name: Erik Rose ?MRN: [id number] ?Date of Birth: 05-Jul-1956 ? ? ?Medicare Important Message Given:  Yes ? ? ? ? ?Shelda Altes ?01/04/2022, 8:39 AM ?

## 2022-01-05 ENCOUNTER — Encounter: Payer: Self-pay | Admitting: Family Medicine

## 2022-01-05 ENCOUNTER — Encounter: Payer: Self-pay | Admitting: Vascular Surgery

## 2022-01-05 LAB — CBC
HCT: 22.7 % — ABNORMAL LOW (ref 39.0–52.0)
Hemoglobin: 8 g/dL — ABNORMAL LOW (ref 13.0–17.0)
MCH: 30.4 pg (ref 26.0–34.0)
MCHC: 35.2 g/dL (ref 30.0–36.0)
MCV: 86.3 fL (ref 80.0–100.0)
Platelets: 213 10*3/uL (ref 150–400)
RBC: 2.63 MIL/uL — ABNORMAL LOW (ref 4.22–5.81)
RDW: 14.8 % (ref 11.5–15.5)
WBC: 19.6 10*3/uL — ABNORMAL HIGH (ref 4.0–10.5)
nRBC: 0 % (ref 0.0–0.2)

## 2022-01-05 LAB — BASIC METABOLIC PANEL
Anion gap: 9 (ref 5–15)
BUN: 16 mg/dL (ref 8–23)
CO2: 22 mmol/L (ref 22–32)
Calcium: 8.5 mg/dL — ABNORMAL LOW (ref 8.9–10.3)
Chloride: 105 mmol/L (ref 98–111)
Creatinine, Ser: 1.13 mg/dL (ref 0.61–1.24)
GFR, Estimated: >60 mL/min (ref >60)
Glucose, Bld: 177 mg/dL — ABNORMAL HIGH (ref 70–99)
Potassium: 3.6 mmol/L (ref 3.5–5.1)
Sodium: 136 mmol/L (ref 135–145)

## 2022-01-05 LAB — GLUCOSE, CAPILLARY: Glucose-Capillary: 179 mg/dL — ABNORMAL HIGH (ref 70–99)

## 2022-01-05 MED ORDER — OXYCODONE-ACETAMINOPHEN 5-325 MG PO TABS
1.0000 | ORAL_TABLET | Freq: Four times a day (QID) | ORAL | 0 refills | Status: DC | PRN
Start: 2022-01-05 — End: 2022-01-30

## 2022-01-05 NOTE — Progress Notes (Addendum)
?  Progress Note ? ? ? ?01/05/2022 ?10:15 AM ?3 Days Post-Op ? ?Subjective:  Ready for discharge home ? ? ?Vitals:  ? 01/05/22 0844 01/05/22 0847  ?BP: 136/73   ?Pulse: (!) 104 (!) 104  ?Resp: (!) 21 19  ?Temp: 99 ?F (37.2 ?C)   ?SpO2:    ? ?Physical Exam: ?Lungs:  non labored ?Incisions:  R leg incisions c/d/i ?Extremities:  DP brisk by doppler; dopplerable bypass signal at the knee ?Neurologic: A&O ? ?CBC ?   ?Component Value Date/Time  ? WBC 19.6 (H) 01/05/2022 0200  ? RBC 2.63 (L) 01/05/2022 0200  ? HGB 8.0 (L) 01/05/2022 0200  ? HCT 22.7 (L) 01/05/2022 0200  ? PLT 213 01/05/2022 0200  ? MCV 86.3 01/05/2022 0200  ? MCH 30.4 01/05/2022 0200  ? MCHC 35.2 01/05/2022 0200  ? RDW 14.8 01/05/2022 0200  ? LYMPHSABS 2,603 11/15/2021 0000  ? EOSABS 257 11/15/2021 0000  ? BASOSABS 67 11/15/2021 0000  ? ? ?BMET ?   ?Component Value Date/Time  ? NA 136 01/05/2022 0200  ? K 3.6 01/05/2022 0200  ? CL 105 01/05/2022 0200  ? CO2 22 01/05/2022 0200  ? GLUCOSE 177 (H) 01/05/2022 0200  ? BUN 16 01/05/2022 0200  ? CREATININE 1.13 01/05/2022 0200  ? CREATININE 0.88 11/15/2021 0000  ? CALCIUM 8.5 (L) 01/05/2022 0200  ? GFRNONAA >60 01/05/2022 0200  ? GFRNONAA 88 01/18/2020 1051  ? GFRAA 102 01/18/2020 1051  ? ? ?INR ?   ?Component Value Date/Time  ? INR 0.9 01/02/2022 0569  ? ? ? ?Intake/Output Summary (Last 24 hours) at 01/05/2022 1015 ?Last data filed at 01/05/2022 0909 ?Gross per 24 hour  ?Intake 250 ml  ?Output 1160 ml  ?Net -910 ml  ? ? ? ?Assessment/Plan:  66 y.o. male is s/p R femoral to peroneal bypass with in situ vein 3 Days Post-Op  ? ?R foot well perfused; incisions are well appearing ?Ok for discharge home today with [facility] PT ?Follow up in 2-3 weeks in office ? ? ?Emilie Rutter, PA-C ?[facility] ?[phone] ?01/05/2022 ?10:15 AM ? ?I have seen and evaluated the patient.  I agree with the plan as outlined by the PA above.  Plan for discharge home today. ? ?Durene Cal ? ?

## 2022-01-05 NOTE — Discharge Instructions (Signed)
 [facility]  Discharge instructions  Lower Extremity Bypass Surgery  Please refer to the following instruction for your post-procedure care. Your surgeon or physician assistant will discuss any changes with you.  Activity  You are encouraged to walk as much as you can. You can slowly return to normal activities during the month after your surgery. Avoid strenuous activity and heavy lifting until your doctor tells you it's OK. Avoid activities such as vacuuming or swinging a golf club. Do not drive until your doctor give the OK and you are no longer taking prescription pain medications. It is also normal to have difficulty with sleep habits, eating and bowel movement after surgery. These will go away with time.  Bathing/Showering  You may shower after you go home. Do not soak in a bathtub, hot tub, or swim until the incision heals completely.  Incision Care  Clean your incision with mild soap and water. Shower every day. Pat the area dry with a clean towel. You do not need a bandage unless otherwise instructed. Do not apply any ointments or creams to your incision. If you have open wounds you will be instructed how to care for them or a visiting nurse may be arranged for you. If you have staples or sutures along your incision they will be removed at your post-op appointment. You may have skin glue on your incision. Do not peel it off. It will come off on its own in about one week. If you have a great deal of moisture in your groin, use a gauze help keep this area dry.  Diet  Resume your normal diet. There are no special food restrictions following this procedure. A low fat/ low cholesterol diet is recommended for all patients with vascular disease. In order to heal from your surgery, it is CRITICAL to get adequate nutrition. Your body requires vitamins, minerals, and protein. Vegetables are the best source of vitamins and minerals. Vegetables also provide the  perfect balance of protein. Processed food has little nutritional value, so try to avoid this.  Medications  Resume taking all your medications unless your doctor or nurse practitioner tells you not to. If your incision is causing pain, you may take over-the-counter pain relievers such as acetaminophen (Tylenol). If you were prescribed a stronger pain medication, please aware these medication can cause nausea and constipation. Prevent nausea by taking the medication with a snack or meal. Avoid constipation by drinking plenty of fluids and eating foods with high amount of fiber, such as fruits, vegetables, and grains. Take Colase 100 mg (an over-the-counter stool softener) twice a day as needed for constipation. Do not take Tylenol if you are taking prescription pain medications.  Follow Up  Our office will schedule a follow up appointment 2-3 weeks following discharge.  Please call us immediately for any of the following conditions  Severe or worsening pain in your legs or feet while at rest or while walking Increase pain, redness, warmth, or drainage (pus) from your incision site(s) Fever of 101 degree or higher The swelling in your leg with the bypass suddenly worsens and becomes more painful than when you were in the hospital If you have been instructed to feel your graft pulse then you should do so every day. If you can no longer feel this pulse, call the office immediately. Not all patients are given this instruction.  Leg swelling is common after leg bypass surgery.  The swelling should improve over a few months   following surgery. To improve the swelling, you may elevate your legs above the level of your heart while you are sitting or resting. Your surgeon or physician assistant may ask you to apply an ACE wrap or wear compression (TED) stockings to help to reduce swelling.  Reduce your risk of vascular disease  Stop smoking. If you would like help call [facility] at 1-800-QUIT-NOW  (1-800-784-8669) or [facility] at 336-586-4000.  Manage your cholesterol Maintain a desired weight Control your diabetes weight Control your diabetes Keep your blood pressure down  If you have any questions, please call the office at [phone]   

## 2022-01-05 NOTE — Progress Notes (Signed)
Occupational Therapy Treatment ?Patient Details ?Name: Erik Rose ?MRN: [id number] ?DOB: 05/19/1956 ?Today's Date: 01/05/2022 ? ? ?History of present illness 66 yo s/p R femoral peroneal bypass graft secondary to arterosclerosis/PAD. PMH: DM, heart disease, HTN, stroke, TIA, s/p coronary angioplasty with stetn placement. ?  ?OT comments ? Pt. Seen for skilled OT treatment session.  Pt. Able to complete bed mobility without assistance.  Initial guidance when transitioning into standing but able to correct balance with cues.  In room ambulation with min guard a.  Continued education on benefits of smoking cessation for systemic health and healing.  Pt. Verbalizes interest and motivation for this.  States he plans on coordinating with his primary md.    ? ?Recommendations for follow up therapy are one component of a multi-disciplinary discharge planning process, led by the attending physician.  Recommendations may be updated based on patient status, additional functional criteria and insurance authorization. ?   ?Follow Up Recommendations ? No OT follow up  ?  ?Assistance Recommended at Discharge Intermittent Supervision/Assistance  ?Patient can return home with the following ? A little help with bathing/dressing/bathroom;Assistance with cooking/housework;Assist for transportation ?  ?Equipment Recommendations ? None recommended by OT  ?  ?Recommendations for Other Services   ? ?  ?Precautions / Restrictions Precautions ?Precautions: Fall ?Precaution Comments: check orthostatics? pt impulsive to sit ?Restrictions ?Weight Bearing Restrictions: No  ? ? ?  ? ?Mobility Bed Mobility ?Overal bed mobility: Modified Independent ?  ?  ?  ?  ?  ?  ?  ?  ? ?Transfers ?Overall transfer level: Needs assistance ?Equipment used: Rolling walker (2 wheels) ?Transfers: Sit to/from Stand, Bed to chair/wheelchair/BSC ?Sit to Stand: Min assist ?Stand pivot transfers: Min guard ?  ?  ?  ?  ?General transfer comment: initial swaying  with 1st sit/stand pt. able to correct with use of B UEs with RW.  in room ambulation to recliner min guard a.  good hand placement and control when transitioning into sitting ?  ?  ?Balance   ?  ?  ?  ?  ?  ?  ?  ?  ?  ?  ?  ?  ?  ?  ?  ?  ?  ?  ?   ? ?ADL either performed or assessed with clinical judgement  ? ?ADL Overall ADL's : Needs assistance/impaired ?  ?  ?  ?  ?  ?  ?  ?  ?  ?  ?  ?  ?Toilet Transfer: Min guard;Ambulation;Rolling walker (2 wheels) ?Toilet Transfer Details (indicate cue type and reason): observed during ambulation in room ?  ?  ?Tub/ Shower Transfer: Tub transfer;Cueing for sequencing;Cueing for safety;Ambulation;Rolling walker (2 wheels) ?Tub/Shower Transfer Details (indicate cue type and reason): attempted with simulation in room. tub with R faucet.  pt. unable bring either knee up to clear simulated ledger including not feeling comfortable weight bearing on RLE to attempt lifting LLE.  pt. reports he will sponge bathe initially ?Functional mobility during ADLs: Min guard;Rolling walker (2 wheels);Cueing for sequencing ?  ?  ? ?Extremity/Trunk Assessment   ?  ?  ?  ?  ?  ? ?Vision   ?  ?  ?Perception   ?  ?Praxis   ?  ? ?Cognition Arousal/Alertness: Awake/alert ?Behavior During Therapy: [facility] for tasks assessed/performed ?Overall Cognitive Status: Within Functional Limits for tasks assessed ?  ?  ?  ?  ?  ?  ?  ?  ?  ?  ?  ?  ?  ?  ?  ?  ?  General Comments: more alert than documented in previous session.  communicating and following instuctions accurately. ?  ?  ?   ?Exercises   ? ?  ?Shoulder Instructions   ? ? ?  ?General Comments    ? ? ?Pertinent Vitals/ Pain       Pain Assessment ?Pain Assessment: 0-10 ?Pain Score: 6  ?Pain Location: RLE (calf the worst); groin ?Pain Descriptors / Indicators: Discomfort, Aching, Operative site guarding ? ?Home Living   ?  ?  ?  ?  ?  ?  ?  ?  ?  ?  ?  ?  ?  ?  ?  ?  ?  ?  ? ?  ?Prior Functioning/Environment    ?  ?  ?  ?   ? ?Frequency ? Min 2X/week   ? ? ? ? ?  ?Progress Toward Goals ? ?OT Goals(current goals can now be found in the care plan section) ? Progress towards OT goals: Progressing toward goals ? ?   ?Plan Discharge plan remains appropriate   ? ?Co-evaluation ? ? ?   ?  ?  ?  ?  ? ?  ?AM-PAC OT "6 Clicks" Daily Activity     ?Outcome Measure ? ? Help from another person eating meals?: None ?Help from another person taking care of personal grooming?: None ?Help from another person toileting, which includes using toliet, bedpan, or urinal?: A Little ?Help from another person bathing (including washing, rinsing, drying)?: A Little ?Help from another person to put on and taking off regular upper body clothing?: None ?Help from another person to put on and taking off regular lower body clothing?: A Little ?6 Click Score: 21 ? ?  ?End of Session Equipment Utilized During Treatment: Rolling walker (2 wheels) ? ?OT Visit Diagnosis: Unsteadiness on feet (R26.81);Muscle weakness (generalized) (M62.81);Pain ?Pain - Right/Left: Right ?Pain - part of body: Leg ?  ?Activity Tolerance Patient tolerated treatment well ?  ?Patient Left in chair;with call bell/phone within reach ?  ?Nurse Communication Other (comment) (alerted RN and CNA with elevated BP readings during tx. session) ?Sit/stand: 154/74-98 ?Sit after walking in room: 141/106-118 HR 106 ?Seated 5 min. After ambulation: 136/73 ?  ? ?   ? ?Time: 1610-9604 ?OT Time Calculation (min): 17 min ? ?Charges: OT General Charges ?$OT Visit: 1 Visit ?OT Treatments ?$Self Care/Home Management : 8-22 mins ? ?Boneta Lucks, COTA/L ?Acute Rehabilitation ?[phone]  ? ?Salvadore Oxford ?01/05/2022, 11:17 AM ?

## 2022-01-05 NOTE — Progress Notes (Signed)
Physical Therapy Treatment ?Patient Details ?Name: Erik Rose ?MRN: [id number] ?DOB: 1956-09-22 ?Today's Date: 01/05/2022 ? ? ?History of Present Illness 66 yo s/p R femoral peroneal bypass graft secondary to arterosclerosis/PAD. PMH: DM, heart disease, HTN, stroke, TIA, s/p coronary angioplasty with stetn placement. ? ?  ?PT Comments  ? ? Pt received in supine, agreeable to therapy session with encouragement, with good participation and tolerance for household distance gait trial and curb step training. Pt needing up to minA physical assist for transfers and min guard for safety with gait/curb step using RW. Pt continues to benefit from PT services to progress toward functional mobility goals. Anticipate pt safe to DC home with PRN supervision/assist from family (especially for transfers) once medically cleared. Pt given gait belt and pt family present for instruction on guarding techniques for gait/stairs.   ?Recommendations for follow up therapy are one component of a multi-disciplinary discharge planning process, led by the attending physician.  Recommendations may be updated based on patient status, additional functional criteria and insurance authorization. ? ?Follow Up Recommendations ? Home health PT ?  ?  ?Assistance Recommended at Discharge Intermittent Supervision/Assistance  ?Patient can return home with the following A little help with walking and/or transfers;Assistance with cooking/housework;Assist for transportation;Help with stairs or ramp for entrance;Two people to help with walking and/or transfers;A little help with bathing/dressing/bathroom ?  ?Equipment Recommendations ? Rolling walker (2 wheels)  ?  ?Recommendations for Other Services   ? ? ?  ?Precautions / Restrictions Precautions ?Precautions: Fall ?Precaution Comments: check orthostatics? pt impulsive to sit ?Restrictions ?Weight Bearing Restrictions: No  ?  ? ?Mobility ? Bed Mobility ?Overal bed mobility: Modified Independent ?  ?   ?  ?  ?  ?  ?  ?  ? ?Transfers ?Overall transfer level: Needs assistance ?Equipment used: Rolling walker (2 wheels) ?Transfers: Sit to/from Stand, Bed to chair/wheelchair/BSC ?Sit to Stand: Min assist ?  ?  ?  ?  ?  ?General transfer comment: Improved UE placement/eccentric control to sit today and pt following instructions well for transfer technique. ?  ? ?Ambulation/Gait ?Ambulation/Gait assistance: Min guard ?Gait Distance (Feet): 40 Feet ?Assistive device: Rolling walker (2 wheels) ?Gait Pattern/deviations: Step-through pattern, Decreased stride length, Wide base of support ?  ?  ?  ?General Gait Details: heavy reliance on RW at times but no LOB or dizziness reported; guarding/antalgic pattern with short low steps ? ? ?Stairs ?Stairs: Yes ?Stairs assistance: Min guard ?Stair Management: With walker, Forwards, Step to pattern ?Number of Stairs: 2 ?General stair comments: pt ascended/descended single 7" platform x2 reps with RW support, no LOB, good carryover of instructions ? ? ?Wheelchair Mobility ?  ? ?Modified Rankin (Stroke Patients Only) ?  ? ? ?  ?Balance Overall balance assessment: Needs assistance ?Sitting-balance support: Feet supported ?Sitting balance-Leahy Scale: Fair ?  ?  ?Standing balance support: Reliant on assistive device for balance ?Standing balance-Leahy Scale: Fair ?Standing balance comment: fair balance with BUE support of RW, poor without RW ?  ?  ?  ?  ?  ?  ?  ?  ?  ?  ?  ?  ? ?  ?Cognition Arousal/Alertness: Awake/alert ?Behavior During Therapy: [facility] for tasks assessed/performed ?Overall Cognitive Status: Within Functional Limits for tasks assessed ?  ?  ?  ?  ?  ?  ?  ?  ?  ?  ?  ?  ?  ?  ?  ?  ?General Comments: Pt more alert today, with  good following of 1 and 2-step commands, pt frustrated with delay of nursing staff when call bell is pressed. ?  ?  ? ?  ?Exercises   ? ?  ?General Comments General comments (skin integrity, edema, etc.): ice given post-gait for pain/swelling mgmt ?   ?  ? ?Pertinent Vitals/Pain Pain Assessment ?Pain Assessment: Faces ?Faces Pain Scale: Hurts little more ?Pain Location: RLE/groin ?Pain Descriptors / Indicators: Discomfort, Aching, Operative site guarding ?Pain Intervention(s): Monitored during session, Repositioned, Ice applied  ? ? ?   ?   ? ?PT Goals (current goals can now be found in the care plan section) Acute Rehab PT Goals ?Patient Stated Goal: to get help for home ?PT Goal Formulation: With patient ?Time For Goal Achievement: 01/10/22 ?Progress towards PT goals: Progressing toward goals ? ?  ?Frequency ? ? ? Min 3X/week ? ? ? ?  ?PT Plan Current plan remains appropriate  ? ? ?   ?AM-PAC PT "6 Clicks" Mobility   ?Outcome Measure ? Help needed turning from your back to your side while in a flat bed without using bedrails?: None ?Help needed moving from lying on your back to sitting on the side of a flat bed without using bedrails?: A Little ?Help needed moving to and from a bed to a chair (including a wheelchair)?: A Little ?Help needed standing up from a chair using your arms (e.g., wheelchair or bedside chair)?: A Little ?Help needed to walk in hospital room?: A Little ?Help needed climbing 3-5 steps with a railing? : A Little ?6 Click Score: 19 ? ?  ?End of Session Equipment Utilized During Treatment: Gait belt ?Activity Tolerance: Patient tolerated treatment well ?Patient left: with call bell/phone within reach;in chair;with chair alarm set ?Nurse Communication: Mobility status;Other (comment) (family requesting RN come to review DC paperwork, they are in loading zone) ?PT Visit Diagnosis: Unsteadiness on feet (R26.81);Muscle weakness (generalized) (M62.81);Pain ?Pain - Right/Left: Right ?Pain - part of body: Leg;Ankle and joints of foot ?  ? ? ?Time: 6063-0160 ?PT Time Calculation (min) (ACUTE ONLY): 29 min ? ?Charges:  $Gait Training: 8-22 mins ?$Therapeutic Activity: 8-22 mins          ?          ? ?Kenny Rea P., PTA ?Acute Rehabilitation  Services ?Secure Chat Preferred 9a-5:30pm ?Office: [phone]  ? ? ?Dorathy Kinsman Japhet Morgenthaler ?01/05/2022, 1:24 PM ? ?

## 2022-01-07 ENCOUNTER — Telehealth: Payer: Self-pay | Admitting: General Practice

## 2022-01-07 NOTE — Discharge Summary (Signed)
?Bypass Discharge Summary ?Patient ID: ?Erik Rose ?[id number] ?66 y.o. ?1956/09/12 ? ?Admit date: 01/02/2022 ? ?Discharge date and time: 01/05/2022  1:48 PM  ? ?Admitting Physician: Cherre Robins, MD  ? ?Discharge Physician: Dr. Stanford Breed ? ?Admission Diagnoses: PAD (peripheral artery disease) ([place]) [I73.9] ? ?Discharge Diagnoses: PAD ? ?Admission Condition: fair ? ?Discharged Condition: fair ? ?Indication for Admission: Critical limb ischemia ? ?Hospital Course: Mr. Sante Biedermann is a 67 year old male who was brought in as an outpatient due to critical limb ischemia of the right lower extremity.  He underwent right common femoral to peroneal bypass with in situ greater saphenous vein by Dr. Stanford Breed on 01/02/2022.  He tolerated the procedure well and was admitted to the hospital postoperatively.  He had a uncomplicated inpatient stay.  Much of his hospital stay consisted of increasing mobility and postoperative pain control.  At the time of discharge he had a brisk right DP and peroneal signal by Doppler.  He did experience pitting edema of the right lower leg however was educated on compression and elevation when discharged home.  Therapy recommended home health physical therapy which was arranged before discharge.  He will follow-up in office in about 2 to 3 weeks.  He was prescribed narcotic pain medication for continued postoperative pain control.  He was discharged home in stable condition. ? ?Consults: None ? ?Treatments: surgery: Right common femoral to peroneal bypass with in situ greater saphenous vein by Dr. Stanford Breed on 01/02/2022 ? ? ? ?Disposition: Discharge disposition: 01-Home or Self Care ? ? ? ? ? ? ?- For [facility] Registry use --- ? ?Post-op:  ?Wound infection: No  ?Graft infection: No  ?Transfusion: No  ?New Arrhythmia: No ?Patency judged by: '[ ]'  Dopper only, '[ ]'  Palpable graft pulse, '[ ]'  Palpable distal pulse, '[ ]'  ABI inc. > 0.15, '[ ]'  Duplex ?D/C Ambulatory Status:  Ambulatory ? ?Complications: ?MI: [x ] No, '[ ]'  Troponin only, '[ ]'  EKG or Clinical ?CHF: No ?Resp failure: [x ] none, '[ ]'  Pneumonia, '[ ]'  Ventilator ?Chg in renal function: [ x] none, '[ ]'  Inc. Cr > 0.5, '[ ]'  Temp. Dialysis, '[ ]'  Permanent dialysis ?Stroke: [ x] None, '[ ]'  Minor, '[ ]'  Major ?Return to OR: No  ?Reason for return to OR: '[ ]'  Bleeding, '[ ]'  Infection, '[ ]'  Thrombosis, '[ ]'  Revision ? ?Discharge medications: ?Statin use:  Yes ?ASA use:  Yes ?Plavix use:  No  for medical reason not indicated ?Beta blocker use: Yes ?Coumadin use: No  for medical reason not indicated ? ? ? ?Patient Instructions:  ?Allergies as of 01/05/2022   ?No Known Allergies ?  ? ?  ?Medication List  ?  ? ?TAKE these medications   ? ?amLODipine 5 MG tablet ?Commonly known as: NORVASC ?Take 1 tablet (5 mg total) by mouth daily. ?  ?aspirin EC 81 MG tablet ?Take 1 tablet (81 mg total) by mouth daily. Swallow whole. ?  ?atorvastatin 80 MG tablet ?Commonly known as: LIPITOR ?Take 1 tablet (80 mg total) by mouth daily. ?  ?carvedilol 12.5 MG tablet ?Commonly known as: COREG ?TAKE 1 TABLET(12.5 MG) BY MOUTH TWICE DAILY WITH A MEAL ?  ?esomeprazole 20 MG capsule ?Commonly known as: [place] ?Take 20 mg by mouth every morning. OTC ?  ?gabapentin 300 MG capsule ?Commonly known as: NEURONTIN ?One tab PO qHS for a week, then BID for a week, then TID. May double weekly to a max of 3,674m/day ?What changed:  ?how much  to take ?how to take this ?when to take this ?reasons to take this ?additional instructions ?  ?insulin lispro 100 UNIT/ML KwikPen ?Commonly known as: HumaLOG KwikPen ?INJECT 15 UNITS INTO THE SKIN 3 TIMES DAILY ?  ?Lantus SoloStar 100 UNIT/ML Solostar Pen ?Generic drug: insulin glargine ?ADMINISTER 55 UNITS UNDER THE SKIN DAILY ?What changed:  ?how much to take ?when to take this ?additional instructions ?  ?lisinopril 40 MG tablet ?Commonly known as: ZESTRIL ?Take 1 tablet (40 mg total) by mouth daily. ?  ?loratadine 10 MG tablet ?Commonly known  as: CLARITIN ?Take 10 mg by mouth daily. ?  ?Magnesium 250 MG Tabs ?Take 250 mg by mouth daily. ?  ?meloxicam 15 MG tablet ?Commonly known as: MOBIC ?Take 1 tablet (15 mg total) by mouth daily as needed for pain. Take daily for 10 days and then as needed. ?  ?metFORMIN 500 MG 24 hr tablet ?Commonly known as: Glucophage XR ?Take 2 tablets (1,000 mg total) by mouth daily with breakfast. ?  ?multivitamin with minerals tablet ?Take 1 tablet by mouth daily. Centrum 50+ ?  ?ONE TOUCH ULTRA 2 w/Device Kit ?Use to take blood sugar one to two times daily ?  ?OneTouch Ultra test strip ?Generic drug: glucose blood ?USE AS DIRECTED TO TEST BLOOD SUGAR TWICE DAILY ?  ?oxyCODONE-acetaminophen 5-325 MG tablet ?Commonly known as: PERCOCET/ROXICET ?Take 1 tablet by mouth every 6 (six) hours as needed for moderate pain. ?  ?triamcinolone cream 0.1 % ?Commonly known as: KENALOG ?Apply 1 application topically 2 (two) times daily. ?What changed:  ?when to take this ?reasons to take this ?  ? ?  ? ?Activity: activity as tolerated ?Diet: regular diet ?Wound Care: keep wound clean and dry ? ?Follow-up with [facility] in 3 weeks. ? ?Signed: ?Dagoberto Ligas ?01/07/2022 ?10:57 AM ? ?

## 2022-01-07 NOTE — Telephone Encounter (Signed)
Transition Care Management Follow-up Telephone Call ?Date of discharge and from where: 01/05/22 from [facility] ?How have you been since you were released from the hospital? Femoral Artery bypass- successful. Doing ok.  ?Any questions or concerns? No ? ?Items Reviewed: ?Did the pt receive and understand the discharge instructions provided? Yes  ?Medications obtained and verified? No  ?Other? No  ?Any new allergies since your discharge? No  ?Dietary orders reviewed? Yes ?Do you have support at home? Yes  ? ?Home Care and Equipment/Supplies: ?Were home health services ordered? yes ?If so, what is the name of the agency? [facility]  ?Has the agency set up a time to come to the patient's home? yes ?Were any new equipment or medical supplies ordered?  No ?Were you able to get the supplies/equipment? no ?Do you have any questions related to the use of the equipment or supplies? No ? ?Functional Questionnaire: (I = Independent and D = Dependent) ?ADLs: D ? ?Bathing/Dressing- D ? ?Meal Prep- D ? ?Eating- I ? ?Maintaining continence- I ? ?Transferring/Ambulation- D with walker and gait belt ? ?Managing Meds- I ? ?Follow up appointments reviewed: ? ?PCP Hospital f/u appt confirmed? Yes  Scheduled to see Dr. Ashley Royalty on 01/30/22 @ 1030. ?Specialist Hospital f/u appt confirmed? Yes  Scheduled to see Dr. Juanetta Gosling on 01/29/22. ?Are transportation arrangements needed? No  ?If their condition worsens, is the pt aware to call PCP or go to the Emergency Dept.? Yes ?Was the patient provided with contact information for the PCP's office or ED? Yes ?Was to pt encouraged to call back with questions or concerns? Yes  ?

## 2022-01-08 ENCOUNTER — Ambulatory Visit: Payer: Medicare PPO | Admitting: Physical Therapy

## 2022-01-09 ENCOUNTER — Telehealth: Payer: Self-pay | Admitting: *Deleted

## 2022-01-09 NOTE — Telephone Encounter (Signed)
Threasa Alpha nurse w/[facility] called for clarification of Gabapentin for patient and who this was written by ? ?Had to LVM informing him of the following:  ? ?Sig: One tab PO qHS for a week, then BID for a week, then TID. May double weekly to a max of 3,600mg /day  ?Patient taking differently: Take 300-600 mg by mouth at bedtime as needed (pain).  ?     ?Start Date: 05/30/21 End Date: --  ?Written Date: 05/30/21 Expiration Date: 05/30/22  ?Authorized by: Rodney Langton J ? ?Advised to call back if any additional information is needed.  ?

## 2022-01-10 ENCOUNTER — Encounter: Payer: Self-pay | Admitting: Physician Assistant

## 2022-01-10 ENCOUNTER — Ambulatory Visit: Payer: Medicare PPO | Admitting: Physical Therapy

## 2022-01-10 ENCOUNTER — Ambulatory Visit (INDEPENDENT_AMBULATORY_CARE_PROVIDER_SITE_OTHER): Payer: Medicare PPO | Admitting: Physician Assistant

## 2022-01-10 DIAGNOSIS — I70221 Atherosclerosis of native arteries of extremities with rest pain, right leg: Secondary | ICD-10-CM

## 2022-01-10 NOTE — Progress Notes (Signed)
?POST OPERATIVE OFFICE NOTE ? ? ? ?CC:  F/u for surgery ? ?HPI:  This is a 66 y.o. male who is s/p right common femoral to peroneal bypass with in situ greater saphenous vein by Dr. Stanford Breed on 01/02/2022 due to ischemic rest pain.  Patient states incisions of right groin and thigh have completely healed.  He is having some bloody drainage from his below the knee incision.  He denies any rest pain in his foot.  He is on aspirin and statin daily. ? ?No Known Allergies ? ?Current Outpatient Medications  ?Medication Sig Dispense Refill  ? amLODipine (NORVASC) 5 MG tablet Take 1 tablet (5 mg total) by mouth daily. 90 tablet 3  ? aspirin EC 81 MG tablet Take 1 tablet (81 mg total) by mouth daily. Swallow whole.    ? atorvastatin (LIPITOR) 80 MG tablet Take 1 tablet (80 mg total) by mouth daily. 90 tablet 3  ? Blood Glucose Monitoring Suppl (ONE TOUCH ULTRA 2) w/Device KIT Use to take blood sugar one to two times daily 1 each 0  ? carvedilol (COREG) 12.5 MG tablet TAKE 1 TABLET(12.5 MG) BY MOUTH TWICE DAILY WITH A MEAL 180 tablet 1  ? esomeprazole (NEXIUM) 20 MG capsule Take 20 mg by mouth every morning. OTC    ? gabapentin (NEURONTIN) 300 MG capsule One tab PO qHS for a week, then BID for a week, then TID. May double weekly to a max of 3,678m/day (Patient taking differently: Take 300-600 mg by mouth at bedtime as needed (pain).) 90 capsule 3  ? insulin glargine (LANTUS SOLOSTAR) 100 UNIT/ML Solostar Pen ADMINISTER 55 UNITS UNDER THE SKIN DAILY (Patient taking differently: 55 Units daily. ADMINISTER 55 UNITS UNDER THE SKIN DAILY ? ?Takes in the AM) 51 mL 2  ? insulin lispro (HUMALOG KWIKPEN) 100 UNIT/ML KwikPen INJECT 15 UNITS INTO THE SKIN 3 TIMES DAILY 15 mL 3  ? lisinopril (ZESTRIL) 40 MG tablet Take 1 tablet (40 mg total) by mouth daily. 90 tablet 1  ? loratadine (CLARITIN) 10 MG tablet Take 10 mg by mouth daily.    ? Magnesium 250 MG TABS Take 250 mg by mouth daily.    ? meloxicam (MOBIC) 15 MG tablet Take 1 tablet  (15 mg total) by mouth daily as needed for pain. Take daily for 10 days and then as needed. 90 tablet 0  ? metFORMIN (GLUCOPHAGE XR) 500 MG 24 hr tablet Take 2 tablets (1,000 mg total) by mouth daily with breakfast. 180 tablet 1  ? Multiple Vitamins-Minerals (MULTIVITAMIN WITH MINERALS) tablet Take 1 tablet by mouth daily. Centrum 50+    ? ONETOUCH ULTRA test strip USE AS DIRECTED TO TEST BLOOD SUGAR TWICE DAILY 100 strip 1  ? oxyCODONE-acetaminophen (PERCOCET/ROXICET) 5-325 MG tablet Take 1 tablet by mouth every 6 (six) hours as needed for moderate pain. 20 tablet 0  ? triamcinolone cream (KENALOG) 0.1 % Apply 1 application topically 2 (two) times daily. (Patient taking differently: Apply 1 application. topically 2 (two) times daily as needed (irritation).) 30 g 0  ? ?No current facility-administered medications for this visit.  ? ? ? ROS:  See HPI ? ?Physical Exam: ? ?There were no vitals filed for this visit. ? ?Incision: Right groin and thigh incisions healed; serosanguineous drainage from below the knee incision; pitting edema to the level of the knee ?Extremities:   Right DP and peroneal signal brisk by Doppler; brisk signal overlying bypass at the level of the knee ?Neuro: A&O ? ?Assessment/Plan:  This is a 66 y.o. male who is s/p: ?Right common femoral to peroneal bypass with in situ greater saphenous vein ? ?-Patent bypass with dopplerable flow through the bypass graft as well as brisk Doppler signal at the [facility] and peroneal artery ?-Right groin and thigh incisions are well-healed.  Patient is experiencing serosanguineous drainage from the below the knee incision.  This is likely related to right lower leg edema.  I encouraged the patient to elevate his leg above the level of his heart when resting.  He should also use light to moderate compression with an Ace wrap to the level of the knee. ?-Patient will keep his scheduled imaging studies and appointment with Dr. Stanford Breed later this month.  He knows to  call/return office sooner with any questions or concerns ? ? ?Dagoberto Ligas, PA-C ?[facility] ?[phone] ? ?Clinic MD:  Scot Dock ? ?

## 2022-01-15 ENCOUNTER — Other Ambulatory Visit: Payer: Self-pay | Admitting: Family Medicine

## 2022-01-15 ENCOUNTER — Emergency Department (HOSPITAL_COMMUNITY)
Admission: EM | Admit: 2022-01-15 | Discharge: 2022-01-15 | Disposition: A | Discharge status: Home / Self Care | Payer: Medicare PPO | Attending: Emergency Medicine | Admitting: Emergency Medicine

## 2022-01-15 ENCOUNTER — Telehealth: Payer: Self-pay | Admitting: *Deleted

## 2022-01-15 DIAGNOSIS — Z7982 Long term (current) use of aspirin: Secondary | ICD-10-CM | POA: Insufficient documentation

## 2022-01-15 DIAGNOSIS — Z4801 Encounter for change or removal of surgical wound dressing: Secondary | ICD-10-CM | POA: Diagnosis present

## 2022-01-15 DIAGNOSIS — D72829 Elevated white blood cell count, unspecified: Secondary | ICD-10-CM | POA: Diagnosis not present

## 2022-01-15 DIAGNOSIS — Z794 Long term (current) use of insulin: Secondary | ICD-10-CM | POA: Diagnosis not present

## 2022-01-15 DIAGNOSIS — R600 Localized edema: Secondary | ICD-10-CM | POA: Diagnosis not present

## 2022-01-15 DIAGNOSIS — Z5189 Encounter for other specified aftercare: Secondary | ICD-10-CM

## 2022-01-15 LAB — CBC WITH DIFFERENTIAL/PLATELET
Abs Immature Granulocytes: 0.1 10*3/uL — ABNORMAL HIGH (ref 0.00–0.07)
Basophils Absolute: 0.1 10*3/uL (ref 0.0–0.1)
Basophils Relative: 1 %
Eosinophils Absolute: 0.4 10*3/uL (ref 0.0–0.5)
Eosinophils Relative: 2 %
HCT: 27.7 % — ABNORMAL LOW (ref 39.0–52.0)
Hemoglobin: 8.9 g/dL — ABNORMAL LOW (ref 13.0–17.0)
Immature Granulocytes: 1 %
Lymphocytes Relative: 21 %
Lymphs Abs: 3 10*3/uL (ref 0.7–4.0)
MCH: 28.2 pg (ref 26.0–34.0)
MCHC: 32.1 g/dL (ref 30.0–36.0)
MCV: 87.7 fL (ref 80.0–100.0)
Monocytes Absolute: 1 10*3/uL (ref 0.1–1.0)
Monocytes Relative: 7 %
Neutro Abs: 10.3 10*3/uL — ABNORMAL HIGH (ref 1.7–7.7)
Neutrophils Relative %: 68 %
Platelets: 814 10*3/uL — ABNORMAL HIGH (ref 150–400)
RBC: 3.16 MIL/uL — ABNORMAL LOW (ref 4.22–5.81)
RDW: 15.6 % — ABNORMAL HIGH (ref 11.5–15.5)
WBC: 14.8 10*3/uL — ABNORMAL HIGH (ref 4.0–10.5)
nRBC: 0.1 % (ref 0.0–0.2)

## 2022-01-15 LAB — COMPREHENSIVE METABOLIC PANEL
ALT: 30 U/L (ref 0–44)
AST: 25 U/L (ref 15–41)
Albumin: 3.3 g/dL — ABNORMAL LOW (ref 3.5–5.0)
Alkaline Phosphatase: 111 U/L (ref 38–126)
Anion gap: 10 (ref 5–15)
BUN: 13 mg/dL (ref 8–23)
CO2: 22 mmol/L (ref 22–32)
Calcium: 9.2 mg/dL (ref 8.9–10.3)
Chloride: 106 mmol/L (ref 98–111)
Creatinine, Ser: 0.96 mg/dL (ref 0.61–1.24)
GFR, Estimated: >60 mL/min (ref >60)
Glucose, Bld: 114 mg/dL — ABNORMAL HIGH (ref 70–99)
Potassium: 4.6 mmol/L (ref 3.5–5.1)
Sodium: 138 mmol/L (ref 135–145)
Total Bilirubin: 0.5 mg/dL (ref 0.3–1.2)
Total Protein: 7.1 g/dL (ref 6.5–8.1)

## 2022-01-15 NOTE — Discharge Instructions (Signed)
?  If you have any other concerns about your wound, please call or follow-up with a vascular surgeon.  On my exam it appears to be healing well.  Continue to do the same type of rehab that you have been doing so far, with keeping your leg elevated at home whenever possible, but also making sure to get up on your leg several times a day. ?

## 2022-01-15 NOTE — ED Provider Notes (Signed)
?[facility] ?Provider Note ? ? ?CSN: [id number] ?Arrival date & time: 01/15/22  1432 ? ?  ? ?History ? ?Chief Complaint  ?Patient presents with  ? Wound Check  ? Wound Dehiscence  ? ? ?Erik Rose is a 66 y.o. male present emergency department for wound check.  The patient underwent a graft of his right lower extremity approximately 2 weeks ago with the vascular surgeons.  He has been keeping it dressed and elevated at home, trying to walk on it gingerly.  He reports he has been doing well overall but noted some bloody drainage, mild, when he attempts to walk on his leg the past week.  His family wanted him to come have it reevaluated.  He denies fevers or chills. ? ?HPI ? ?  ? ?Home Medications ?Prior to Admission medications   ?Medication Sig Start Date End Date Taking? Authorizing Provider  ?amLODipine (NORVASC) 5 MG tablet Take 1 tablet (5 mg total) by mouth daily. 02/05/21   [name], DO  ?aspirin EC 81 MG tablet Take 1 tablet (81 mg total) by mouth daily. Swallow whole. 08/10/20   Freada Bergeron, MD  ?atorvastatin (LIPITOR) 80 MG tablet Take 1 tablet (80 mg total) by mouth daily. 08/15/21   [name], DO  ?Blood Glucose Monitoring Suppl (ONE TOUCH ULTRA 2) w/Device KIT Use to take blood sugar one to two times daily 09/18/18   [name], DO  ?carvedilol (COREG) 12.5 MG tablet TAKE 1 TABLET(12.5 MG) BY MOUTH TWICE DAILY WITH A MEAL 11/01/21   [name], DO  ?esomeprazole (NEXIUM) 20 MG capsule Take 20 mg by mouth every morning. OTC    [provider]  ?gabapentin (NEURONTIN) 300 MG capsule One tab PO qHS for a week, then BID for a week, then TID. May double weekly to a max of 3,645m/day ?Patient taking differently: Take 300-600 mg by mouth at bedtime as needed (pain). 05/30/21   TSilverio Decamp MD  ?insulin glargine (LANTUS SOLOSTAR) 100 UNIT/ML Solostar Pen ADMINISTER 55 UNITS UNDER THE SKIN DAILY ?Patient taking differently:  55 Units daily. ADMINISTER 55 UNITS UNDER THE SKIN DAILY ? ?Takes in the AM 11/22/21   MLuetta Nutting DO  ?insulin lispro (HUMALOG KWIKPEN) 100 UNIT/ML KwikPen INJECT 15 UNITS INTO THE SKIN 3 TIMES DAILY 09/03/21   MLuetta Nutting DO  ?lisinopril (ZESTRIL) 40 MG tablet Take 1 tablet (40 mg total) by mouth daily. 09/14/21   PFreada Bergeron MD  ?loratadine (CLARITIN) 10 MG tablet Take 10 mg by mouth daily.    [provider]  ?Magnesium 250 MG TABS Take 250 mg by mouth daily.    [provider]  ?meloxicam (MOBIC) 15 MG tablet Take 1 tablet (15 mg total) by mouth daily as needed for pain. Take daily for 10 days and then as needed. 12/20/21   MLuetta Nutting DO  ?metFORMIN (GLUCOPHAGE XR) 500 MG 24 hr tablet Take 2 tablets (1,000 mg total) by mouth daily with breakfast. 10/23/21   MLuetta Nutting DO  ?Multiple Vitamins-Minerals (MULTIVITAMIN WITH MINERALS) tablet Take 1 tablet by mouth daily. Centrum 50+    [provider]  ?ODonald Sivatest strip USE AS DIRECTED TO TEST BLOOD SUGAR TWICE DAILY 12/06/21   MLuetta Nutting DO  ?oxyCODONE-acetaminophen (PERCOCET/ROXICET) 5-325 MG tablet Take 1 tablet by mouth every 6 (six) hours as needed for moderate pain. 01/05/22   EDagoberto Ligas PA-C  ?triamcinolone cream (KENALOG) 0.1 % Apply 1 application topically 2 (  two) times daily. ?Patient taking differently: Apply 1 application. topically 2 (two) times daily as needed (irritation). 08/15/21   [name], DO  ?   ? ?Allergies    ?Patient has no known allergies.   ? ?Review of Systems   ?Review of Systems ? ?Physical Exam ?Updated Vital Signs ?BP 139/80   Pulse 86   Temp 98.5 ?F (36.9 ?C) (Oral)   Resp 16   SpO2 97%  ?Physical Exam ?Constitutional:   ?   General: He is not in acute distress. ?HENT:  ?   Head: Normocephalic and atraumatic.  ?Eyes:  ?   Conjunctiva/sclera: Conjunctivae normal.  ?   Pupils: Pupils are equal, round, and reactive to light.  ?Cardiovascular:  ?   Rate and Rhythm:  Normal rate and regular rhythm.  ?Pulmonary:  ?   Effort: Pulmonary effort is normal. No respiratory distress.  ?Musculoskeletal:  ?   Comments: Edema of the right lower extremity, surgical wound appears clean and intact, edges of the wound are nonerythematous, no active drainage of the wound on my exam, calf compartment is soft  ?Skin: ?   General: Skin is warm and dry.  ?Neurological:  ?   General: No focal deficit present.  ?   Mental Status: He is alert. Mental status is at baseline.  ?Psychiatric:     ?   Mood and Affect: Mood normal.     ?   Behavior: Behavior normal.  ? ? ?ED Results / Procedures / Treatments   ?Labs ?(all labs ordered are listed, but only abnormal results are displayed) ?Labs Reviewed  ?CBC WITH DIFFERENTIAL/PLATELET - Abnormal; Notable for the following components:  ?    Result Value  ? WBC 14.8 (*)   ? RBC 3.16 (*)   ? Hemoglobin 8.9 (*)   ? HCT 27.7 (*)   ? RDW 15.6 (*)   ? Platelets 814 (*)   ? Neutro Abs 10.3 (*)   ? Abs Immature Granulocytes 0.10 (*)   ? All other components within normal limits  ?COMPREHENSIVE METABOLIC PANEL - Abnormal; Notable for the following components:  ? Glucose, Bld 114 (*)   ? Albumin 3.3 (*)   ? All other components within normal limits  ? ? ?EKG ?None ? ?Radiology ?No results found. ? ?Procedures ?Procedures  ? ? ?Medications Ordered in ED ?Medications - No data to display ? ?ED Course/ Medical Decision Making/ A&P ?  ?                        ?Medical Decision Making ? ?This patient's leg wound per my evaluation appears to be doing well, I do not see signs of dehiscence or infection.  Is not feel any underlying hematoma, and his compartment overall is soft.  He has some expected edema of the lower extremity after an operation of this nature.  He is doing all the right things in terms of rehab, including keeping his leg elevated, but also making sure to get up and walk on the leg in the daytime.  I personally reviewed and interpreted his blood work, CMP is  unremarkable.  Hemoglobin is slightly improved from his discharge, now 8.9.  He has some chronic leukocytosis but overall the white blood cell count trend has trended down since he left the hospital.  I have a low suspicion for sepsis, infection.  I do not believe we need emergent imaging of the lower extremity.  I doubt ischemic  foot.  I believe he is reasonably safe and stable for discharge.  I was also able to speak to the patient's daughter by phone. ? ? ? ? ? ? ? ?Final Clinical Impression(s) / ED Diagnoses ?Final diagnoses:  ?Encounter for wound re-check  ? ? ?Rx / DC Orders ?ED Discharge Orders   ? ? None  ? ?  ? ? ?  ?Wyvonnia Dusky, MD ?01/15/22 1926 ? ?

## 2022-01-15 NOTE — Telephone Encounter (Signed)
Daughter called stating that home health nurse had reported that patient's below the knee incision had dehisced and draining serosanguineous fluid.  Daughter reported that patient is in the ED to be evaluated. ?

## 2022-01-15 NOTE — ED Provider Triage Note (Signed)
Emergency Medicine Provider Triage Evaluation Note ? ?Erik Rose , a 66 y.o. male  was evaluated in triage.  Pt complains of dehiscence of the wound on his right medial lower leg.,   Over the past 12 hours it has been bleeding more and had dehiscence of the wound.  No fevers.  He reports that his pain is slightly increased ? ? ?Physical Exam  ?BP 122/72 (BP Location: Right Arm)   Pulse 82   Temp 98.5 ?F (36.9 ?C) (Oral)   Resp 17   SpO2 99%  ?Gen:   Awake, no distress   ?Resp:  Normal effort  ?MSK:   Moves extremities without difficulty  ?Other:  Dressing on right lower leg with dehiscence through dermis over mid to lower part.  Scant bloody drainage.  ? ?Medical Decision Making  ?Medically screening exam initiated at 3:51 PM.  Appropriate orders placed.  Erik Rose was informed that the remainder of the evaluation will be completed by another provider, this initial triage assessment does not replace that evaluation, and the importance of remaining in the ED until their evaluation is complete. ? ? ?  ?Cristina Gong, PA-C ?01/15/22 1556 ? ?

## 2022-01-15 NOTE — ED Triage Notes (Signed)
Pt w c/o wound dehiscence to R medial calf. Pt denies pain, fever, states there are no associated symptoms. Hx bypass sx 3/29, DC Saturday w no issue ?

## 2022-01-22 ENCOUNTER — Other Ambulatory Visit: Payer: Self-pay | Admitting: Physician Assistant

## 2022-01-22 ENCOUNTER — Telehealth: Payer: Self-pay

## 2022-01-22 DIAGNOSIS — I70221 Atherosclerosis of native arteries of extremities with rest pain, right leg: Secondary | ICD-10-CM

## 2022-01-22 DIAGNOSIS — Z9889 Other specified postprocedural states: Secondary | ICD-10-CM

## 2022-01-22 DIAGNOSIS — R2681 Unsteadiness on feet: Secondary | ICD-10-CM

## 2022-01-22 MED ORDER — AMBULATORY NON FORMULARY MEDICATION: 0 refills | Status: DC

## 2022-01-22 NOTE — Telephone Encounter (Signed)
[employer] called and states Erik Rose is in need of a bedside commode. They would like it faxed to [facility]. ? ?[facility]  ?Fax [phone] ? ?Faxed order to [facility].  ?

## 2022-01-23 MED ORDER — AMBULATORY NON FORMULARY MEDICATION: 0 refills | Status: DC

## 2022-01-23 NOTE — Telephone Encounter (Signed)
Rx completed and placed in Panya's box.

## 2022-01-24 ENCOUNTER — Other Ambulatory Visit: Payer: Self-pay

## 2022-01-24 DIAGNOSIS — I70221 Atherosclerosis of native arteries of extremities with rest pain, right leg: Secondary | ICD-10-CM

## 2022-01-24 NOTE — Telephone Encounter (Signed)
Rx has been faxed to [facility].  ?Confirmation received. ?

## 2022-01-28 ENCOUNTER — Other Ambulatory Visit: Payer: Self-pay

## 2022-01-28 DIAGNOSIS — I10 Essential (primary) hypertension: Secondary | ICD-10-CM

## 2022-01-28 MED ORDER — AMLODIPINE BESYLATE 5 MG PO TABS: 5.0000 mg | ORAL_TABLET | Freq: Every day | ORAL | 3 refills | Status: DC

## 2022-01-28 NOTE — Progress Notes (Signed)
[facility] [place] ? ?ASSESSMENT / PLAN: ?Erik Rose is a 66 y.o. male with atherosclerosis of native arteries of right lower extremity status post right common femoral to peroneal bypass with in-situ greater saphenous vein 01/02/22.  ? ?Recommend the following which can slow the progression of atherosclerosis and reduce the risk of major adverse cardiac / limb events:  ?Complete cessation from all tobacco products. ?Blood glucose control with goal A1c < 7%. ?Blood pressure control with goal blood pressure < 140/90 mmHg. ?Lipid reduction therapy with goal LDL-C <100 mg/dL (<70 if symptomatic from PAD).  ?Aspirin 56m PO QD.  ?Atorvastatin 40-80mg PO QD (or other "high intensity" statin therapy). ? ?I wrapped the patient in a compression dressing today.  I will change this next week and monitor the calf until it is healing appropriately. ? ?CHIEF COMPLAINT: right foot pain ? ?HISTORY OF PRESENT ILLNESS: ?Erik VIDRIOis a 66y.o. male referred to clinic for evaluation of peripheral arterial disease.  The patient has a fairly classic history of ischemic rest pain of the right foot.  He says this has been going on for several months.  He reports pain at the ball of his foot which occasionally awakens him from sleep.  He does not have any ulcers on his feet.  He reports some antecedent claudication.  He is a smoker.  He is a diabetic who is trying to improve his control. ? ?01/29/22: Returns to clinic reporting significant swelling in his leg.  This is quite bothersome to him.  He has had some superficial dehiscence of the left calf incision.  Ischemic rest pain has resolved. ? ?VASCULAR SURGICAL HISTORY: none ? ?VASCULAR RISK FACTORS: ?Negative history of stroke / transient ischemic attack. ?Positive history of coronary artery disease. + history of PCI.  ?Positive history of diabetes mellitus. Last A1c 7.5. ?Positive history of smoking. + actively smoking. ?Positive history of  hypertension.  ?Negative history of chronic kidney disease.  Last GFR >60.  ?Negative history of chronic obstructive pulmonary disease. ? ?FUNCTIONAL STATUS: ?ECOG performance status: (0) Fully active, able to carry on all predisease performance without restriction ?Ambulatory status: Ambulatory within the community with limits ? ?Past Medical History:  ?Diagnosis Date  ? Anemia   ? [place]   ? Diverticulitis   ? GERD (gastroesophageal reflux disease)   ? Heart disease   ? History of kidney stones   ? passed  ? Hypertension   ? Myocardial infarction ([facility]   ? "small one, blood numbers did not rise.  ? Peripheral vascular disease ([facility]   ? Stroke ([facility] 2018  ? no residual effects  ? TIA (transient ischemic attack) 2017  ? ? ?Past Surgical History:  ?Procedure Laterality Date  ? ABDOMINAL AORTOGRAM W/LOWER EXTREMITY N/A 12/28/2021  ? Procedure: ABDOMINAL AORTOGRAM W/LOWER EXTREMITY;  Surgeon: HCherre Robins MD;  [facility];  Service: Cardiovascular;  Laterality: N/A;  ? BYPASS GRAFT FEMORAL-PERONEAL Right 01/02/2022  ? Procedure: RIGHT FEMORAL-PERONEAL BYPASS GRAFT;  Surgeon: HCherre Robins MD;  [facility];  Service: Vascular;  Laterality: Right;  ? CARDIAC CATHETERIZATION    ? CORONARY ANGIOPLASTY WITH STENT PLACEMENT  2010  ? LEG ANGIOGRAPHY Right 01/02/2022  ? Procedure: LEG ANGIOGRAPHY;  Surgeon: HCherre Robins MD;  [place] [facility]  Service: Vascular;  Laterality: Right;  ? ? ?Family History  ?Problem Relation Age of Onset  ? Hypertension Mother   ? Kidney disease  Mother   ? Miscarriages / [place] Mother   ? Stroke Mother   ? Heart disease Father   ? Depression Sister   ? Kidney disease Sister   ? Diabetes Brother   ? Hypertension Brother   ? Hyperlipidemia Brother   ? Kidney disease Brother   ? ? ?Social History  ? ?Socioeconomic History  ? Marital status: Married  ?  Spouse name: [name]  ? Number of children: 4  ? Years of education: 49  ?  Highest education level: Associate degree: academic program  ?Occupational History  ?  Comment: Disability/retired.  ?Tobacco Use  ? Smoking status: Every Day  ?  Packs/day: 0.50  ?  Years: 38.00  ?  Pack years: 19.00  ?  Types: Cigarettes  ? Smokeless tobacco: Never  ? Tobacco comments:  ?  5-8 cigarettes a day  ?Vaping Use  ? Vaping Use: Never used  ?Substance and Sexual Activity  ? Alcohol use: Not Currently  ?  Comment: RARE  ? Drug use: Not Currently  ?  Types: Marijuana, Cocaine  ?  Comment: stopped 1991  ? Sexual activity: Not on file  ?Other Topics Concern  ? Not on file  ?Social History Narrative  ? 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   ? ?Social Determinants of Health  ? ?Financial Resource Strain: Not on file  ?Food Insecurity: Not on file  ?Transportation Needs: Not on file  ?Physical Activity: Not on file  ?Stress: Not on file  ?Social Connections: Not on file  ?Intimate Partner Violence: Not on file  ? ? ?No Known Allergies ? ?Current Outpatient Medications  ?Medication Sig Dispense Refill  ? AMBULATORY NON FORMULARY MEDICATION Bedside commode 1 each 0  ? AMBULATORY NON FORMULARY MEDICATION Please provide 3 in 1 bedside commode for patient.  Diagnosis:  Post operative state Z98.890, Gait instability R26.81 1 Device 0  ? amLODipine (NORVASC) 5 MG tablet Take 1 tablet (5 mg total) by mouth daily. 90 tablet 3  ? aspirin EC 81 MG tablet Take 1 tablet (81 mg total) by mouth daily. Swallow whole.    ? atorvastatin (LIPITOR) 80 MG tablet Take 1 tablet (80 mg total) by mouth daily. 90 tablet 3  ? Blood Glucose Monitoring Suppl (ONE TOUCH ULTRA 2) w/Device KIT Use to take blood sugar one to two times daily 1 each 0  ? carvedilol (COREG) 12.5 MG tablet TAKE 1 TABLET(12.5 MG) BY MOUTH TWICE DAILY WITH A MEAL 180 tablet 1  ? esomeprazole (NEXIUM) 20 MG capsule Take 20 mg by mouth every morning. OTC    ? gabapentin (NEURONTIN) 300 MG capsule One tab PO qHS for a week, then  BID for a week, then TID. May double weekly to a max of 3,641m/day (Patient taking differently: Take 300-600 mg by mouth at bedtime as needed (pain).) 90 capsule 3  ? insulin glargine (LANTUS SOLOSTAR) 100 UNIT/ML Solostar Pen ADMINISTER 55 UNITS UNDER THE SKIN DAILY (Patient taking differently: 55 Units daily. ADMINISTER 55 UNITS UNDER THE SKIN DAILY ? ?Takes in the AM) 51 mL 2  ? insulin lispro (HUMALOG KWIKPEN) 100 UNIT/ML KwikPen DIAL AND INJECT 15 UNITS UNDER THE SKIN 3 TIMES DAILY. 15 mL 3  ? lisinopril (ZESTRIL) 40 MG tablet Take 1 tablet (40 mg total) by mouth daily. 90 tablet 1  ? loratadine (CLARITIN) 10 MG tablet Take 10 mg by mouth daily.    ? Magnesium 250  MG TABS Take 250 mg by mouth daily.    ? meloxicam (MOBIC) 15 MG tablet Take 1 tablet (15 mg total) by mouth daily as needed for pain. Take daily for 10 days and then as needed. 90 tablet 0  ? metFORMIN (GLUCOPHAGE XR) 500 MG 24 hr tablet Take 2 tablets (1,000 mg total) by mouth daily with breakfast. 180 tablet 1  ? Multiple Vitamins-Minerals (MULTIVITAMIN WITH MINERALS) tablet Take 1 tablet by mouth daily. Centrum 50+    ? ONETOUCH ULTRA test strip USE AS DIRECTED TO TEST BLOOD SUGAR TWICE DAILY 100 strip 1  ? oxyCODONE-acetaminophen (PERCOCET/ROXICET) 5-325 MG tablet Take 1 tablet by mouth every 6 (six) hours as needed for moderate pain. 20 tablet 0  ? triamcinolone cream (KENALOG) 0.1 % Apply 1 application topically 2 (two) times daily. (Patient taking differently: Apply 1 application. topically 2 (two) times daily as needed (irritation).) 30 g 0  ? ?No current facility-administered medications for this visit.  ? ? ?PHYSICAL EXAM ?There were no vitals filed for this visit. ? ? ?Constitutional: Well-appearing middle-aged man in no acute distress. ?Cardiac: Regular rate and rhythm.  ?Respiratory: unlabored. ?Abdominal:  soft, non-tender, non-distended.  ?Peripheral vascular: No palpable pedal pulses bilaterally ?Extremity: no edema. no cyanosis. no  pallor.  ?Skin: no gangrene. no ulceration.  ?Lymphatic: no Stemmer's sign. no palpable lymphadenopathy. ? ?PERTINENT LABORATORY AND RADIOLOGIC DATA ? ?Most recent CBC ? ?  Latest Ref Rng & Units 01/15/2022

## 2022-01-29 ENCOUNTER — Ambulatory Visit: Payer: Medicare PPO | Admitting: Vascular Surgery

## 2022-01-29 ENCOUNTER — Ambulatory Visit (INDEPENDENT_AMBULATORY_CARE_PROVIDER_SITE_OTHER)
Admit: 2022-01-29 | Discharge: 2022-01-29 | Disposition: A | Discharge status: Home / Self Care | Payer: Medicare PPO | Attending: Vascular Surgery | Admitting: Vascular Surgery

## 2022-01-29 ENCOUNTER — Ambulatory Visit (HOSPITAL_COMMUNITY)
Admission: RE | Admit: 2022-01-29 | Discharge: 2022-01-29 | Disposition: A | Discharge status: Home / Self Care | Payer: Medicare PPO | Source: Ambulatory Visit | Attending: Vascular Surgery | Admitting: Vascular Surgery

## 2022-01-29 ENCOUNTER — Encounter: Payer: Self-pay | Admitting: Vascular Surgery

## 2022-01-29 VITALS — BP 134/78 | HR 89 | Temp 99.7°F | Resp 20 | Ht 70.0 in | Wt 213.0 lb

## 2022-01-29 DIAGNOSIS — I70221 Atherosclerosis of native arteries of extremities with rest pain, right leg: Secondary | ICD-10-CM

## 2022-01-29 DIAGNOSIS — I739 Peripheral vascular disease, unspecified: Secondary | ICD-10-CM

## 2022-01-30 ENCOUNTER — Ambulatory Visit (INDEPENDENT_AMBULATORY_CARE_PROVIDER_SITE_OTHER): Payer: Medicare PPO | Admitting: Family Medicine

## 2022-01-30 ENCOUNTER — Encounter: Payer: Self-pay | Admitting: Family Medicine

## 2022-01-30 ENCOUNTER — Other Ambulatory Visit: Payer: Self-pay | Admitting: Family Medicine

## 2022-01-30 VITALS — BP 158/74 | HR 98 | Ht 70.0 in | Wt 211.0 lb

## 2022-01-30 DIAGNOSIS — E1165 Type 2 diabetes mellitus with hyperglycemia: Secondary | ICD-10-CM

## 2022-01-30 DIAGNOSIS — Z794 Long term (current) use of insulin: Secondary | ICD-10-CM

## 2022-01-30 DIAGNOSIS — I10 Essential (primary) hypertension: Secondary | ICD-10-CM | POA: Diagnosis not present

## 2022-01-30 DIAGNOSIS — I739 Peripheral vascular disease, unspecified: Secondary | ICD-10-CM

## 2022-01-30 DIAGNOSIS — F17210 Nicotine dependence, cigarettes, uncomplicated: Secondary | ICD-10-CM

## 2022-01-30 MED ORDER — CHANTIX STARTING MONTH PAK 0.5 MG X 11 & 1 MG X 42 PO TBPK: ORAL_TABLET | ORAL | 0 refills | Status: DC

## 2022-01-30 MED ORDER — OXYCODONE-ACETAMINOPHEN 5-325 MG PO TABS: 1.0000 | ORAL_TABLET | Freq: Four times a day (QID) | ORAL | 0 refills | Status: DC | PRN

## 2022-01-30 NOTE — Addendum Note (Signed)
Addended by: Mammie Lorenzo on: 01/30/2022 01:54 PM ? ? Modules accepted: Orders ? ?

## 2022-01-30 NOTE — Assessment & Plan Note (Signed)
He would like to try Chantix again.  Rx sent in for starter pack.   ?

## 2022-01-30 NOTE — Assessment & Plan Note (Signed)
S/p R Fem-Pop bypass.  Overall doing well.  Having some increased pain, percocet renewed today.   ?

## 2022-01-30 NOTE — Assessment & Plan Note (Signed)
BP elevated today.  Recommend low sodium diet and smoking cessation.  Take current medications regularly.  ?

## 2022-01-30 NOTE — Progress Notes (Signed)
?Erik Rose - 66 y.o. male MRN [id number]  Date of birth: 1956-09-22 ? ?Subjective ?No chief complaint on file. ? ? ?HPI ?Erik Rose is a 66 y.o. male here today for follow up visit.  Had right fem-pop bypass at the end of March.  Has had some increased swelling and since procedure.  He followed up with vascular surgeon yesterday and had complaint of increased pain.  Noted to have some mild dehiscence of one of the wounds.  He reports that vascular surgeon was supposed to renew pain medication once more but thinks he forgot.  He is keeping leg in compression wrap for now.  ? ?Additionally he is interested in quitting smoking.  He has had some success with Chantix previously and would like to restart this.   ? ?He would also like to see podiatry for foot and nail care due to his history of diabetes and vascular disease.  ? ?ROS:  A comprehensive ROS was completed and negative except as noted per HPI ? ?No Known Allergies ? ?Past Medical History:  ?Diagnosis Date  ? Anemia   ? Diabetes mellitus without complication (HCC)   ? Diverticulitis   ? GERD (gastroesophageal reflux disease)   ? Heart disease   ? History of kidney stones   ? passed  ? Hypertension   ? Myocardial infarction [facility])   ? "small one, blood numbers did not rise.  ? Peripheral vascular disease (HCC)   ? Stroke [facility]) 2018  ? no residual effects  ? TIA (transient ischemic attack) 2017  ? ? ?Past Surgical History:  ?Procedure Laterality Date  ? ABDOMINAL AORTOGRAM W/LOWER EXTREMITY N/A 12/28/2021  ? Procedure: ABDOMINAL AORTOGRAM W/LOWER EXTREMITY;  Surgeon: [name], MD;  [facility]: [facility];  Service: Cardiovascular;  Laterality: N/A;  ? BYPASS GRAFT FEMORAL-PERONEAL Right 01/02/2022  ? Procedure: RIGHT FEMORAL-PERONEAL BYPASS GRAFT;  Surgeon: [name], MD;  Location: [facility];  Service: Vascular;  Laterality: Right;  ? CARDIAC CATHETERIZATION    ? CORONARY ANGIOPLASTY WITH STENT PLACEMENT  2010  ? LEG ANGIOGRAPHY  Right 01/02/2022  ? Procedure: LEG ANGIOGRAPHY;  Surgeon: [name], MD;  Location: [facility] OR;  Service: Vascular;  Laterality: Right;  ? ? ?Social History  ? ?Socioeconomic History  ? Marital status: Married  ?  Spouse name: [name]  ? Number of children: 4  ? Years of education: 26  ? Highest education level: Associate degree: academic program  ?Occupational History  ?  Comment: Disability/retired.  ?Tobacco Use  ? Smoking status: Every Day  ?  Packs/day: 0.50  ?  Years: 38.00  ?  Pack years: 19.00  ?  Types: Cigarettes  ?  Passive exposure: Never  ? Smokeless tobacco: Never  ? Tobacco comments:  ?  5-8 cigarettes a day  ?Vaping Use  ? Vaping Use: Never used  ?Substance and Sexual Activity  ? Alcohol use: Not Currently  ?  Comment: RARE  ? Drug use: Not Currently  ?  Types: Marijuana, Cocaine  ?  Comment: stopped 1991  ? Sexual activity: Yes  ?  Partners: Female  ?Other Topics Concern  ? Not on file  ?Social History Narrative  ? Live alone. 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 Likes to [employer] range once a month.   ? ?Social Determinants of Health  ? ?Financial Resource Strain: Not on file  ?Food Insecurity: Not on file  ?Transportation Needs: Not on file  ?Physical Activity: Not on  file  ?Stress: Not on file  ?Social Connections: Not on file  ? ? ?Family History  ?Problem Relation Age of Onset  ? Hypertension Mother   ? Kidney disease Mother   ? Miscarriages / [place] Mother   ? Stroke Mother   ? Heart disease Father   ? Depression Sister   ? Kidney disease Sister   ? Diabetes Brother   ? Hypertension Brother   ? Hyperlipidemia Brother   ? Kidney disease Brother   ? ? ?Health Maintenance  ?Topic Date Due  ? FOOT EXAM  06/07/2022 (Originally 12/06/2021)  ? COLONOSCOPY (Pts 45-78yrs Insurance coverage will need to be confirmed)  06/07/2022 (Originally 01/17/2019)  ? COVID-19 Vaccine (4 - Booster) 12/01/2022 (Originally 11/18/2020)  ? Hepatitis C Screening  01/31/2023 (Originally 08/13/1975)  ? INFLUENZA VACCINE  05/07/2022   ? HEMOGLOBIN A1C  07/05/2022  ? OPHTHALMOLOGY EXAM  08/27/2022  ? TETANUS/TDAP  07/19/2030  ? HIV Screening  Completed  ? Zoster Vaccines- Shingrix  Completed  ? HPV VACCINES  Aged Out  ? ? ? ?----------------------------------------------------------------------------------------------------------------------------------------------------------------------------------------------------------------- ?Physical Exam ?BP (!) 158/74 (BP Location: Left Arm, Patient Position: Sitting, Cuff Size: Normal)   Pulse 98   Ht 5\' 10"  (1.778 m)   Wt 211 lb (95.7 kg)   SpO2 99%   BMI 30.28 kg/m?  ? ?Physical Exam ?Constitutional:   ?   Appearance: Normal appearance.  ?Eyes:  ?   General: No scleral icterus. ?Cardiovascular:  ?   Rate and Rhythm: Normal rate and regular rhythm.  ?Musculoskeletal:  ?   Cervical back: Neck supple.  ?Neurological:  ?   General: No focal deficit present.  ?   Mental Status: He is alert.  ?Psychiatric:     ?   Mood and Affect: Mood normal.     ?   Behavior: Behavior normal.  ? ? ?------------------------------------------------------------------------------------------------------------------------------------------------------------------------------------------------------------------- ?Assessment and Plan ? ?Essential hypertension ?BP elevated today.  Recommend low sodium diet and smoking cessation.  Take current medications regularly.  ? ?PAD (peripheral artery disease) (HCC) ?S/p R Fem-Pop bypass.  Overall doing well.  Having some increased pain, percocet renewed today.   ? ?Smoking greater than 20 pack years ?He would like to try Chantix again.  Rx sent in for starter pack.   ? ? ?Meds ordered this encounter  ?Medications  ? Varenicline Tartrate, Starter, (CHANTIX STARTING MONTH PAK) 0.5 MG X 11 & 1 MG X 42 TBPK  ?  Sig: Take as directed on packaging.  ?  Dispense:  53 each  ?  Refill:  0  ? oxyCODONE-acetaminophen (PERCOCET/ROXICET) 5-325 MG tablet  ?  Sig: Take 1 tablet by mouth every 6  (six) hours as needed for moderate pain.  ?  Dispense:  20 tablet  ?  Refill:  0  ? ? ?No follow-ups on file. ? ? ? ?This visit occurred during the SARS-CoV-2 public health emergency.  Safety protocols were in place, including screening questions prior to the visit, additional usage of staff PPE, and extensive cleaning of exam room while observing appropriate contact time as indicated for disinfecting solutions.  ? ?

## 2022-02-05 ENCOUNTER — Ambulatory Visit (INDEPENDENT_AMBULATORY_CARE_PROVIDER_SITE_OTHER): Payer: Medicare PPO | Admitting: Vascular Surgery

## 2022-02-05 ENCOUNTER — Encounter: Payer: Self-pay | Admitting: Vascular Surgery

## 2022-02-05 VITALS — BP 128/78 | HR 90 | Temp 99.7°F | Resp 20 | Ht 70.0 in | Wt 211.0 lb

## 2022-02-05 DIAGNOSIS — T8131XA Disruption of external operation (surgical) wound, not elsewhere classified, initial encounter: Secondary | ICD-10-CM

## 2022-02-05 NOTE — Progress Notes (Signed)
[facility] [place] ? ?ASSESSMENT / PLAN: ?Erik Rose is a 66 y.o. male with atherosclerosis of native arteries of right lower extremity status post right common femoral to peroneal bypass with in-situ greater saphenous vein 01/02/22.  ? ?Recommend the following which can slow the progression of atherosclerosis and reduce the risk of major adverse cardiac / limb events:  ?Complete cessation from all tobacco products. ?Blood glucose control with goal A1c < 7%. ?Blood pressure control with goal blood pressure < 140/90 mmHg. ?Lipid reduction therapy with goal LDL-C <100 mg/dL (<70 if symptomatic from PAD).  ?Aspirin 60m PO QD.  ?Atorvastatin 40-80mg PO QD (or other "high intensity" statin therapy). ? ?Will start wet-to-dry to calf wound. Change daily. Wrap with ACE. I instructed the home health aide personally. Follow up with me in 1-2 weeks. ? ?CHIEF COMPLAINT: right foot pain ? ?HISTORY OF PRESENT ILLNESS: ?Erik MCCARTERis a 66y.o. male referred to clinic for evaluation of peripheral arterial disease.  The patient has a fairly classic history of ischemic rest pain of the right foot.  He says this has been going on for several months.  He reports pain at the ball of his foot which occasionally awakens him from sleep.  He does not have any ulcers on his feet.  He reports some antecedent claudication.  He is a smoker.  He is a diabetic who is trying to improve his control. ? ?01/29/22: Returns to clinic reporting significant swelling in his leg.  This is quite bothersome to him.  He has had some superficial dehiscence of the left calf incision.  Ischemic rest pain has resolved. ? ?02/05/22: Returns to clinic for wound check. Small dehiscence persists. Wound unchanged, perhaps slightly worse. ? ?VASCULAR SURGICAL HISTORY: none ? ?VASCULAR RISK FACTORS: ?Negative history of stroke / transient ischemic attack. ?Positive history of coronary artery disease. + history of PCI.  ?Positive  history of diabetes mellitus. Last A1c 7.5. ?Positive history of smoking. + actively smoking. ?Positive history of hypertension.  ?Negative history of chronic kidney disease.  Last GFR >60.  ?Negative history of chronic obstructive pulmonary disease. ? ?FUNCTIONAL STATUS: ?ECOG performance status: (0) Fully active, able to carry on all predisease performance without restriction ?Ambulatory status: Ambulatory within the community with limits ? ?Past Medical History:  ?Diagnosis Date  ? Anemia   ? [place]   ? Diverticulitis   ? GERD (gastroesophageal reflux disease)   ? Heart disease   ? History of kidney stones   ? passed  ? Hypertension   ? Myocardial infarction ([facility]   ? "small one, blood numbers did not rise.  ? [place]ipheral vascular disease [place]   ? Stroke [facility] 2018  ? no residual effects  ? TIA (transient ischemic attack) 2017  ? ? ?Past Surgical History:  ?Procedure Laterality Date  ? ABDOMINAL AORTOGRAM W/LOWER EXTREMITY N/A 12/28/2021  ? Procedure: ABDOMINAL AORTOGRAM W/LOWER EXTREMITY;  Surgeon: HCherre Robins MD;  [facility] [place];  Service: Cardiovascular;  Laterality: N/A;  ? BYPASS GRAFT FEMORAL-PERONEAL Right 01/02/2022  ? Procedure: RIGHT FEMORAL-PERONEAL BYPASS GRAFT;  Surgeon: HCherre Robins MD;  [facility];  Service: Vascular;  Laterality: Right;  ? CARDIAC CATHETERIZATION    ? CORONARY ANGIOPLASTY WITH STENT PLACEMENT  2010  ? LEG ANGIOGRAPHY Right 01/02/2022  ? Procedure: LEG ANGIOGRAPHY;  Surgeon: HCherre Robins MD;  [place]  Service: Vascular;  Laterality: Right;  ? ? ?Family  History  ?Problem Relation Age of Onset  ? Hypertension Mother   ? Kidney disease Mother   ? Miscarriages / [place] Mother   ? Stroke Mother   ? Heart disease Father   ? Depression Sister   ? Kidney disease Sister   ? Diabetes Brother   ? Hypertension Brother   ? Hyperlipidemia Brother   ? Kidney disease Brother   ? ? ?Social History  ? ?Socioeconomic  History  ? Marital status: Married  ?  Spouse name: [name]  ? Number of children: 4  ? Years of education: 24  ? Highest education level: Associate degree: academic program  ?Occupational History  ?  Comment: Disability/retired.  ?Tobacco Use  ? Smoking status: Every Day  ?  Packs/day: 0.50  ?  Years: 38.00  ?  Pack years: 19.00  ?  Types: Cigarettes  ?  Passive exposure: Never  ? Smokeless tobacco: Never  ? Tobacco comments:  ?  5-8 cigarettes a day  ?Vaping Use  ? Vaping Use: Never used  ?Substance and Sexual Activity  ? Alcohol use: Not Currently  ?  Comment: RARE  ? Drug use: Not Currently  ?  Types: Marijuana, Cocaine  ?  Comment: stopped 1991  ? Sexual activity: Yes  ?  Partners: Female  ?Other Topics Concern  ? Not on file  ?Social History Narrative  ? 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   ? ?Social Determinants of Health  ? ?Financial Resource Strain: Not on file  ?Food Insecurity: Not on file  ?Transportation Needs: Not on file  ?Physical Activity: Not on file  ?Stress: Not on file  ?Social Connections: Not on file  ?Intimate Partner Violence: Not on file  ? ? ?No Known Allergies ? ?Current Outpatient Medications  ?Medication Sig Dispense Refill  ? AMBULATORY NON FORMULARY MEDICATION Bedside commode 1 each 0  ? AMBULATORY NON FORMULARY MEDICATION Please provide 3 in 1 bedside commode for patient.  Diagnosis:  Post operative state Z98.890, Gait instability R26.81 1 Device 0  ? amLODipine (NORVASC) 5 MG tablet Take 1 tablet (5 mg total) by mouth daily. 90 tablet 3  ? aspirin EC 81 MG tablet Take 1 tablet (81 mg total) by mouth daily. Swallow whole.    ? atorvastatin (LIPITOR) 80 MG tablet Take 1 tablet (80 mg total) by mouth daily. 90 tablet 3  ? Blood Glucose Monitoring Suppl (ONE TOUCH ULTRA 2) w/Device KIT Use to take blood sugar one to two times daily 1 each 0  ? carvedilol (COREG) 12.5 MG tablet TAKE 1 TABLET(12.5 MG) BY MOUTH TWICE DAILY WITH A MEAL 180 tablet 1   ? esomeprazole (NEXIUM) 20 MG capsule Take 20 mg by mouth every morning. OTC    ? gabapentin (NEURONTIN) 300 MG capsule One tab PO qHS for a week, then BID for a week, then TID. May double weekly to a max of 3,67m/day (Patient taking differently: Take 300-600 mg by mouth at bedtime as needed (pain).) 90 capsule 3  ? insulin glargine (LANTUS SOLOSTAR) 100 UNIT/ML Solostar Pen ADMINISTER 55 UNITS UNDER THE SKIN DAILY (Patient taking differently: 55 Units daily. ADMINISTER 55 UNITS UNDER THE SKIN DAILY ? ?Takes in the AM) 51 mL 2  ? insulin lispro (HUMALOG KWIKPEN) 100 UNIT/ML KwikPen DIAL AND INJECT 15 UNITS UNDER THE SKIN 3 TIMES DAILY. 15 mL 3  ? lisinopril (ZESTRIL) 40 MG tablet Take 1 tablet (40 mg total)  by mouth daily. 90 tablet 1  ? loratadine (CLARITIN) 10 MG tablet Take 10 mg by mouth daily.    ? Magnesium 250 MG TABS Take 250 mg by mouth daily.    ? meloxicam (MOBIC) 15 MG tablet Take 1 tablet (15 mg total) by mouth daily as needed for pain. Take daily for 10 days and then as needed. 90 tablet 0  ? metFORMIN (GLUCOPHAGE XR) 500 MG 24 hr tablet Take 2 tablets (1,000 mg total) by mouth daily with breakfast. 180 tablet 1  ? Multiple Vitamins-Minerals (MULTIVITAMIN WITH MINERALS) tablet Take 1 tablet by mouth daily. Centrum 50+    ? ONETOUCH ULTRA test strip USE AS DIRECTED TO TEST BLOOD SUGAR TWICE DAILY 100 strip 1  ? oxyCODONE-acetaminophen (PERCOCET/ROXICET) 5-325 MG tablet Take 1 tablet by mouth every 6 (six) hours as needed for moderate pain. 20 tablet 0  ? triamcinolone cream (KENALOG) 0.1 % Apply 1 application topically 2 (two) times daily. (Patient taking differently: Apply 1 application. topically 2 (two) times daily as needed (irritation).) 30 g 0  ? Varenicline Tartrate, Starter, (CHANTIX STARTING MONTH PAK) 0.5 MG X 11 & 1 MG X 42 TBPK Take as directed on packaging. 53 each 0  ? ?No current facility-administered medications for this visit.  ? ? ?PHYSICAL EXAM ?Vitals:  ? 02/05/22 1111  ?BP:  128/78  ?Pulse: 90  ?Resp: 20  ?Temp: 99.7 ?F (37.6 ?C)  ?SpO2: 100%  ?Weight: 211 lb (95.7 kg)  ?Height: '5\' 10"'  (1.778 m)  ? ? ? ?Constitutional: Well-appearing middle-aged man in no acute distress. ?Card

## 2022-02-07 ENCOUNTER — Ambulatory Visit (INDEPENDENT_AMBULATORY_CARE_PROVIDER_SITE_OTHER): Payer: Self-pay | Admitting: Podiatry

## 2022-02-07 DIAGNOSIS — Z91199 Patient's noncompliance with other medical treatment and regimen due to unspecified reason: Secondary | ICD-10-CM

## 2022-02-07 NOTE — Progress Notes (Signed)
No show

## 2022-02-08 ENCOUNTER — Encounter: Payer: Self-pay | Admitting: Podiatry

## 2022-02-08 ENCOUNTER — Ambulatory Visit (INDEPENDENT_AMBULATORY_CARE_PROVIDER_SITE_OTHER): Payer: Medicare PPO | Admitting: Podiatry

## 2022-02-08 DIAGNOSIS — I739 Peripheral vascular disease, unspecified: Secondary | ICD-10-CM | POA: Diagnosis not present

## 2022-02-08 DIAGNOSIS — Z794 Long term (current) use of insulin: Secondary | ICD-10-CM | POA: Diagnosis not present

## 2022-02-08 DIAGNOSIS — E1165 Type 2 diabetes mellitus with hyperglycemia: Secondary | ICD-10-CM | POA: Diagnosis not present

## 2022-02-08 DIAGNOSIS — B351 Tinea unguium: Secondary | ICD-10-CM | POA: Diagnosis not present

## 2022-02-08 DIAGNOSIS — M79674 Pain in right toe(s): Secondary | ICD-10-CM | POA: Diagnosis not present

## 2022-02-08 NOTE — Progress Notes (Signed)
?  Subjective:  ?Patient ID: Erik Rose, male    DOB: [birth year],   MRN: [id number] ? ?Chief Complaint  ?Patient presents with  ? Diabetes  ?  PCP referred - diabetic foot exam - right leg is wrapped due to just having bypass, last a1c was 7.5 per patient  ? New Patient (Initial Visit)  ? ? ?66 y.o. male presents for concern of thickened elongated and painful nails that are difficult to trim. Requesting to have them trimmed today. Just recently underwent right leg bypass and legs are wrapped.  Relates burning and tingling in their feet. Patient is diabetic and last A1c was  ?Lab Results  ?Component Value Date  ? HGBA1C 8.6 (H) 01/02/2022  ? .  ? ?PCP:  [name], DO   ? ? Marland Kitchen Denies any other pedal complaints. Denies n/v/f/c.  ? ?Past Medical History:  ?Diagnosis Date  ? Anemia   ? Diabetes mellitus without complication ([place])   ? Diverticulitis   ? GERD (gastroesophageal reflux disease)   ? Heart disease   ? History of kidney stones   ? passed  ? Hypertension   ? Myocardial infarction [facility])   ? "small one, blood numbers did not rise.  ? Peripheral vascular disease ([place])   ? Stroke [facility]) 2018  ? no residual effects  ? TIA (transient ischemic attack) 2017  ? ? ?Objective:  ?Physical Exam: ?Vascular: DP/PT pulses 1/4 bilateral. CFT <3 seconds. Absent hair growth on digits. Edema noted to bilateral lower extremities. Xerosis noted bilaterally. Right leg wrapped due to recent bypass.  ?Skin. No lacerations or abrasions bilateral feet. Nails 1-5 bilateral  are thickened discolored and elongated with subungual debris.  ?Musculoskeletal: MMT 5/5 bilateral lower extremities in DF, PF, Inversion and Eversion. Deceased ROM in DF of ankle joint.  ?Neurological: Sensation intact to light touch. Protective sensation diminished bilateral.  ? ? ?Assessment:  ? ?1. Type 2 diabetes mellitus with hyperglycemia, with long-term current use of insulin ([place])   ?2. PAD (peripheral artery disease) ([place])   ?3. Pain due to  onychomycosis of toenails of both feet   ? ? ? ?Plan:  ?Patient was evaluated and treated and all questions answered. ?-Discussed and educated patient on diabetic foot care, especially with  ?regards to the vascular, neurological and musculoskeletal systems.  ?-Stressed the importance of good glycemic control and the detriment of not  ?controlling glucose levels in relation to the foot. ?-Discussed supportive shoes at all times and checking feet regularly.  ?-Mechanically debrided all nails 1-5 bilateral using sterile nail nipper and filed with dremel without incident  ?-Answered all patient questions ?-Patient to return  in 3 months for at risk foot care ?-Patient advised to call the office if any problems or questions arise in the meantime. ? ? ?Lorenda Peck, DPM  ? ? ?

## 2022-02-11 ENCOUNTER — Telehealth: Payer: Self-pay

## 2022-02-11 NOTE — Telephone Encounter (Signed)
Pt called to let us know his wound is unchanged from his appt last week. He is scheduled for f/u next week with MD. Pt has not been doing the wet to dry dressings, as discussed at his last visit. We went over this and he will start doing them today with the help of his aide. No further questions/concerns at this time.  ?

## 2022-02-12 ENCOUNTER — Other Ambulatory Visit: Payer: Self-pay | Admitting: Vascular Surgery

## 2022-02-12 MED ORDER — OXYCODONE-ACETAMINOPHEN 5-325 MG PO TABS: 1.0000 | ORAL_TABLET | Freq: Four times a day (QID) | ORAL | 0 refills | Status: DC | PRN

## 2022-02-13 ENCOUNTER — Encounter: Payer: Self-pay | Admitting: Vascular Surgery

## 2022-02-18 NOTE — Progress Notes (Signed)
[facility] [place] ? ?ASSESSMENT / PLAN: ?Erik Rose is a 66 y.o. male with atherosclerosis of native arteries of right lower extremity status post right common femoral to peroneal bypass with in-situ greater saphenous vein 01/02/22.  ? ?Recommend the following which can slow the progression of atherosclerosis and reduce the risk of major adverse cardiac / limb events:  ?Complete cessation from all tobacco products. ?Blood glucose control with goal A1c < 7%. ?Blood pressure control with goal blood pressure < 140/90 mmHg. ?Lipid reduction therapy with goal LDL-C <100 mg/dL (<70 if symptomatic from PAD).  ?Aspirin 61m PO QD.  ?Atorvastatin 40-80mg PO QD (or other "high intensity" statin therapy). ? ?Will start wet-to-dry to calf wound. Change daily. Wrap with ACE. I instructed the home health aide personally. Follow up with me in 1 month ? ?CHIEF COMPLAINT: right foot pain ? ?HISTORY OF PRESENT ILLNESS: ?Erik ZAVADILis a 66y.o. male referred to clinic for evaluation of peripheral arterial disease.  The patient has a fairly classic history of ischemic rest pain of the right foot.  He says this has been going on for several months.  He reports pain at the ball of his foot which occasionally awakens him from sleep.  He does not have any ulcers on his feet.  He reports some antecedent claudication.  He is a smoker.  He is a diabetic who is trying to improve his control. ? ?01/29/22: Returns to clinic reporting significant swelling in his leg.  This is quite bothersome to him.  He has had some superficial dehiscence of the left calf incision.  Ischemic rest pain has resolved. ? ?02/05/22: Returns to clinic for wound check. Small dehiscence persists. Wound unchanged, perhaps slightly worse. ? ?02/19/22: Wound appears to be more shallow. He is changing the wound daily with wet-to-dry. He continues to smoke.  ? ?VASCULAR SURGICAL HISTORY: none ? ?VASCULAR RISK FACTORS: ?Negative  history of stroke / transient ischemic attack. ?Positive history of coronary artery disease. + history of PCI.  ?Positive history of diabetes mellitus. Last A1c 7.5. ?Positive history of smoking. + actively smoking. ?Positive history of hypertension.  ?Negative history of chronic kidney disease.  Last GFR >60.  ?Negative history of chronic obstructive pulmonary disease. ? ?FUNCTIONAL STATUS: ?ECOG performance status: (0) Fully active, able to carry on all predisease performance without restriction ?Ambulatory status: Ambulatory within the community with limits ? ?Past Medical History:  ?Diagnosis Date  ? Anemia   ? Diabetes mellitus without complication ([facility]   ? Diverticulitis   ? GERD (gastroesophageal reflux disease)   ? Heart disease   ? History of kidney stones   ? passed  ? Hypertension   ? Myocardial infarction ([facility]   ? "small one, blood numbers did not rise.  ? Peripheral vascular disease ([facility]   ? Stroke ([facility] 2018  ? no residual effects  ? TIA (transient ischemic attack) 2017  ? ? ?Past Surgical History:  ?Procedure Laterality Date  ? ABDOMINAL AORTOGRAM W/LOWER EXTREMITY N/A 12/28/2021  ? Procedure: ABDOMINAL AORTOGRAM W/LOWER EXTREMITY;  Surgeon: HCherre Robins MD;  [facility];  Service: Cardiovascular;  Laterality: N/A;  ? BYPASS GRAFT FEMORAL-PERONEAL Right 01/02/2022  ? Procedure: RIGHT FEMORAL-PERONEAL BYPASS GRAFT;  Surgeon: HCherre Robins MD;  [facility];  Service: Vascular;  Laterality: Right;  ? CARDIAC CATHETERIZATION    ? CORONARY ANGIOPLASTY WITH STENT PLACEMENT  2010  ? LEG ANGIOGRAPHY Right 01/02/2022  ? Procedure: LEG  ANGIOGRAPHY;  Surgeon: Cherre Robins, MD;  Location: [place] [facility];  Service: Vascular;  Laterality: Right;  ? ? ?Family History  ?Problem Relation Age of Onset  ? Hypertension Mother   ? Kidney disease Mother   ? Miscarriages / [place] Mother   ? Stroke Mother   ? Heart disease Father   ? Depression Sister   ? Kidney disease Sister   ? Diabetes  Brother   ? Hypertension Brother   ? Hyperlipidemia Brother   ? Kidney disease Brother   ? ? ?Social History  ? ?Socioeconomic History  ? Marital status: Married  ?  Spouse name: [name]  ? Number of children: 4  ? Years of education: 9  ? Highest education level: Associate degree: academic program  ?Occupational History  ?  Comment: Disability/retired.  ?Tobacco Use  ? Smoking status: Every Day  ?  Packs/day: 0.50  ?  Years: 38.00  ?  Pack years: 19.00  ?  Types: Cigarettes  ?  Passive exposure: Never  ? Smokeless tobacco: Never  ? Tobacco comments:  ?  5-8 cigarettes a day  ?Vaping Use  ? Vaping Use: Never used  ?Substance and Sexual Activity  ? Alcohol use: Not Currently  ?  Comment: RARE  ? Drug use: Not Currently  ?  Types: Marijuana, Cocaine  ?  Comment: stopped 1991  ? Sexual activity: Yes  ?  Partners: Female  ?Other Topics Concern  ? Not on file  ?Social History Narrative  ? 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   ? ?Social Determinants of Health  ? ?Financial Resource Strain: Not on file  ?Food Insecurity: Not on file  ?Transportation Needs: Not on file  ?Physical Activity: Not on file  ?Stress: Not on file  ?Social Connections: Not on file  ?Intimate Partner Violence: Not on file  ? ? ?No Known Allergies ? ?Current Outpatient Medications  ?Medication Sig Dispense Refill  ? AMBULATORY NON FORMULARY MEDICATION Bedside commode 1 each 0  ? AMBULATORY NON FORMULARY MEDICATION Please provide 3 in 1 bedside commode for patient.  Diagnosis:  Post operative state Z98.890, Gait instability R26.81 1 Device 0  ? amLODipine (NORVASC) 5 MG tablet Take 1 tablet (5 mg total) by mouth daily. 90 tablet 3  ? aspirin EC 81 MG tablet Take 1 tablet (81 mg total) by mouth daily. Swallow whole.    ? atorvastatin (LIPITOR) 80 MG tablet Take 1 tablet (80 mg total) by mouth daily. 90 tablet 3  ? Blood Glucose Monitoring Suppl (ONE TOUCH ULTRA 2) w/Device KIT Use to take blood sugar one to two  times daily 1 each 0  ? carvedilol (COREG) 12.5 MG tablet TAKE 1 TABLET(12.5 MG) BY MOUTH TWICE DAILY WITH A MEAL 180 tablet 1  ? esomeprazole (NEXIUM) 20 MG capsule Take 20 mg by mouth every morning. OTC    ? gabapentin (NEURONTIN) 300 MG capsule One tab PO qHS for a week, then BID for a week, then TID. May double weekly to a max of 3,611m/day (Patient taking differently: Take 300-600 mg by mouth at bedtime as needed (pain).) 90 capsule 3  ? insulin glargine (LANTUS SOLOSTAR) 100 UNIT/ML Solostar Pen ADMINISTER 55 UNITS UNDER THE SKIN DAILY (Patient taking differently: 55 Units daily. ADMINISTER 55 UNITS UNDER THE SKIN DAILY ? ?Takes in the AM) 51 mL 2  ? insulin lispro (HUMALOG KWIKPEN) 100 UNIT/ML KwikPen DIAL AND INJECT 15 UNITS UNDER  THE SKIN 3 TIMES DAILY. 15 mL 3  ? lisinopril (ZESTRIL) 40 MG tablet Take 1 tablet (40 mg total) by mouth daily. 90 tablet 1  ? loratadine (CLARITIN) 10 MG tablet Take 10 mg by mouth daily.    ? Magnesium 250 MG TABS Take 250 mg by mouth daily.    ? meloxicam (MOBIC) 15 MG tablet Take 1 tablet (15 mg total) by mouth daily as needed for pain. Take daily for 10 days and then as needed. 90 tablet 0  ? metFORMIN (GLUCOPHAGE XR) 500 MG 24 hr tablet Take 2 tablets (1,000 mg total) by mouth daily with breakfast. 180 tablet 1  ? Multiple Vitamins-Minerals (MULTIVITAMIN WITH MINERALS) tablet Take 1 tablet by mouth daily. Centrum 50+    ? ONETOUCH ULTRA test strip USE AS DIRECTED TO TEST BLOOD SUGAR TWICE DAILY 100 strip 1  ? oxyCODONE-acetaminophen (PERCOCET/ROXICET) 5-325 MG tablet Take 1 tablet by mouth every 6 (six) hours as needed for moderate pain. 20 tablet 0  ? triamcinolone cream (KENALOG) 0.1 % Apply 1 application topically 2 (two) times daily. (Patient taking differently: Apply 1 application. topically 2 (two) times daily as needed (irritation).) 30 g 0  ? Varenicline Tartrate, Starter, (CHANTIX STARTING MONTH PAK) 0.5 MG X 11 & 1 MG X 42 TBPK Take as directed on packaging. 53  each 0  ? ?No current facility-administered medications for this visit.  ? ? ?PHYSICAL EXAM ?There were no vitals filed for this visit. ? ? ? ?Constitutional: Well-appearing middle-aged man in no acute distress. ?C

## 2022-02-19 ENCOUNTER — Ambulatory Visit (INDEPENDENT_AMBULATORY_CARE_PROVIDER_SITE_OTHER): Payer: Medicare PPO | Admitting: Vascular Surgery

## 2022-02-19 VITALS — BP 138/89 | HR 89 | Temp 99.1°F | Resp 20 | Ht 70.0 in | Wt 211.0 lb

## 2022-02-19 DIAGNOSIS — I739 Peripheral vascular disease, unspecified: Secondary | ICD-10-CM | POA: Diagnosis not present

## 2022-02-22 ENCOUNTER — Telehealth: Payer: Self-pay | Admitting: *Deleted

## 2022-02-22 NOTE — Telephone Encounter (Signed)
Patient called requesting pain medication for dressing changes and night as this is when pain is worse.  Wet to dry dressings is what is being applied.  I explained to patient that tylenol would need to be taken for this pain.  Patient is to call is worsening symptoms.  Patient verbalized understanding of the instructions.

## 2022-02-24 ENCOUNTER — Encounter: Payer: Self-pay | Admitting: Vascular Surgery

## 2022-02-25 ENCOUNTER — Encounter: Payer: Self-pay | Admitting: Cardiology

## 2022-02-25 ENCOUNTER — Telehealth: Payer: Self-pay

## 2022-02-25 NOTE — H&P (View-Only) (Signed)
POST OPERATIVE OFFICE NOTE    CC:  F/u for surgery  HPI:  This is a 66 y.o. male who is s/p 1) right common femoral to peroneal bypass with in-situ greater saphenous vein 2) right lower extremity angiogram by Dr. Stanford Breed on 01/02/22. He has had a right calf wound is being followed. He was last seen by Dr. Stanford Breed on 5/16. He was instructed to continue wet to dry dressings. He has assistance with home health aide for dressing changes. Patients daughter called yesterday with concerns about appearance of wound so he presents for earlier follow up today. He explains that he feels wound is getting larger. With assistance of [place] aid and also his youngest daughter who lives locally he is able to do wound changes daily. No significant pain in leg. No drainage or bleeding. No malodor. He denies any fever or chills. He ambulates either with rolling walker or without assistance at home   No Known Allergies  Current Outpatient Medications  Medication Sig Dispense Refill   AMBULATORY NON FORMULARY MEDICATION Bedside commode 1 each 0   AMBULATORY NON FORMULARY MEDICATION Please provide 3 in 1 bedside commode for patient.  Diagnosis:  Post operative state Z98.890, Gait instability R26.81 1 Device 0   amLODipine (NORVASC) 5 MG tablet Take 1 tablet (5 mg total) by mouth daily. 90 tablet 3   aspirin EC 81 MG tablet Take 1 tablet (81 mg total) by mouth daily. Swallow whole.     atorvastatin (LIPITOR) 80 MG tablet Take 1 tablet (80 mg total) by mouth daily. 90 tablet 3   Blood Glucose Monitoring Suppl (ONE TOUCH ULTRA 2) w/Device KIT Use to take blood sugar one to two times daily 1 each 0   carvedilol (COREG) 12.5 MG tablet TAKE 1 TABLET(12.5 MG) BY MOUTH TWICE DAILY WITH A MEAL 180 tablet 1   esomeprazole (NEXIUM) 20 MG capsule Take 20 mg by mouth every morning. OTC     gabapentin (NEURONTIN) 300 MG capsule One tab PO qHS for a week, then BID for a week, then TID. May double weekly to a max of 3,675m/day (Patient  taking differently: Take 300-600 mg by mouth at bedtime as needed (pain).) 90 capsule 3   insulin glargine (LANTUS SOLOSTAR) 100 UNIT/ML Solostar Pen ADMINISTER 55 UNITS UNDER THE SKIN DAILY (Patient taking differently: 55 Units daily. ADMINISTER 55 UNITS UNDER THE SKIN DAILY  Takes in the AM) 51 mL 2   insulin lispro (HUMALOG KWIKPEN) 100 UNIT/ML KwikPen DIAL AND INJECT 15 UNITS UNDER THE SKIN 3 TIMES DAILY. 15 mL 3   lisinopril (ZESTRIL) 40 MG tablet Take 1 tablet (40 mg total) by mouth daily. 90 tablet 1   loratadine (CLARITIN) 10 MG tablet Take 10 mg by mouth daily.     Magnesium 250 MG TABS Take 250 mg by mouth daily.     meloxicam (MOBIC) 15 MG tablet Take 1 tablet (15 mg total) by mouth daily as needed for pain. Take daily for 10 days and then as needed. 90 tablet 0   metFORMIN (GLUCOPHAGE XR) 500 MG 24 hr tablet Take 2 tablets (1,000 mg total) by mouth daily with breakfast. 180 tablet 1   Multiple Vitamins-Minerals (MULTIVITAMIN WITH MINERALS) tablet Take 1 tablet by mouth daily. Centrum 50+     ONETOUCH ULTRA test strip USE AS DIRECTED TO TEST BLOOD SUGAR TWICE DAILY 100 strip 1   oxyCODONE-acetaminophen (PERCOCET/ROXICET) 5-325 MG tablet Take 1 tablet by mouth every 6 (six) hours as needed for  moderate pain. 20 tablet 0   triamcinolone cream (KENALOG) 0.1 % Apply 1 application topically 2 (two) times daily. (Patient taking differently: Apply 1 application. topically 2 (two) times daily as needed (irritation).) 30 g 0   No current facility-administered medications for this visit.     ROS:  See HPI  Physical Exam:  There were no vitals filed for this visit.  General: well appearing, well nourished in no distress Incision: right leg saphenectomy site with dehiscence. Distal aspect of incision with fibrinous exudate. No purulence. Otherwise pink granulation tissue in wound bed. Other right leg incisions healed   Extremities:  RLE well perfused and warm Neuro: alert and  oriented   Assessment/Plan:  This is a 66 y.o. male who is s/p:1) right common femoral to peroneal bypass with in-situ greater saphenous vein 2) right lower extremity angiogram by Dr. Stanford Breed on 01/02/22. Dehiscence of right distal saphenectomy site. Some increased separation of wound. To assist with wound healing recommend I& D in OR with application of wound VAC.  - Patient examined with Dr. Stanford Breed who recommends debridement in OR and VAC application - Patient already established with Al[facility] Lemmings so will make arrangements for RN to assist with home wound VAC changes 3x per week at discharge - Will arrange this on Thursday 02/28/22 with Dr. Stanford Breed. This will be outpatient surgery. Discussed procedure with patient and his daughter on telephone   Karoline Caldwell, [place] [facility] [phone]  Clinic MD:  Roxanne Mins

## 2022-02-25 NOTE — Progress Notes (Unsigned)
POST OPERATIVE OFFICE NOTE    CC:  F/u for surgery  HPI:  This is a 66 y.o. male who is s/p 1) right common femoral to peroneal bypass with in-situ greater saphenous vein 2) right lower extremity angiogram by Dr. Stanford Breed on 01/02/22. He has had a right calf wound is being followed. He was last seen by Dr. Stanford Breed on 5/16. He was instructed to continue wet to dry dressings. He has assistance with home health aide for dressing changes. Patients daughter called yesterday with concerns about appearance of wound so he presents for earlier follow up today. He explains that he feels wound is getting larger. With assistance of [place] aid and also his youngest daughter who lives locally he is able to do wound changes daily. No significant pain in leg. No drainage or bleeding. No malodor. He denies any fever or chills. He ambulates either with rolling walker or without assistance at home   No Known Allergies  Current Outpatient Medications  Medication Sig Dispense Refill   AMBULATORY NON FORMULARY MEDICATION Bedside commode 1 each 0   AMBULATORY NON FORMULARY MEDICATION Please provide 3 in 1 bedside commode for patient.  Diagnosis:  Post operative state Z98.890, Gait instability R26.81 1 Device 0   amLODipine (NORVASC) 5 MG tablet Take 1 tablet (5 mg total) by mouth daily. 90 tablet 3   aspirin EC 81 MG tablet Take 1 tablet (81 mg total) by mouth daily. Swallow whole.     atorvastatin (LIPITOR) 80 MG tablet Take 1 tablet (80 mg total) by mouth daily. 90 tablet 3   Blood Glucose Monitoring Suppl (ONE TOUCH ULTRA 2) w/Device KIT Use to take blood sugar one to two times daily 1 each 0   carvedilol (COREG) 12.5 MG tablet TAKE 1 TABLET(12.5 MG) BY MOUTH TWICE DAILY WITH A MEAL 180 tablet 1   esomeprazole (NEXIUM) 20 MG capsule Take 20 mg by mouth every morning. OTC     gabapentin (NEURONTIN) 300 MG capsule One tab PO qHS for a week, then BID for a week, then TID. May double weekly to a max of 3,675m/day (Patient  taking differently: Take 300-600 mg by mouth at bedtime as needed (pain).) 90 capsule 3   insulin glargine (LANTUS SOLOSTAR) 100 UNIT/ML Solostar Pen ADMINISTER 55 UNITS UNDER THE SKIN DAILY (Patient taking differently: 55 Units daily. ADMINISTER 55 UNITS UNDER THE SKIN DAILY  Takes in the AM) 51 mL 2   insulin lispro (HUMALOG KWIKPEN) 100 UNIT/ML KwikPen DIAL AND INJECT 15 UNITS UNDER THE SKIN 3 TIMES DAILY. 15 mL 3   lisinopril (ZESTRIL) 40 MG tablet Take 1 tablet (40 mg total) by mouth daily. 90 tablet 1   loratadine (CLARITIN) 10 MG tablet Take 10 mg by mouth daily.     Magnesium 250 MG TABS Take 250 mg by mouth daily.     meloxicam (MOBIC) 15 MG tablet Take 1 tablet (15 mg total) by mouth daily as needed for pain. Take daily for 10 days and then as needed. 90 tablet 0   metFORMIN (GLUCOPHAGE XR) 500 MG 24 hr tablet Take 2 tablets (1,000 mg total) by mouth daily with breakfast. 180 tablet 1   Multiple Vitamins-Minerals (MULTIVITAMIN WITH MINERALS) tablet Take 1 tablet by mouth daily. Centrum 50+     ONETOUCH ULTRA test strip USE AS DIRECTED TO TEST BLOOD SUGAR TWICE DAILY 100 strip 1   oxyCODONE-acetaminophen (PERCOCET/ROXICET) 5-325 MG tablet Take 1 tablet by mouth every 6 (six) hours as needed for  moderate pain. 20 tablet 0   triamcinolone cream (KENALOG) 0.1 % Apply 1 application topically 2 (two) times daily. (Patient taking differently: Apply 1 application. topically 2 (two) times daily as needed (irritation).) 30 g 0   No current facility-administered medications for this visit.     ROS:  See HPI  Physical Exam:  There were no vitals filed for this visit.  General: well appearing, well nourished in no distress Incision: right leg saphenectomy site with dehiscence. Distal aspect of incision with fibrinous exudate. No purulence. Otherwise pink granulation tissue in wound bed. Other right leg incisions healed   Extremities:  RLE well perfused and warm Neuro: alert and  oriented   Assessment/Plan:  This is a 66 y.o. male who is s/p:1) right common femoral to peroneal bypass with in-situ greater saphenous vein 2) right lower extremity angiogram by Dr. Stanford Breed on 01/02/22. Dehiscence of right distal saphenectomy site. Some increased separation of wound. To assist with wound healing recommend I& D in OR with application of wound VAC.  - Patient examined with Dr. Stanford Breed who recommends debridement in OR and VAC application - Patient already established with [facility] Lemmings so will make arrangements for RN to assist with home wound VAC changes 3x per week at discharge - Will arrange this on Thursday 02/28/22 with Dr. Stanford Breed. This will be outpatient surgery. Discussed procedure with patient and his daughter on telephone   Karoline Caldwell, [place] [facility] [phone]  Clinic MD:  Roxanne Mins

## 2022-02-25 NOTE — Telephone Encounter (Signed)
Patient's daughter Maximiano Coss called stating her dad's wound appears to be getting worst. She did send a pic via Mychart that I reviewed with our in house PA [facility]). She stated the wound looks like it's healing nicely, from the pictures it doesn't seem to be infected but for reassurance to scheule the pt for an appt to have the wound looked at. I advised Maximiano Coss that we received the photos and the wound looks to be healing but for reassurance I will schedule pt for an evaluation of the site. She states the pt denies, pain, fever,chills and/or body aches. She did report the wound having a odor. Pt scheduled on our PA schedule. Sharisse voiced her understanding.

## 2022-02-26 ENCOUNTER — Ambulatory Visit (INDEPENDENT_AMBULATORY_CARE_PROVIDER_SITE_OTHER): Payer: Medicare PPO | Admitting: Physician Assistant

## 2022-02-26 ENCOUNTER — Encounter: Payer: Self-pay | Admitting: Physician Assistant

## 2022-02-26 ENCOUNTER — Other Ambulatory Visit: Payer: Self-pay

## 2022-02-26 DIAGNOSIS — I739 Peripheral vascular disease, unspecified: Secondary | ICD-10-CM

## 2022-02-26 DIAGNOSIS — T8131XA Disruption of external operation (surgical) wound, not elsewhere classified, initial encounter: Secondary | ICD-10-CM

## 2022-02-27 ENCOUNTER — Other Ambulatory Visit: Payer: Self-pay

## 2022-02-27 ENCOUNTER — Encounter (HOSPITAL_COMMUNITY): Payer: Self-pay | Admitting: Vascular Surgery

## 2022-02-27 NOTE — Progress Notes (Signed)
PCP - Dr. Edilia Bo  Cardiologist - Dr. Lindwood Qua  EP- Denies  Endocrine- Denies  Pulm- Denies  Chest x-ray - Denies  EKG - 12/28/21 (E)  Stress Test - Denies  ECHO - 09/06/20 (E)  Cardiac Cath - 2010 (E)  AICD-na PM-na LOOP-na  Nerve Stimulator- Denies  Dialysis- Denies  Sleep Study - Denies CPAP - Denies  LABS- 02/28/22: CBC, BMP  ASA- Cont.  ERAS- No  HA1C- 01/02/22(E): 8.6 Fasting Blood Sugar - 90-175 Checks Blood Sugar __2___ times a day  Anesthesia- Yes- elevated A1C- PA notified  Pt denies having chest pain, sob, or fever during the pre-op phone call. All instructions explained to the pt, with a verbal understanding of the material including: as of today, stop taking all Aspirin (unless instructed by your doctor) and Other Aspirin containing products, Vitamins, Fish oils, and Herbal medications. Also stop all NSAIDS i.e. Advil, Ibuprofen, Motrin, Aleve, Anaprox, Naproxen, BC, Goody Powders, and all Supplements.    WHAT DO I DO ABOUT MY DIABETES MEDICATION?  Do not take Metformin the morning of surgery.    THE MORNING OF SURGERY, take _____27________ units of _____Lantus_____insulin.  If your CBG is greater than 220 mg/dL, you may take 7.5 units of Humalog insulin.  How to Manage Your Diabetes Before and After Surgery  How do I manage my blood sugar before surgery? Check your blood sugar at least 4 times a day, starting 2 days before surgery, to make sure that the level is not too high or low. Check your blood sugar the morning of your surgery when you wake up and every 2 hours until you get to the Short Stay unit. If your blood sugar is less than 70 mg/dL, you will need to treat for low blood sugar: Do not take insulin. Treat a low blood sugar (less than 70 mg/dL) with  cup of clear juice (cranberry or apple), 4 glucose tablets, OR glucose gel. Recheck blood sugar in 15 minutes after treatment (to make sure it is greater than 70 mg/dL). If your  blood sugar is not greater than 70 mg/dL on recheck, call [phone]  for further instructions. Report your blood sugar to the short stay nurse when you get to Short Stay.  Reviewed and Endorsed by [facility] Patient Education Committee, August 2015  Pt also instructed to wear a mask and social distance if he goes out. The opportunity to ask questions was provided.

## 2022-02-27 NOTE — Anesthesia Preprocedure Evaluation (Addendum)
Anesthesia Evaluation  Patient identified by MRN, date of birth, ID band Patient awake    Reviewed: Allergy & Precautions, NPO status , Patient's Chart, lab work & pertinent test results  History of Anesthesia Complications Negative for: history of anesthetic complications  Airway Mallampati: III  TM Distance: >3 FB Neck ROM: Full    Dental  (+) Dental Advisory Given   Pulmonary Current Smoker and Patient abstained from smoking.,    breath sounds clear to auscultation       Cardiovascular hypertension, Pt. on home beta blockers and Pt. on medications (-) angina+ CAD, + Past MI, + Cardiac Stents and + Peripheral Vascular Disease   Rhythm:Regular  1. Left ventricular ejection fraction, by estimation, is 50 to 55%. The  left ventricle has low normal function. The left ventricle has no regional  wall motion abnormalities. There is mild concentric left ventricular  hypertrophy. Indeterminate diastolic  filling due to E-A fusion. The average left ventricular global  longitudinal strain is -19.6 %. The global longitudinal strain is normal.  2. Right ventricular systolic function is normal. The right ventricular  size is normal. Tricuspid regurgitation signal is inadequate for assessing  PA pressure.  3. Left atrial size was mildly dilated.  4. The mitral valve is normal in structure. No evidence of mitral valve  regurgitation. No evidence of mitral stenosis.  5. The aortic valve is normal in structure. Aortic valve regurgitation is  not visualized. No aortic stenosis is present.  6. The inferior vena cava is normal in size with greater than 50%  respiratory variability, suggesting right atrial pressure of 3 mmHg.    Neuro/Psych 2018 cva no residual TIA Neuromuscular disease CVA, No Residual Symptoms negative psych ROS   GI/Hepatic Neg liver ROS, GERD  Medicated and Controlled,  Endo/Other  diabetes, Insulin DependentLab  Results      Component                Value               Date                      HGBA1C                   8.6 (H)             01/02/2022             Renal/GU negative Renal ROSLab Results      Component                Value               Date                      CREATININE               0.90                02/28/2022                Musculoskeletal   Abdominal   Peds  Hematology  (+) Blood dyscrasia, anemia , Lab Results      Component                Value               Date  WBC                      14.8 (H)            01/15/2022                HGB                      11.6 (L)            02/28/2022                HCT                      34.0 (L)            02/28/2022                MCV                      87.7                01/15/2022                PLT                      814 (H)             01/15/2022              Anesthesia Other Findings   Reproductive/Obstetrics                           Anesthesia Physical Anesthesia Plan  ASA: 2  Anesthesia Plan: MAC   Post-op Pain Management: Minimal or no pain anticipated   Induction: Intravenous  PONV Risk Score and Plan: 2 and Propofol infusion and Treatment may vary due to age or medical condition  Airway Management Planned: Nasal Cannula  Additional Equipment: None  Intra-op Plan:   Post-operative Plan:   Informed Consent:     Dental advisory given  Plan Discussed with: CRNA  Anesthesia Plan Comments: (PAT note by Karoline Caldwell, PA-C: Follows with cardiologist Dr. Johney Frame for history of HTN, CAD s/p possible PCI in 2010. Patient reported that in 2010 when he was living in [place], he was seen at the ED for chest discomfort and ultimately underwent PCI x1. Details are unclear. He was on Plavix for some time but it was ultimately discontinued after recurrent diverticular bleeding. He has denied recurrent anginal symptoms. TTE 09/2020 showed EF 50 to 55%, no  regional WMA, no significant valvular disease. Patient was last seen by Dr. Johney Frame 03/12/2021 and at that time reportedly feeling "great". He was noted to be active without exertional symptoms. No changes made to medical management. 49-monthfollow-up was recommended at that time.  Patient recently underwent right femoral to peroneal bypass on 01/02/2022 and now has dehiscence of right distal saphenectomy site and requires I&D with application of wound VAC.  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  Patient will need day of surgery labs and evaluation.  EKG 12/28/2021: NSR. Rate 81. Cannot rule out anteroseptal wall MI, age undetermined.  TTE 09/06/2020: 1. Left ventricular ejection fraction, by estimation, is 50 to 55%. The  left ventricle has low normal function. The left ventricle has no regional  wall motion abnormalities. There is mild concentric left ventricular  hypertrophy. Indeterminate diastolic  filling due to  E-A fusion. The average left ventricular global  longitudinal strain is -19.6 %. The global longitudinal strain is normal.  2. Right ventricular systolic function is normal. The right ventricular  size is normal. Tricuspid regurgitation signal is inadequate for assessing  PA pressure.  3. Left atrial size was mildly dilated.  4. The mitral valve is normal in structure. No evidence of mitral valve  regurgitation. No evidence of mitral stenosis.  5. The aortic valve is normal in structure. Aortic valve regurgitation is  not visualized. No aortic stenosis is present.  6. The inferior vena cava is normal in size with greater than 50%  respiratory variability, suggesting right atrial pressure of 3 mmHg.   )       Anesthesia Quick Evaluation

## 2022-02-27 NOTE — Progress Notes (Signed)
Anesthesia Chart Review: Same-day work-up  Follows with cardiologist Dr. Shari Prows for history of HTN, CAD s/p possible PCI in 2010.  Patient reported that in 2010 when he was living in [place], he was seen at the ED for chest discomfort and ultimately underwent PCI x1.  Details are unclear.  He was on Plavix for some time but it was ultimately discontinued after recurrent diverticular bleeding.  He has denied recurrent anginal symptoms.  TTE 09/2020 showed EF 50 to 55%, no regional WMA, no significant valvular disease.  Patient was last seen by Dr. Shari Prows 03/12/2021 and at that time reportedly feeling "great".  He was noted to be active without exertional symptoms.  No changes made to medical management.  17-month follow-up was recommended at that time.   Patient recently underwent right femoral to peroneal bypass on 01/02/2022 and now has dehiscence of right distal saphenectomy site and requires I&D with application of wound VAC.  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   Patient will need day of surgery labs and evaluation.   EKG 12/28/2021: NSR.  Rate 81.  Cannot rule out anteroseptal wall MI, age undetermined.   TTE 09/06/2020:  1. Left ventricular ejection fraction, by estimation, is 50 to 55%. The  left ventricle has low normal function. The left ventricle has no regional  wall motion abnormalities. There is mild concentric left ventricular  hypertrophy. Indeterminate diastolic  filling due to E-A fusion. The average left ventricular global  longitudinal strain is -19.6 %. The global longitudinal strain is normal.   2. Right ventricular systolic function is normal. The right ventricular  size is normal. Tricuspid regurgitation signal is inadequate for assessing  PA pressure.   3. Left atrial size was mildly dilated.   4. The mitral valve is normal in structure. No evidence of mitral valve  regurgitation. No evidence of mitral stenosis.   5. The aortic valve is normal in structure. Aortic valve  regurgitation is  not visualized. No aortic stenosis is present.   6. The inferior vena cava is normal in size with greater than 50%  respiratory variability, suggesting right atrial pressure of 3 mmHg.      Zannie Cove [facility] Short Stay Center/Anesthesiology Phone [phone] 02/27/2022 3:56 PM

## 2022-02-28 ENCOUNTER — Ambulatory Visit (HOSPITAL_COMMUNITY)
Admission: RE | Admit: 2022-02-28 | Discharge: 2022-02-28 | Disposition: A | Discharge status: Home / Self Care | Payer: Medicare PPO | Attending: Vascular Surgery | Admitting: Vascular Surgery

## 2022-02-28 ENCOUNTER — Encounter (HOSPITAL_COMMUNITY)
Admission: RE | Disposition: A | Discharge status: Home / Self Care | Payer: Self-pay | Source: Home / Self Care | Attending: Vascular Surgery

## 2022-02-28 ENCOUNTER — Ambulatory Visit (HOSPITAL_COMMUNITY): Payer: Medicare PPO | Admitting: Physician Assistant

## 2022-02-28 ENCOUNTER — Ambulatory Visit (HOSPITAL_BASED_OUTPATIENT_CLINIC_OR_DEPARTMENT_OTHER): Payer: Medicare PPO | Admitting: Physician Assistant

## 2022-02-28 DIAGNOSIS — Z955 Presence of coronary angioplasty implant and graft: Secondary | ICD-10-CM | POA: Diagnosis not present

## 2022-02-28 DIAGNOSIS — Z8673 Personal history of transient ischemic attack (TIA), and cerebral infarction without residual deficits: Secondary | ICD-10-CM | POA: Insufficient documentation

## 2022-02-28 DIAGNOSIS — I739 Peripheral vascular disease, unspecified: Secondary | ICD-10-CM | POA: Diagnosis not present

## 2022-02-28 DIAGNOSIS — K219 Gastro-esophageal reflux disease without esophagitis: Secondary | ICD-10-CM | POA: Insufficient documentation

## 2022-02-28 DIAGNOSIS — Z794 Long term (current) use of insulin: Secondary | ICD-10-CM | POA: Insufficient documentation

## 2022-02-28 DIAGNOSIS — E1151 Type 2 diabetes mellitus with diabetic peripheral angiopathy without gangrene: Secondary | ICD-10-CM | POA: Diagnosis not present

## 2022-02-28 DIAGNOSIS — E119 Type 2 diabetes mellitus without complications: Secondary | ICD-10-CM | POA: Insufficient documentation

## 2022-02-28 DIAGNOSIS — I10 Essential (primary) hypertension: Secondary | ICD-10-CM | POA: Insufficient documentation

## 2022-02-28 DIAGNOSIS — Z95828 Presence of other vascular implants and grafts: Secondary | ICD-10-CM | POA: Insufficient documentation

## 2022-02-28 DIAGNOSIS — Y831 Surgical operation with implant of artificial internal device as the cause of abnormal reaction of the patient, or of later complication, without mention of misadventure at the time of the procedure: Secondary | ICD-10-CM | POA: Insufficient documentation

## 2022-02-28 DIAGNOSIS — F17210 Nicotine dependence, cigarettes, uncomplicated: Secondary | ICD-10-CM | POA: Diagnosis not present

## 2022-02-28 DIAGNOSIS — T8130XA Disruption of wound, unspecified, initial encounter: Secondary | ICD-10-CM | POA: Diagnosis not present

## 2022-02-28 DIAGNOSIS — T8131XA Disruption of external operation (surgical) wound, not elsewhere classified, initial encounter: Secondary | ICD-10-CM

## 2022-02-28 DIAGNOSIS — I252 Old myocardial infarction: Secondary | ICD-10-CM | POA: Diagnosis not present

## 2022-02-28 DIAGNOSIS — T827XXA Infection and inflammatory reaction due to other cardiac and vascular devices, implants and grafts, initial encounter: Secondary | ICD-10-CM | POA: Insufficient documentation

## 2022-02-28 DIAGNOSIS — I9789 Other postprocedural complications and disorders of the circulatory system, not elsewhere classified: Secondary | ICD-10-CM | POA: Diagnosis not present

## 2022-02-28 DIAGNOSIS — I2510 Atherosclerotic heart disease of native coronary artery without angina pectoris: Secondary | ICD-10-CM | POA: Insufficient documentation

## 2022-02-28 HISTORY — PX: INCISION AND DRAINAGE OF WOUND: SHX1803

## 2022-02-28 HISTORY — PX: APPLICATION OF WOUND VAC: SHX5189

## 2022-02-28 LAB — POCT I-STAT, CHEM 8
BUN: 18 mg/dL (ref 8–23)
Calcium, Ion: 1.05 mmol/L — ABNORMAL LOW (ref 1.15–1.40)
Chloride: 109 mmol/L (ref 98–111)
Creatinine, Ser: 0.9 mg/dL (ref 0.61–1.24)
Glucose, Bld: 73 mg/dL (ref 70–99)
HCT: 34 % — ABNORMAL LOW (ref 39.0–52.0)
Hemoglobin: 11.6 g/dL — ABNORMAL LOW (ref 13.0–17.0)
Potassium: 4.8 mmol/L (ref 3.5–5.1)
Sodium: 140 mmol/L (ref 135–145)
TCO2: 23 mmol/L (ref 22–32)

## 2022-02-28 LAB — GLUCOSE, CAPILLARY
Glucose-Capillary: 89 mg/dL (ref 70–99)
Glucose-Capillary: 98 mg/dL (ref 70–99)

## 2022-02-28 SURGERY — IRRIGATION AND DEBRIDEMENT WOUND
Anesthesia: Monitor Anesthesia Care | Site: Leg Lower | Laterality: Right

## 2022-02-28 MED ORDER — 0.9 % SODIUM CHLORIDE (POUR BTL) OPTIME
TOPICAL | Status: DC | PRN
  Administered 2022-02-28: 1000 mL

## 2022-02-28 MED ORDER — LIDOCAINE 2% (20 MG/ML) 5 ML SYRINGE
INTRAMUSCULAR | Status: DC | PRN
  Administered 2022-02-28: 30 mg via INTRAVENOUS

## 2022-02-28 MED ORDER — ESMOLOL HCL 100 MG/10ML IV SOLN
INTRAVENOUS | Status: DC | PRN
  Administered 2022-02-28: 30 mg via INTRAVENOUS

## 2022-02-28 MED ORDER — INSULIN ASPART 100 UNIT/ML IJ SOLN: 0.0000 [IU] | INTRAMUSCULAR | Status: DC | PRN

## 2022-02-28 MED ORDER — GLYCOPYRROLATE PF 0.2 MG/ML IJ SOSY
PREFILLED_SYRINGE | INTRAMUSCULAR | Status: DC | PRN
  Administered 2022-02-28: .2 mg via INTRAVENOUS

## 2022-02-28 MED ORDER — SODIUM CHLORIDE 0.9 % IV SOLN: INTRAVENOUS | Status: DC

## 2022-02-28 MED ORDER — ESMOLOL HCL 100 MG/10ML IV SOLN
INTRAVENOUS | Status: AC
  Filled 2022-02-28: qty 10

## 2022-02-28 MED ORDER — STERILE WATER FOR IRRIGATION IR SOLN
Status: DC | PRN
  Administered 2022-02-28: 1000 mL

## 2022-02-28 MED ORDER — PROPOFOL 500 MG/50ML IV EMUL
INTRAVENOUS | Status: DC | PRN
  Administered 2022-02-28: 45 ug/kg/min via INTRAVENOUS

## 2022-02-28 MED ORDER — CEFAZOLIN SODIUM-DEXTROSE 2-4 GM/100ML-% IV SOLN
2.0000 g | INTRAVENOUS | Status: AC
  Administered 2022-02-28: 2 g via INTRAVENOUS
  Filled 2022-02-28: qty 100

## 2022-02-28 MED ORDER — ORAL CARE MOUTH RINSE: 15.0000 mL | Freq: Once | OROMUCOSAL | Status: AC

## 2022-02-28 MED ORDER — ACETAMINOPHEN 160 MG/5ML PO SOLN: 1000.0000 mg | Freq: Once | ORAL | Status: DC | PRN

## 2022-02-28 MED ORDER — CHLORHEXIDINE GLUCONATE 4 % EX LIQD: 60.0000 mL | Freq: Once | CUTANEOUS | Status: DC

## 2022-02-28 MED ORDER — GLYCOPYRROLATE PF 0.2 MG/ML IJ SOSY
PREFILLED_SYRINGE | INTRAMUSCULAR | Status: AC
  Filled 2022-02-28: qty 1

## 2022-02-28 MED ORDER — FENTANYL CITRATE (PF) 250 MCG/5ML IJ SOLN
INTRAMUSCULAR | Status: DC | PRN
  Administered 2022-02-28: 50 ug via INTRAVENOUS
  Administered 2022-02-28: 100 ug via INTRAVENOUS
  Administered 2022-02-28 (×2): 50 ug via INTRAVENOUS

## 2022-02-28 MED ORDER — LIDOCAINE-EPINEPHRINE (PF) 1 %-1:200000 IJ SOLN
INTRAMUSCULAR | Status: DC | PRN
  Administered 2022-02-28: .1 mL

## 2022-02-28 MED ORDER — FENTANYL CITRATE (PF) 100 MCG/2ML IJ SOLN
INTRAMUSCULAR | Status: AC
  Filled 2022-02-28: qty 2

## 2022-02-28 MED ORDER — ACETAMINOPHEN 500 MG PO TABS: 1000.0000 mg | ORAL_TABLET | Freq: Once | ORAL | Status: DC | PRN

## 2022-02-28 MED ORDER — LIDOCAINE 2% (20 MG/ML) 5 ML SYRINGE
INTRAMUSCULAR | Status: AC
  Filled 2022-02-28: qty 5

## 2022-02-28 MED ORDER — FENTANYL CITRATE (PF) 250 MCG/5ML IJ SOLN
INTRAMUSCULAR | Status: AC
  Filled 2022-02-28: qty 5

## 2022-02-28 MED ORDER — ONDANSETRON HCL 4 MG/2ML IJ SOLN
INTRAMUSCULAR | Status: DC | PRN
  Administered 2022-02-28: 4 mg via INTRAVENOUS

## 2022-02-28 MED ORDER — PROPOFOL 10 MG/ML IV BOLUS
INTRAVENOUS | Status: DC | PRN
  Administered 2022-02-28 (×2): 20 mg via INTRAVENOUS

## 2022-02-28 MED ORDER — HYDROCODONE-ACETAMINOPHEN 5-325 MG PO TABS: 1.0000 | ORAL_TABLET | Freq: Four times a day (QID) | ORAL | 0 refills | Status: DC | PRN

## 2022-02-28 MED ORDER — CHLORHEXIDINE GLUCONATE 0.12 % MT SOLN
15.0000 mL | Freq: Once | OROMUCOSAL | Status: AC
  Administered 2022-02-28: 15 mL via OROMUCOSAL
  Filled 2022-02-28: qty 15

## 2022-02-28 MED ORDER — LACTATED RINGERS IV SOLN: INTRAVENOUS | Status: DC

## 2022-02-28 MED ORDER — LIDOCAINE-EPINEPHRINE (PF) 1 %-1:200000 IJ SOLN
INTRAMUSCULAR | Status: AC
  Filled 2022-02-28: qty 30

## 2022-02-28 MED ORDER — ACETAMINOPHEN 10 MG/ML IV SOLN
1000.0000 mg | Freq: Once | INTRAVENOUS | Status: DC | PRN
  Administered 2022-02-28: 1000 mg via INTRAVENOUS

## 2022-02-28 MED ORDER — ACETAMINOPHEN 10 MG/ML IV SOLN
INTRAVENOUS | Status: AC
  Filled 2022-02-28: qty 100

## 2022-02-28 MED ORDER — PHENYLEPHRINE 80 MCG/ML (10ML) SYRINGE FOR IV PUSH (FOR BLOOD PRESSURE SUPPORT)
PREFILLED_SYRINGE | INTRAVENOUS | Status: DC | PRN
  Administered 2022-02-28: 80 ug via INTRAVENOUS

## 2022-02-28 MED ORDER — FENTANYL CITRATE (PF) 100 MCG/2ML IJ SOLN
25.0000 ug | INTRAMUSCULAR | Status: DC | PRN
  Administered 2022-02-28 (×3): 50 ug via INTRAVENOUS

## 2022-02-28 SURGICAL SUPPLY — 41 items
BAG COUNTER SPONGE SURGICOUNT (BAG) ×2 IMPLANT
BNDG COHESIVE 4X5 TAN ST LF (GAUZE/BANDAGES/DRESSINGS) ×1 IMPLANT
BNDG ELASTIC 4X5.8 VLCR STR LF (GAUZE/BANDAGES/DRESSINGS) IMPLANT
BNDG ELASTIC 6X5.8 VLCR STR LF (GAUZE/BANDAGES/DRESSINGS) IMPLANT
BNDG GAUZE ELAST 4 BULKY (GAUZE/BANDAGES/DRESSINGS) ×1 IMPLANT
CANISTER SUCT 3000ML PPV (MISCELLANEOUS) ×2 IMPLANT
CANISTER WOUND CARE 500ML ATS (WOUND CARE) ×1 IMPLANT
CLIP LIGATING EXTRA MED SLVR (CLIP) ×1 IMPLANT
CLIP LIGATING EXTRA SM BLUE (MISCELLANEOUS) ×1 IMPLANT
COVER SURGICAL LIGHT HANDLE (MISCELLANEOUS) ×2 IMPLANT
DRAPE DERMATAC (DRAPES) ×1 IMPLANT
DRAPE EXTREMITY T 121X128X90 (DISPOSABLE) IMPLANT
DRAPE HALF SHEET 40X57 (DRAPES) ×1 IMPLANT
DRAPE INCISE IOBAN 66X45 STRL (DRAPES) IMPLANT
DRAPE ORTHO SPLIT 77X108 STRL (DRAPES)
DRAPE SURG ORHT 6 SPLT 77X108 (DRAPES) IMPLANT
DRSG ADAPTIC 3X8 NADH LF (GAUZE/BANDAGES/DRESSINGS) IMPLANT
DRSG VAC ATS MED SENSATRAC (GAUZE/BANDAGES/DRESSINGS) ×1 IMPLANT
ELECT REM PT RETURN 9FT ADLT (ELECTROSURGICAL) ×2
ELECTRODE REM PT RTRN 9FT ADLT (ELECTROSURGICAL) ×1 IMPLANT
GAUZE SPONGE 4X4 12PLY STRL LF (GAUZE/BANDAGES/DRESSINGS) ×2 IMPLANT
GLOVE BIO SURGEON STRL SZ7.5 (GLOVE) ×2 IMPLANT
GOWN STRL REUS W/ TWL LRG LVL3 (GOWN DISPOSABLE) ×2 IMPLANT
GOWN STRL REUS W/ TWL XL LVL3 (GOWN DISPOSABLE) ×1 IMPLANT
GOWN STRL REUS W/TWL LRG LVL3 (GOWN DISPOSABLE) ×2
GOWN STRL REUS W/TWL XL LVL3 (GOWN DISPOSABLE) ×1
KIT BASIN OR (CUSTOM PROCEDURE TRAY) ×2 IMPLANT
KIT TURNOVER KIT B (KITS) ×2 IMPLANT
NS IRRIG 1000ML POUR BTL (IV SOLUTION) ×2 IMPLANT
PACK GENERAL/GYN (CUSTOM PROCEDURE TRAY) IMPLANT
PAD ARMBOARD 7.5X6 YLW CONV (MISCELLANEOUS) ×4 IMPLANT
SUT ETHILON 3 0 PS 1 (SUTURE) IMPLANT
SUT VIC AB 2-0 CT1 27 (SUTURE)
SUT VIC AB 2-0 CT1 TAPERPNT 27 (SUTURE) IMPLANT
SUT VIC AB 3-0 SH 27 (SUTURE)
SUT VIC AB 3-0 SH 27X BRD (SUTURE) IMPLANT
SUT VICRYL 4-0 PS2 18IN ABS (SUTURE) IMPLANT
SYR CONTROL 10ML LL (SYRINGE) ×1 IMPLANT
TOWEL GREEN STERILE (TOWEL DISPOSABLE) ×4 IMPLANT
TOWEL GREEN STERILE FF (TOWEL DISPOSABLE) ×2 IMPLANT
WATER STERILE IRR 1000ML POUR (IV SOLUTION) ×2 IMPLANT

## 2022-02-28 NOTE — Interval H&P Note (Signed)
History and Physical Interval Note:  02/28/2022 9:45 AM  Erik Rose  has presented today for surgery, with the diagnosis of Postoperative wound dehiscence.  The various methods of treatment have been discussed with the patient and family. After consideration of risks, benefits and other options for treatment, the patient has consented to  Procedure(s): IRRIGATION AND DEBRIDEMENT RIGHT LEG WOUND (Right) APPLICATION OF WOUND VAC (Right) as a surgical intervention.  The patient's history has been reviewed, patient examined, no change in status, stable for surgery.  I have reviewed the patient's chart and labs.  Questions were answered to the patient's satisfaction.     [name]

## 2022-02-28 NOTE — Op Note (Signed)
DATE OF SERVICE: 02/28/2022  PATIENT:  Erik Rose  66 y.o. male  PRE-OPERATIVE DIAGNOSIS:  right calf wound dehiscence after femoral peroneal bypass  POST-OPERATIVE DIAGNOSIS:  Same  PROCEDURE:   1) Mechanical debridement of right calf 2) Application of negative pressure therapy (11 x 3 x 0.5cm)  SURGEON:  Surgeon(s) and Role:    * Cherre Robins, MD - Primary  ASSISTANT: none  ANESTHESIA:   MAC  EBL: minimal  BLOOD ADMINISTERED:none  DRAINS: none   LOCAL MEDICATIONS USED:  NONE  SPECIMEN:  none  COUNTS: confirmed correct.  TOURNIQUET:  none  PATIENT DISPOSITION:  PACU - hemodynamically stable.   Delay start of Pharmacological VTE agent (>24hrs) due to surgical blood loss or risk of bleeding: no  INDICATION FOR PROCEDURE: Erik Rose is a 66 y.o. male with right calf wound after femoral - peroneal bypass. The wound has been difficult to heal with simple dressing changes. After careful discussion of risks, benefits, and alternatives the patient was offered initiation of wound vac therapy. The patient understood and wished to proceed.  OPERATIVE FINDINGS: Fibrinous exudate debrided with curette. VAC applied with good seal. Start home VAC changes Friday.  DESCRIPTION OF PROCEDURE: After identification of the patient in the pre-operative holding area, the patient was transferred to the operating room. The patient was positioned supine on the operating room table. Anesthesia was induced. The right calf was prepped and draped in standard fashion. A surgical pause was performed confirming correct patient, procedure, and operative location.  The right calf wound was debrided with a curette.  Healthy bleeding tissue was noted.  The wound was gently cleansed with saline.  11 x 3 x 0.5 cm wound was noted in the calf.  A medium black sponge was brought in the field and cut to fit into the wound.  Derma tack was placed over the skin.  The VAC was a sealed with  occlusive dressing.  Kerlix and Coban were applied to the leg.  Upon completion of the case instrument and sharps counts were confirmed correct. The patient was transferred to the PACU in good condition. I was present for all portions of the procedure.  Yevonne Aline. Stanford Breed, MD [facility] Phone Number: [phone] 02/28/2022 10:52 AM

## 2022-02-28 NOTE — Transfer of Care (Signed)
Immediate Anesthesia Transfer of Care Note  Patient: Kyi Romanello Musil  Procedure(s) Performed: IRRIGATION AND DEBRIDEMENT RIGHT LEG WOUND (Right: Leg Lower) APPLICATION OF WOUND VAC (Right: Leg Lower)  Patient Location: PACU  Anesthesia Type:MAC  Level of Consciousness: awake, drowsy, patient cooperative and responds to stimulation  Airway & Oxygen Therapy: Patient Spontanous Breathing and Patient connected to nasal cannula oxygen  Post-op Assessment: Report given to RN and Post -op Vital signs reviewed and stable  Post vital signs: Reviewed and stable  Last Vitals:  Vitals Value Taken Time  BP 154/75 02/28/22 1047  Temp    Pulse 90 02/28/22 1047  Resp 13 02/28/22 1047  SpO2 100 % 02/28/22 1047  Vitals shown include unvalidated device data.  Last Pain:  Vitals:   02/28/22 0948  TempSrc:   PainSc: 6       Patients Stated Pain Goal: 2 (01/00/71 2197)  Complications: No notable events documented.

## 2022-02-28 NOTE — TOC Initial Note (Signed)
Transition of Care (TOC) - Initial/Assessment Note  Donn Pierini RN, BSN Transitions of Care Unit 4E- RN Case Manager See Treatment Team for direct phone #    Patient Details  Name: Erik Rose MRN: [id number] Date of Birth: 20-Jun-1956  Transition of Care [facility]) CM/SW Contact:    Darrold Span, RN Phone Number: 02/28/2022, 9:50 AM  Clinical Narrative:                 Pre-op notified by C. Baglia PA w/ vascular regarding plan for I&D on 5/25 and need for home VAC and HHRN. Home [employer] wound VAC order form signed and faxed to French Ana w/ [employer] to start process for insurance approval. French Ana will follow for Op note w/ measurements tomorrow.  Per C. Baglia pt informed vascular office that he has aide services with [facility] and would like to use them as well for his skilled nursing needs for wound vac drsg changes.   CM reached out to [facility] with Frances Furbish to check and see if they had nursing availability for wound VAC needs. - Per Kandee Keen they will have nursing available to do a drsg change over the weekend for start of care. Referral has been accepted.   CM will f/u tomorrow after pt arrives for procedure to check on [employer] [facility] approval, and make sure home VAC is delivered to PACU.   Expected Discharge Plan: Home w Home Health Services Barriers to Discharge: No Barriers Identified   Patient Goals and CMS Choice   CMS Medicare.gov Compare Post Acute Care list provided to:: Patient Choice offered to / list presented to :  (notified by vascular that pt would like to use [facility])  Expected Discharge Plan and Services Expected Discharge Plan: Home w Home Health Services   Discharge Planning Services: CM Consult Post Acute Care Choice: Durable Medical Equipment, Home Health Living arrangements for the past 2 months: Apartment                 DME Arranged: Vac DME Agency: [employer] Date DME Agency Contacted: 02/27/22 Time DME Agency Contacted: 1430 Representative spoke with at DME Agency:  French Ana HH Arranged: RN HH Agency: [facility] Date [facility] Agency Contacted: 02/27/22 Time HH Agency Contacted: 1515 Representative spoke with at [facility] Agency: Kandee Keen  Prior Living Arrangements/Services Living arrangements for the past 2 months: Apartment Lives with:: Spouse Patient language and need for interpreter reviewed:: Yes        Need for Family Participation in Patient Care: Yes (Comment) Care giver support system in place?: Yes (comment)   Criminal Activity/Legal Involvement Pertinent to Current Situation/Hospitalization: No - Comment as needed  Activities of Daily Living      Permission Sought/Granted                  Emotional Assessment         Alcohol / Substance Use: Not Applicable Psych Involvement: No (comment)  Admission diagnosis:  Postoperative wound dehiscence Patient Active Problem List   Diagnosis Date Noted   PAD (peripheral artery disease) (HCC) 01/02/2022   Intermittent claudication (HCC) 10/31/2021   COVID-19 10/03/2021   Left knee pain 08/15/2021   Lumbar spinal stenosis 03/15/2021   Pancreatitis 12/10/2020   Cervical radiculopathy 04/18/2020   Smoking greater than 20 pack years 04/18/2020   Eczema 01/18/2020   Colon cancer screening 12/09/2018   Essential hypertension 09/09/2018   Type 2 diabetes mellitus with hyperglycemia, with long-term current use of insulin (HCC) 09/09/2018  CAD (coronary artery disease), native coronary artery 09/09/2018   History of CVA (cerebrovascular accident) 09/09/2018   Mixed hyperlipidemia 02/05/2018   PCP:  [name], DO Pharmacy:   [facility], [address] [place] [address] Phone: [phone] Fax: [phone]  [facility] [place], [address] [place] [address] [place] [address] Phone: [phone] Fax:  [phone]     Social Determinants of Health (SDOH) Interventions    Readmission Risk Interventions    01/04/2022    3:14 PM  Readmission Risk Prevention Plan  Post Dischage Appt Complete  Medication Screening Complete  Transportation Screening Complete

## 2022-02-28 NOTE — Progress Notes (Signed)
RNCM spoke with French Ana with [employer] regarding wound vac. Per French Ana the patient was approved and Silva Bandy is working to get DME to patient to take home today.    Frances Furbish is follow patient for [facility] services.

## 2022-02-28 NOTE — TOC Transition Note (Addendum)
Transition of Care (TOC) - CM/SW Discharge Note Marvetta Gibbons RN, BSN Transitions of Care Unit 4E- RN Case Manager See Treatment Team for direct phone #    Patient Details  Name: Erik Rose MRN: [id number] Date of Birth: Jan 28, 1956  Transition of Care [facility]) CM/SW Contact:  Dawayne Patricia, RN Phone Number: 02/28/2022, 11:53 AM   Clinical Narrative:    Have been notified by [facility] PA w/ vascular that pt is in OR this am for I&D, MD will place measurements post OR in OP note. HHRN orders to be placed for home wound VAC drsg needs.  Have spoken with Olivia Mackie at [facility] this morning, home wound VAC is pending approval.   1115- have received word from [facility] that pt has been approved for Home VAC- Olivia Mackie will email POD to this CM for delivery of home VAC to PACU. Once POD received CM will go pick up Ready VAC and deliver to PACU. Have received msg from PACU that pt is now in [place] has been delivered to pt in PACU-[place], RN at the bedside. Signed POD has been faxed back to [facility]. Copy of POD left with RN to place with chart paperwork. Pt provided with originals.  Cory with [facility] notified that pt will d/c same day from PACU s/p I&D for start of care this weekend with first home drsg change of wound VAC. Pt informed that [place] has been set up with [facility] as per request. Pt voiced understanding.    Final next level of care: Home w Home Health Services Barriers to Discharge: No Barriers Identified   Patient Goals and CMS Choice   CMS Medicare.gov Compare Post Acute Care list provided to:: Patient Choice offered to / list presented to :  (notified by vascular that pt would like to use [facility])  Discharge Placement                 Home w/ [facility]      Discharge Plan and Services   Discharge Planning Services: CM Consult Post Acute Care Choice: Durable Medical Equipment, Home Health          DME Arranged: Vac DME Agency: [employer] Date DME Agency Contacted:  02/27/22 Time DME Agency Contacted: 1430 Representative spoke with at DME Agency: Olivia Mackie HH Arranged: RN [place] Agency: [place] Date [place]: 02/27/22 Time [place]: [id number] Representative spoke with at [place]: [place] Interventions     Readmission Risk Interventions    01/04/2022    3:14 PM  Readmission Risk Prevention Plan  Post Dischage Appt Complete  Medication Screening Complete  Transportation Screening Complete

## 2022-03-01 ENCOUNTER — Encounter (HOSPITAL_COMMUNITY): Payer: Self-pay | Admitting: Vascular Surgery

## 2022-03-01 NOTE — Anesthesia Postprocedure Evaluation (Signed)
Anesthesia Post Note  Patient: Erik Rose  Procedure(s) Performed: IRRIGATION AND DEBRIDEMENT RIGHT LEG WOUND (Right: Leg Lower) APPLICATION OF WOUND VAC (Right: Leg Lower)     Patient location during evaluation: PACU Anesthesia Type: MAC Level of consciousness: awake and alert Pain management: pain level controlled Vital Signs Assessment: post-procedure vital signs reviewed and stable Respiratory status: spontaneous breathing, nonlabored ventilation and respiratory function stable Cardiovascular status: stable and blood pressure returned to baseline Postop Assessment: no apparent nausea or vomiting Anesthetic complications: no   No notable events documented.  Last Vitals:  Vitals:   02/28/22 1145 02/28/22 1200  BP: 115/85 136/86  Pulse: 86 84  Resp: 11 14  Temp:  36.6 C  SpO2: 97% 99%    Last Pain:  Vitals:   02/28/22 1200  TempSrc:   PainSc: 5                  Loran Auguste
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
## 2022-03-18 ENCOUNTER — Encounter: Payer: Self-pay | Admitting: Cardiology

## 2022-03-18 ENCOUNTER — Ambulatory Visit: Payer: Medicare PPO | Admitting: Cardiology

## 2022-03-18 VITALS — BP 128/78 | HR 99 | Ht 70.0 in | Wt 216.2 lb

## 2022-03-18 DIAGNOSIS — I739 Peripheral vascular disease, unspecified: Secondary | ICD-10-CM

## 2022-03-18 DIAGNOSIS — E782 Mixed hyperlipidemia: Secondary | ICD-10-CM

## 2022-03-18 DIAGNOSIS — I10 Essential (primary) hypertension: Secondary | ICD-10-CM | POA: Diagnosis not present

## 2022-03-18 DIAGNOSIS — Z794 Long term (current) use of insulin: Secondary | ICD-10-CM

## 2022-03-18 DIAGNOSIS — E1165 Type 2 diabetes mellitus with hyperglycemia: Secondary | ICD-10-CM | POA: Diagnosis not present

## 2022-03-18 DIAGNOSIS — E7800 Pure hypercholesterolemia, unspecified: Secondary | ICD-10-CM

## 2022-03-18 DIAGNOSIS — I2510 Atherosclerotic heart disease of native coronary artery without angina pectoris: Secondary | ICD-10-CM

## 2022-03-18 NOTE — Patient Instructions (Signed)
Medication Instructions:   Your physician recommends that you continue on your current medications as directed. Please refer to the Current Medication list given to you today.  *If you need a refill on your cardiac medications before your next appointment, please call your pharmacy*   Follow-Up: At [facility], you and your health needs are our priority.  As part of our continuing mission to provide you with exceptional heart care, we have created designated Provider Care Teams.  These Care Teams include your primary Cardiologist (physician) and Advanced Practice Providers (APPs -  Physician Assistants and Nurse Practitioners) who all work together to provide you with the care you need, when you need it.  We recommend signing up for the patient portal called "MyChart".  Sign up information is provided on this After Visit Summary.  MyChart is used to connect with patients for Virtual Visits (Telemedicine).  Patients are able to view lab/test results, encounter notes, upcoming appointments, etc.  Non-urgent messages can be sent to your provider as well.   To learn more about what you can do with MyChart, go to [email or web address].    Your next appointment:   6 month(s)  The format for your next appointment:   In Person  Provider:   Heather E Pemberton, MD {   Important Information About Sugar       
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
## 2022-03-25 NOTE — Progress Notes (Unsigned)
POST OPERATIVE OFFICE NOTE    CC:  F/u for surgery  HPI:  This is a 66 y.o. male who is s/p 1) right common femoral to peroneal bypass with in-situ greater saphenous vein 2) right lower extremity angiogram by Dr. Stanford Breed on 01/02/22. He has had a right calf wound is being followed. He was last seen by Dr. Stanford Breed on 5/16. He was instructed to continue wet to dry dressings. He has assistance with home health aide for dressing changes. Patients daughter called yesterday with concerns about appearance of wound so he presents for earlier follow up today. He explains that he feels wound is getting larger. With assistance of [place] aid and also his youngest daughter who lives locally he is able to do wound changes daily. No significant pain in leg. No drainage or bleeding. No malodor. He denies any fever or chills. He ambulates either with rolling walker or without assistance at home   No Known Allergies  Current Outpatient Medications  Medication Sig Dispense Refill   acetaminophen (TYLENOL) 500 MG tablet Take 1,000 mg by mouth every 6 (six) hours as needed (pain.).     AMBULATORY NON FORMULARY MEDICATION Bedside commode 1 each 0   AMBULATORY NON FORMULARY MEDICATION Please provide 3 in 1 bedside commode for patient.  Diagnosis:  Post operative state Z98.890, Gait instability R26.81 1 Device 0   amLODipine (NORVASC) 5 MG tablet Take 1 tablet (5 mg total) by mouth daily. 90 tablet 3   aspirin EC 81 MG tablet Take 1 tablet (81 mg total) by mouth daily. Swallow whole.     atorvastatin (LIPITOR) 80 MG tablet Take 1 tablet (80 mg total) by mouth daily. 90 tablet 3   Blood Glucose Monitoring Suppl (ONE TOUCH ULTRA 2) w/Device KIT Use to take blood sugar one to two times daily 1 each 0   carvedilol (COREG) 12.5 MG tablet TAKE 1 TABLET(12.5 MG) BY MOUTH TWICE DAILY WITH A MEAL 180 tablet 1   esomeprazole (NEXIUM) 20 MG capsule Take 20 mg by mouth every morning. OTC     gabapentin (NEURONTIN) 300 MG capsule One  tab PO qHS for a week, then BID for a week, then TID. May double weekly to a max of 3,682m/day 90 capsule 3   HYDROcodone-acetaminophen (NORCO) 5-325 MG tablet Take 1 tablet by mouth every 6 (six) hours as needed for moderate pain. (Patient not taking: Reported on 03/18/2022) 15 tablet 0   insulin glargine (LANTUS SOLOSTAR) 100 UNIT/ML Solostar Pen ADMINISTER 55 UNITS UNDER THE SKIN DAILY 51 mL 2   insulin lispro (HUMALOG KWIKPEN) 100 UNIT/ML KwikPen DIAL AND INJECT 15 UNITS UNDER THE SKIN 3 TIMES DAILY. 15 mL 3   lisinopril (ZESTRIL) 40 MG tablet Take 1 tablet (40 mg total) by mouth daily. 90 tablet 1   loratadine (CLARITIN) 10 MG tablet Take 10 mg by mouth in the morning.     Magnesium 250 MG TABS Take 250 mg by mouth in the morning.     meloxicam (MOBIC) 15 MG tablet Take 1 tablet (15 mg total) by mouth daily as needed for pain. Take daily for 10 days and then as needed. 90 tablet 0   metFORMIN (GLUCOPHAGE XR) 500 MG 24 hr tablet Take 2 tablets (1,000 mg total) by mouth daily with breakfast. 180 tablet 1   Multiple Vitamins-Minerals (MULTIVITAMIN WITH MINERALS) tablet Take 1 tablet by mouth daily. Centrum 50+     naproxen sodium (ALEVE) 220 MG tablet Take 440 mg by mouth  2 (two) times daily as needed (pain.).     ONETOUCH ULTRA test strip USE AS DIRECTED TO TEST BLOOD SUGAR TWICE DAILY 100 strip 1   oxyCODONE-acetaminophen (PERCOCET/ROXICET) 5-325 MG tablet Take 1 tablet by mouth every 6 (six) hours as needed for moderate pain. (Patient not taking: Reported on 03/18/2022) 20 tablet 0   No current facility-administered medications for this visit.     ROS:  See HPI  Physical Exam:  There were no vitals filed for this visit.  General: well appearing, well nourished in no distress Incision: right leg saphenectomy site with dehiscence. Distal aspect of incision with fibrinous exudate. No purulence. Otherwise pink granulation tissue in wound bed. Other right leg incisions healed   Extremities:   RLE well perfused and warm Neuro: alert and oriented   Assessment/Plan:  This is a 66 y.o. male who is s/p:1) right common femoral to peroneal bypass with in-situ greater saphenous vein 2) right lower extremity angiogram by Dr. Stanford Breed on 01/02/22. Dehiscence of right distal saphenectomy site. Some increased separation of wound. To assist with wound healing recommend I& D in OR with application of wound VAC.  - Patient examined with Dr. Stanford Breed who recommends debridement in OR and VAC application - Patient already established with [facility] Lemmings so will make arrangements for RN to assist with home wound VAC changes 3x per week at discharge - Will arrange this on Thursday 02/28/22 with Dr. Stanford Breed. This will be outpatient surgery. Discussed procedure with patient and his daughter on telephone   Cherre Robins, [facility] [phone]  Clinic MD:  Roxanne Mins

## 2022-03-26 ENCOUNTER — Ambulatory Visit (INDEPENDENT_AMBULATORY_CARE_PROVIDER_SITE_OTHER): Payer: Medicare PPO | Admitting: Vascular Surgery

## 2022-03-26 ENCOUNTER — Encounter: Payer: Self-pay | Admitting: Vascular Surgery

## 2022-03-26 VITALS — BP 120/73 | HR 88 | Temp 99.2°F | Resp 20 | Ht 70.0 in | Wt 216.0 lb

## 2022-03-26 DIAGNOSIS — I739 Peripheral vascular disease, unspecified: Secondary | ICD-10-CM | POA: Diagnosis not present

## 2022-03-29 ENCOUNTER — Other Ambulatory Visit: Payer: Self-pay

## 2022-03-29 DIAGNOSIS — I739 Peripheral vascular disease, unspecified: Secondary | ICD-10-CM

## 2022-03-29 DIAGNOSIS — I70221 Atherosclerosis of native arteries of extremities with rest pain, right leg: Secondary | ICD-10-CM

## 2022-03-29 MED ORDER — LISINOPRIL 40 MG PO TABS: 40.0000 mg | ORAL_TABLET | Freq: Every day | ORAL | 3 refills | Status: DC

## 2022-04-01 DIAGNOSIS — G8929 Other chronic pain: Secondary | ICD-10-CM | POA: Diagnosis not present

## 2022-04-01 DIAGNOSIS — F17210 Nicotine dependence, cigarettes, uncomplicated: Secondary | ICD-10-CM | POA: Diagnosis not present

## 2022-04-01 DIAGNOSIS — E669 Obesity, unspecified: Secondary | ICD-10-CM | POA: Diagnosis not present

## 2022-04-01 DIAGNOSIS — I252 Old myocardial infarction: Secondary | ICD-10-CM | POA: Diagnosis not present

## 2022-04-01 DIAGNOSIS — I10 Essential (primary) hypertension: Secondary | ICD-10-CM | POA: Diagnosis not present

## 2022-04-01 DIAGNOSIS — Z794 Long term (current) use of insulin: Secondary | ICD-10-CM | POA: Diagnosis not present

## 2022-04-01 DIAGNOSIS — I2510 Atherosclerotic heart disease of native coronary artery without angina pectoris: Secondary | ICD-10-CM | POA: Diagnosis not present

## 2022-04-01 DIAGNOSIS — E785 Hyperlipidemia, unspecified: Secondary | ICD-10-CM | POA: Diagnosis not present

## 2022-04-01 DIAGNOSIS — E119 Type 2 diabetes mellitus without complications: Secondary | ICD-10-CM | POA: Diagnosis not present

## 2022-04-03 ENCOUNTER — Other Ambulatory Visit: Payer: Self-pay

## 2022-04-03 DIAGNOSIS — E1165 Type 2 diabetes mellitus with hyperglycemia: Secondary | ICD-10-CM

## 2022-04-03 MED ORDER — ACCU-CHEK AVIVA PLUS VI STRP: ORAL_STRIP | 12 refills | Status: DC

## 2022-04-03 MED ORDER — ACCU-CHEK AVIVA PLUS W/DEVICE KIT: PACK | 0 refills | Status: DC

## 2022-04-03 NOTE — Progress Notes (Signed)
Pt lvm stating insurance requesting change of glucometer. Accuchek covered with strips.   Also, pt states needing to change from Lantus to Toujeo.   PAP Lantus arrived today (04/03/22). Pt advised of pick-up. Changing to Toujeo after this refill has been finished.

## 2022-04-04 ENCOUNTER — Other Ambulatory Visit: Payer: Self-pay

## 2022-04-04 MED ORDER — MELOXICAM 15 MG PO TABS: 15.0000 mg | ORAL_TABLET | Freq: Every day | ORAL | 0 refills | Status: DC | PRN

## 2022-04-04 MED ORDER — ATORVASTATIN CALCIUM 80 MG PO TABS: 80.0000 mg | ORAL_TABLET | Freq: Every day | ORAL | 3 refills | Status: DC

## 2022-04-11 ENCOUNTER — Other Ambulatory Visit: Payer: Self-pay

## 2022-04-11 DIAGNOSIS — M48061 Spinal stenosis, lumbar region without neurogenic claudication: Secondary | ICD-10-CM

## 2022-04-11 MED ORDER — GABAPENTIN 300 MG PO CAPS: ORAL_CAPSULE | ORAL | 0 refills | Status: DC

## 2022-04-15 ENCOUNTER — Ambulatory Visit (INDEPENDENT_AMBULATORY_CARE_PROVIDER_SITE_OTHER): Payer: Medicare PPO | Admitting: Pharmacist

## 2022-04-15 DIAGNOSIS — I10 Essential (primary) hypertension: Secondary | ICD-10-CM

## 2022-04-15 DIAGNOSIS — E782 Mixed hyperlipidemia: Secondary | ICD-10-CM

## 2022-04-15 DIAGNOSIS — E1165 Type 2 diabetes mellitus with hyperglycemia: Secondary | ICD-10-CM

## 2022-04-15 NOTE — Progress Notes (Signed)
Chronic Care Management Pharmacy Note  04/16/2022 Name:  Erik Rose MRN:  [id number] DOB:  1956/02/08  Summary: addressed DM, HTN, HLD. Patient uses patient assistance programs for both lantus and humalog & is a great self-advocate of paperwork & ongoing needs. They are approved for 2023. However, there is mention of switching from lantus to toujeo.  BG checks at home: 120-130s fasting   He has been getting diabetes-conscious meals once every 2 weeks via [employer] program; gets them for 12 more weeks.  Recommendations/Changes made from today's visit: none, will provide toujeo paperwork for patient assistance via mychart message as patient requests. If he cannot print, we can provide a hard copy at the front desk.  Plan: f/u with pharmacist in 3-6 months  Subjective: Erik Rose is an 66 y.o. year old male who is a primary patient of [name], DO.  The CCM team was consulted for assistance with disease management and care coordination needs.    Engaged with patient by telephone for follow up visit in response to provider referral for pharmacy case management and/or care coordination services.   Consent to Services:  The patient was given information about Chronic Care Management services, agreed to services, and gave verbal consent prior to initiation of services.  Please see initial visit note for detailed documentation.   Patient Care Team: [name], DO as PCP - General (Family Medicine) Freada Bergeron, MD as PCP - Cardiology (Cardiology) [name], [facility] as Pharmacist (Pharmacist)  Recent office visits:  05/30/21-Thomas J. Dianah Field, MD. Lorenda Cahill for lumbar spinal stenosis. Start prednisone 50 mg for 5 days. Increase gabapentin 300 mg. Follow up in 4-6 weeks. 05/09/21-Cody Zigmund Daniel, DO (PCP) Seen for general follow up.  Increase Lantus to 35 units twice daily.  Continue Humalog 15 units 3 times daily.  Adding metformin XR back on at 1000 mg  daily. Follow up in 3 months. 03/15/21-Cody Zigmund Daniel, DO (PCP) Seen for hip pain. Start prednisone taper and tizanidine as needed. 02/05/21-Cody Zigmund Daniel, DO (PCP) General follow up. Start on amlodipine 5 mg. Increase lantus to 60 units split to 30 units BID. New neurosurgery referral entered. Follow up in 3 months.   Recent consult visits:  06/20/21-Karen K. Donawerth, PT (Rehabilitation) 06/15/21-Karen K. Donawerth, PT (Rehabilitation) 06/13/21-Jennifer L. [place] (Rehabilitation) 06/06/21-Karen K. Donawerth, PT (Rehabilitation) 06/01/21-Christina M. Kathlen Mody, PT (Rehabilitation) 03/12/21-Heather Renae Fickle, MD (Cardiology) Seen for general follow up. Stopped ozempic due to pancreatitis. Follow up in 8 months. 02/28/21-[facility]. Notes not available. 02/14/21-Neelesh Nundkumar (Neurosurgery) Notes not available.   Hospital visits:  None in previous 6 months  Objective:  Lab Results  Component Value Date   CREATININE 0.90 02/28/2022   CREATININE 0.96 01/15/2022   CREATININE 1.13 01/05/2022    Lab Results  Component Value Date   HGBA1C 8.6 (H) 01/02/2022      Component Value Date/Time   CHOL 97 01/03/2022 0500   CHOL 138 09/14/2020 1133   TRIG 66 01/03/2022 0500   HDL 38 (L) 01/03/2022 0500   HDL 53 09/14/2020 1133   CHOLHDL 2.6 01/03/2022 0500   VLDL 13 01/03/2022 0500   LDLCALC 46 01/03/2022 0500   LDLCALC 60 11/15/2021 0000   LDLDIRECT 112.0 09/09/2018 1513       Latest Ref Rng & Units 01/15/2022    3:57 PM 01/02/2022    8:12 AM 11/15/2021   12:00 AM  Hepatic Function  Total Protein 6.5 - 8.1 g/dL 7.1  6.7  6.9  Albumin 3.5 - 5.0 g/dL 3.3  3.5    AST 15 - 41 U/L '25  25  14   ' ALT 0 - 44 U/L 30  37  11   Alk Phosphatase 38 - 126 U/L 111  87    Total Bilirubin 0.3 - 1.2 mg/dL 0.5  0.6  0.4        Latest Ref Rng & Units 02/28/2022    9:15 AM 01/15/2022    3:57 PM 01/05/2022    2:00 AM  CBC  WBC 4.0 - 10.5 K/uL  14.8  19.6    Hemoglobin 13.0 - 17.0 g/dL 11.6  8.9  8.0   Hematocrit 39.0 - 52.0 % 34.0  27.7  22.7   Platelets 150 - 400 K/uL  814  213     No results found for: "VD25OH"  Clinical ASCVD: Yes  The ASCVD Risk score (Arnett DK, et al., 2019) failed to calculate for the following reasons:   The patient has a prior MI or stroke diagnosis    Social History   Tobacco Use  Smoking Status Every Day   Packs/day: 0.50   Years: 38.00   Total pack years: 19.00   Types: Cigarettes   Passive exposure: Never  Smokeless Tobacco Never  Tobacco Comments   5-8 cigarettes a day   BP Readings from Last 3 Encounters:  03/26/22 120/73  03/18/22 128/78  02/28/22 136/86   Pulse Readings from Last 3 Encounters:  03/26/22 88  03/18/22 99  02/28/22 84   Wt Readings from Last 3 Encounters:  03/26/22 216 lb (98 kg)  03/18/22 216 lb 3.2 oz (98.1 kg)  02/28/22 213 lb (96.6 kg)    Assessment: Review of patient past medical history, allergies, medications, health status, including review of consultants reports, laboratory and other test data, was performed as part of comprehensive evaluation and provision of chronic care management services.   SDOH:  (Social Determinants of Health) assessments and interventions performed:    CCM Care Plan  No Known Allergies  Medications Reviewed Today     Reviewed by [name], [facility] (Pharmacist) on 04/15/22 at 1031  Med List Status: <None>   Medication Order Taking? Sig Documenting Provider Last Dose Status Informant  acetaminophen (TYLENOL) 500 MG tablet [id number] No Take 1,000 mg by mouth every 6 (six) hours as needed (pain.).  Patient not taking: Reported on 04/15/2022   [provider] Not Taking Active Self  AMBULATORY NON [facility] MEDICATION [id number] Yes Bedside commode [name], DO Taking Active Self  AMBULATORY NON [place] [id number] Yes Please provide 3 in 1 bedside commode for patient.  Diagnosis:  Post operative state  Z98.890, Gait instability R26.81 [name], DO Taking Active Self  amLODipine (NORVASC) 5 MG tablet [id number] Yes Take 1 tablet (5 mg total) by mouth daily. [name], DO Taking Active Self  aspirin EC 81 MG tablet [id number] Yes Take 1 tablet (81 mg total) by mouth daily. Swallow whole. Freada Bergeron, MD Taking Active Self  atorvastatin (LIPITOR) 80 MG tablet [id number] Yes Take 1 tablet (80 mg total) by mouth daily. [name], DO Taking Active   Blood Glucose Monitoring Suppl (ACCU-CHEK AVIVA PLUS) w/Device KIT [id number] Yes USE DAILY TO MONITOR BLOOD GLUCOSE [name], DO Taking Active   carvedilol (COREG) 12.5 MG tablet [id number] Yes TAKE 1 TABLET(12.5 MG) BY MOUTH TWICE DAILY WITH A MEAL [name], DO Taking Active Self  esomeprazole (NEXIUM) 20 MG capsule [id number] Yes Take 20  mg by mouth every morning. OTC [provider] Taking Active Self  gabapentin (NEURONTIN) 300 MG capsule [id number] Yes One tab PO qHS for a week, then BID for a week, then TID. May double weekly to a max of 3,623m/day TSilverio Decamp MD Taking Active            Med Note (Rikki SpearingJul 10, 2023 10:26 AM) Taking as needed, as of 04/15/22 review  glucose blood (ACCU-CHEK AVIVA PLUS) test strip [id number] Use as instructed MLuetta Nutting DO Taking Active   insulin glargine (LANTUS SOLOSTAR) 100 UNIT/ML Solostar Pen [id number] ADMINISTER 55 UNITS UNDER THE SKIN DAILY MLuetta Nutting DO Taking Active Self  insulin lispro (HUMALOG KWIKPEN) 100 UNIT/ML KwikPen [id number] DIAL AND INJECT 15 UNITS UNDER THE SKIN 3 TIMES DAILY. MLuetta Nutting DO Taking Active Self  lisinopril (ZESTRIL) 40 MG tablet [id number] Take 1 tablet (40 mg total) by mouth daily. PFreada Bergeron MD Taking Active   loratadine (CLARITIN) 10 MG tablet [id number] Take 10 mg by mouth in the morning. [provider] Taking Active Self  Magnesium 250 MG TABS [id number] Take  250 mg by mouth in the morning. [provider] Taking Active Self  meloxicam (MOBIC) 15 MG tablet [id number] Take 1 tablet (15 mg total) by mouth daily as needed for pain. Take daily for 10 days and then as needed. MLuetta Nutting DO Taking Active   metFORMIN (GLUCOPHAGE XR) 500 MG 24 hr tablet [id number] Take 2 tablets (1,000 mg total) by mouth daily with breakfast. MLuetta Nutting DO Taking Active Self  Multiple Vitamins-Minerals (MULTIVITAMIN WITH MINERALS) tablet [id number] Take 1 tablet by mouth daily. Centrum 50+ [provider] Taking Active Self  naproxen sodium (ALEVE) 220 MG tablet [id number] Take 440 mg by mouth 2 (two) times daily as needed (pain.). [provider]  Active Self            Patient Active Problem List   Diagnosis Date Noted   PAD (peripheral artery disease) ([facility] 01/02/2022   Intermittent claudication ([place] 10/31/2021   COVID-19 10/03/2021   Left knee pain 08/15/2021   Lumbar spinal stenosis 03/15/2021   Pancreatitis 12/10/2020   Cervical radiculopathy 04/18/2020   Smoking greater than 20 pack years 04/18/2020   Eczema 01/18/2020   Colon cancer screening 12/09/2018   Essential hypertension 09/09/2018   Type 2 diabetes mellitus with hyperglycemia, with long-term current use of insulin ([facility] 09/09/2018   CAD (coronary artery disease), native coronary artery 09/09/2018   History of CVA (cerebrovascular accident) 09/09/2018   Mixed hyperlipidemia 02/05/2018    Immunization History  Administered Date(s) Administered   Influenza, Quadrivalent, Recombinant, Inj, Pf 07/17/2018   Influenza,inj,Quad PF,6+ Mos 06/10/2017, 06/22/2019, 07/19/2020, 08/15/2021   Influenza-Unspecified 06/10/2017   PFIZER(Purple Top)SARS-COV-2 Vaccination 12/23/2019, 01/06/2020, 09/23/2020   PNEUMOCOCCAL CONJUGATE-20 10/31/2021   Pneumococcal Polysaccharide-23 12/09/2018   Tdap 07/19/2020   Zoster Recombinat (Shingrix) 06/10/2017, 05/09/2021     Conditions to be addressed/monitored: HTN, HLD, and DMII  There are no care plans that you recently modified to display for this patient.     Medication Assistance:  Patient uses programs for both lantus and humalog, takes self-responsibility for paperwork & renewals.  [id number]: [phone]: [phone]  Uses pill box? Yes Pt endorses 100% compliance  Follow Up:  Patient agrees to Care Plan and Follow-up.  Plan: Telephone follow up appointment with care management team member scheduled for:  3-6 months  Larinda Buttery, PharmD Clinical Pharmacist [facility] [phone]

## 2022-04-16 ENCOUNTER — Encounter: Payer: Self-pay | Admitting: Pharmacist

## 2022-04-16 NOTE — Patient Instructions (Signed)
Visit Information  Thank you for taking time to visit with me today. Please don't hesitate to contact me if I can be of assistance to you before our next scheduled telephone appointment.  Following are the goals we discussed today:   Patient Goals/Self-Care Activities Over the next 90 days, patient will:  take medications as prescribed, work with provider on medication access solutions by filling out patient assistance paperwork  Follow Up Plan: Telephone follow up appointment with care management team member scheduled for:  3-6 months  Please call the care guide team at [phone] if you need to cancel or reschedule your appointment.    Patient verbalizes understanding of instructions and care plan provided today and agrees to view in MyChart. Active MyChart status and patient understanding of how to access instructions and care plan via MyChart confirmed with patient.      Erik Rose

## 2022-04-17 ENCOUNTER — Other Ambulatory Visit: Payer: Self-pay

## 2022-04-17 DIAGNOSIS — E1165 Type 2 diabetes mellitus with hyperglycemia: Secondary | ICD-10-CM

## 2022-04-17 MED ORDER — DROPSAFE ALCOHOL PREP 70 % PADS: 1.0000 | MEDICATED_PAD | 2 refills | Status: AC | PRN

## 2022-04-17 MED ORDER — ACCU-CHEK SOFTCLIX LANCETS MISC: 12 refills | Status: DC

## 2022-04-25 ENCOUNTER — Other Ambulatory Visit: Payer: Self-pay | Admitting: Family Medicine

## 2022-05-06 DIAGNOSIS — E1165 Type 2 diabetes mellitus with hyperglycemia: Secondary | ICD-10-CM

## 2022-05-06 DIAGNOSIS — Z794 Long term (current) use of insulin: Secondary | ICD-10-CM | POA: Diagnosis not present

## 2022-05-06 DIAGNOSIS — E782 Mixed hyperlipidemia: Secondary | ICD-10-CM

## 2022-05-06 DIAGNOSIS — I10 Essential (primary) hypertension: Secondary | ICD-10-CM | POA: Diagnosis not present

## 2022-05-06 NOTE — Progress Notes (Unsigned)
[facility] [place] PROGRESS NOTE  ASSESSMENT / PLAN: Erik Rose is a 66 y.o. male status post debridement of right medial calf wound and initiation of wound vac therapy 02/28/22. He underwent in situ right common femoral to peroneal bypass 01/02/22. Wound has nearly completely healed.  He can follow-up with me again in 3 months with repeat ABI.  SUBJECTIVE: No complaints. Wound is nearly completely healed.  He is planning to go [employer].  OBJECTIVE: BP 128/80 (BP Location: Left Arm, Patient Position: Sitting, Cuff Size: Normal)   Pulse 87   Temp 100 F (37.8 C)   Resp 20   Ht 5\' 10"  (1.778 m)   Wt 217 lb (98.4 kg)   SpO2 100%   BMI 31.14 kg/m   No acute distress Regular rate and rhythm Unlabored breathing Wound nearly resolved in right calf     Latest Ref Rng & Units 02/28/2022    9:15 AM 01/15/2022    3:57 PM 01/05/2022    2:00 AM  CBC  WBC 4.0 - 10.5 K/uL  14.8  19.6   Hemoglobin 13.0 - 17.0 g/dL 03/07/2022  8.9  8.0   Hematocrit 39.0 - 52.0 % 34.0  27.7  22.7   Platelets 150 - 400 K/uL  814  213         Latest Ref Rng & Units 02/28/2022    9:15 AM 01/15/2022    3:57 PM 01/05/2022    2:00 AM  CMP  Glucose 70 - 99 mg/dL 73  03/07/2022  267   BUN 8 - 23 mg/dL 18  13  16    Creatinine 0.61 - 1.24 mg/dL 124   5.80   Sodium 135 - 145 mmol/L 140  138  136   Potassium 3.5 - 5.1 mmol/L 4.8  4.6  3.6   Chloride 98 - 111 mmol/L 109  106  105   CO2 22 - 32 mmol/L  22  22   Calcium 8.9 - 10.3 mg/dL  9.2  8.5   Total Protein 6.5 - 8.1 g/dL  7.1    Total Bilirubin 0.3 - 1.2 mg/dL  0.5    Alkaline Phos 38 - 126 U/L  111    AST 15 - 41 U/L  25    ALT 0 - 44 U/L  30      +-------+-----------+-----------+------------+------------+  ABI/TBIToday's ABIToday's TBIPrevious ABIPrevious TBI  +-------+-----------+-----------+------------+------------+  Right  0.92       0.40       1.0         0.39           +-------+-----------+-----------+------------+------------+  Left   0.91       0.46       1.1         0.48          +-------+-----------+-----------+------------+------------+ .  Bypass graft duplex shows widely patent bypass without evidence of stenosis.  9.98. 3.38, MD [facility] Phone Number: [phone] 05/07/2022 2:22 PM

## 2022-05-07 ENCOUNTER — Ambulatory Visit: Payer: Medicare PPO | Admitting: Vascular Surgery

## 2022-05-07 ENCOUNTER — Encounter: Payer: Self-pay | Admitting: Vascular Surgery

## 2022-05-07 ENCOUNTER — Ambulatory Visit (INDEPENDENT_AMBULATORY_CARE_PROVIDER_SITE_OTHER)
Admission: RE | Admit: 2022-05-07 | Discharge: 2022-05-07 | Disposition: A | Discharge status: Home / Self Care | Payer: Medicare PPO | Source: Ambulatory Visit | Attending: Vascular Surgery | Admitting: Vascular Surgery

## 2022-05-07 ENCOUNTER — Ambulatory Visit (HOSPITAL_COMMUNITY)
Admission: RE | Admit: 2022-05-07 | Discharge: 2022-05-07 | Disposition: A | Discharge status: Home / Self Care | Payer: Medicare PPO | Source: Ambulatory Visit | Attending: Vascular Surgery | Admitting: Vascular Surgery

## 2022-05-07 VITALS — BP 128/80 | HR 87 | Temp 100.0°F | Resp 20 | Ht 70.0 in | Wt 217.0 lb

## 2022-05-07 DIAGNOSIS — I739 Peripheral vascular disease, unspecified: Secondary | ICD-10-CM

## 2022-05-07 DIAGNOSIS — I70221 Atherosclerosis of native arteries of extremities with rest pain, right leg: Secondary | ICD-10-CM

## 2022-05-09 ENCOUNTER — Other Ambulatory Visit: Payer: Self-pay

## 2022-05-09 DIAGNOSIS — I739 Peripheral vascular disease, unspecified: Secondary | ICD-10-CM

## 2022-05-15 ENCOUNTER — Ambulatory Visit: Payer: Medicare PPO | Admitting: Podiatry

## 2022-05-15 ENCOUNTER — Ambulatory Visit (INDEPENDENT_AMBULATORY_CARE_PROVIDER_SITE_OTHER): Payer: Medicare PPO | Admitting: Family Medicine

## 2022-05-15 ENCOUNTER — Encounter: Payer: Self-pay | Admitting: Podiatry

## 2022-05-15 ENCOUNTER — Encounter: Payer: Self-pay | Admitting: Family Medicine

## 2022-05-15 VITALS — BP 137/84 | HR 90 | Ht 70.0 in | Wt 216.0 lb

## 2022-05-15 DIAGNOSIS — Z794 Long term (current) use of insulin: Secondary | ICD-10-CM

## 2022-05-15 DIAGNOSIS — I10 Essential (primary) hypertension: Secondary | ICD-10-CM | POA: Diagnosis not present

## 2022-05-15 DIAGNOSIS — E782 Mixed hyperlipidemia: Secondary | ICD-10-CM

## 2022-05-15 DIAGNOSIS — E1165 Type 2 diabetes mellitus with hyperglycemia: Secondary | ICD-10-CM

## 2022-05-15 DIAGNOSIS — I739 Peripheral vascular disease, unspecified: Secondary | ICD-10-CM

## 2022-05-15 DIAGNOSIS — M79674 Pain in right toe(s): Secondary | ICD-10-CM

## 2022-05-15 DIAGNOSIS — M79675 Pain in left toe(s): Secondary | ICD-10-CM

## 2022-05-15 DIAGNOSIS — B351 Tinea unguium: Secondary | ICD-10-CM | POA: Diagnosis not present

## 2022-05-15 LAB — POCT GLYCOSYLATED HEMOGLOBIN (HGB A1C): HbA1c, POC (controlled diabetic range): 8.4 % — AB (ref 0.0–7.0)

## 2022-05-15 NOTE — Assessment & Plan Note (Signed)
Lab Results  Component Value Date   LDLCALC 46 01/03/2022  Tolerating atorvastatin well.

## 2022-05-15 NOTE — Assessment & Plan Note (Signed)
BP is fairly well controlled at this time.  Continue medications at current strength.

## 2022-05-15 NOTE — Assessment & Plan Note (Signed)
Lab Results  Component Value Date   HGBA1C 8.4 (A) 05/15/2022  Blood sugars are not well controlled.  Continue titration of lantus to 65 units daily.  Continue humalog at 15 units tid.  Work on dietary change.  Follow up in 3-4 months.

## 2022-05-15 NOTE — Patient Instructions (Signed)
Increase lantus to 65 units daily.  Continue humalog at 15 units with each meal.   See me again in 3-4 months.    Diabetes Mellitus and Nutrition, Adult When you have diabetes, or diabetes mellitus, it is very important to have healthy eating habits because your blood sugar (glucose) levels are greatly affected by what you eat and drink. Eating healthy foods in the right amounts, at about the same times every day, can help you: Manage your blood glucose. Lower your risk of heart disease. Improve your blood pressure. Reach or maintain a healthy weight. What can affect my meal plan? Every person with diabetes is different, and each person has different needs for a meal plan. Your health care provider may recommend that you work with a dietitian to make a meal plan that is best for you. Your meal plan may vary depending on factors such as: The calories you need. The medicines you take. Your weight. Your blood glucose, blood pressure, and cholesterol levels. Your activity level. Other health conditions you have, such as heart or kidney disease. How do carbohydrates affect me? Carbohydrates, also called carbs, affect your blood glucose level more than any other type of food. Eating carbs raises the amount of glucose in your blood. It is important to know how many carbs you can safely have in each meal. This is different for every person. Your dietitian can help you calculate how many carbs you should have at each meal and for each snack. How does alcohol affect me? Alcohol can cause a decrease in blood glucose (hypoglycemia), especially if you use insulin or take certain diabetes medicines by mouth. Hypoglycemia can be a life-threatening condition. Symptoms of hypoglycemia, such as sleepiness, dizziness, and confusion, are similar to symptoms of having too much alcohol. Do not drink alcohol if: Your health care provider tells you not to drink. You are pregnant, may be pregnant, or are planning  to become pregnant. If you drink alcohol: Limit how much you have to: 0-1 drink a day for women. 0-2 drinks a day for men. Know how much alcohol is in your drink. In the [place] one drink equals one 12 oz bottle of beer (355 mL), one 5 oz glass of wine (148 mL), or one 1 oz glass of hard liquor (44 mL). Keep yourself hydrated with water, diet soda, or unsweetened iced tea. Keep in mind that regular soda, juice, and other mixers may contain a lot of sugar and must be counted as carbs. What are tips for following this plan?  Reading food labels Start by checking the serving size on the Nutrition Facts label of packaged foods and drinks. The number of calories and the amount of carbs, fats, and other nutrients listed on the label are based on one serving of the item. Many items contain more than one serving per package. Check the total grams (g) of carbs in one serving. Check the number of grams of saturated fats and trans fats in one serving. Choose foods that have a low amount or none of these fats. Check the number of milligrams (mg) of salt (sodium) in one serving. Most people should limit total sodium intake to less than 2,300 mg per day. Always check the nutrition information of foods labeled as "low-fat" or "nonfat." These foods may be higher in added sugar or refined carbs and should be avoided. Talk to your dietitian to identify your daily goals for nutrients listed on the label. Shopping Avoid buying canned, pre-made, or processed  foods. These foods tend to be high in fat, sodium, and added sugar. Shop around the outside edge of the grocery store. This is where you will most often find fresh fruits and vegetables, bulk grains, fresh meats, and fresh dairy products. Cooking Use low-heat cooking methods, such as baking, instead of high-heat cooking methods, such as deep frying. Cook using healthy oils, such as olive, canola, or sunflower oil. Avoid cooking with butter, cream, or high-fat  meats. Meal planning Eat meals and snacks regularly, preferably at the same times every day. Avoid going long periods of time without eating. Eat foods that are high in fiber, such as fresh fruits, vegetables, beans, and whole grains. Eat 4-6 oz (112-168 g) of lean protein each day, such as lean meat, chicken, fish, eggs, or tofu. One ounce (oz) (28 g) of lean protein is equal to: 1 oz (28 g) of meat, chicken, or fish. 1 egg.  cup (62 g) of tofu. Eat some foods each day that contain healthy fats, such as avocado, nuts, seeds, and fish. What foods should I eat? Fruits Berries. Apples. Oranges. Peaches. Apricots. Plums. Grapes. Mangoes. Papayas. Pomegranates. Kiwi. Cherries. Vegetables Leafy greens, including lettuce, spinach, kale, chard, collard greens, mustard greens, and cabbage. Beets. Cauliflower. Broccoli. Carrots. Green beans. Tomatoes. Peppers. Onions. Cucumbers. Brussels sprouts. Grains Whole grains, such as whole-wheat or whole-grain bread, crackers, tortillas, cereal, and pasta. Unsweetened oatmeal. Quinoa. Brown or wild rice. Meats and other proteins Seafood. Poultry without skin. Lean cuts of poultry and beef. Tofu. Nuts. Seeds. Dairy Low-fat or fat-free dairy products such as milk, yogurt, and cheese. The items listed above may not be a complete list of foods and beverages you can eat and drink. Contact a dietitian for more information. What foods should I avoid? Fruits Fruits canned with syrup. Vegetables Canned vegetables. Frozen vegetables with butter or cream sauce. Grains Refined white flour and flour products such as bread, pasta, snack foods, and cereals. Avoid all processed foods. Meats and other proteins Fatty cuts of meat. Poultry with skin. Breaded or fried meats. Processed meat. Avoid saturated fats. Dairy Full-fat yogurt, cheese, or milk. Beverages Sweetened drinks, such as soda or iced tea. The items listed above may not be a complete list of foods and  beverages you should avoid. Contact a dietitian for more information. Questions to ask a health care provider Do I need to meet with a certified diabetes care and education specialist? Do I need to meet with a dietitian? What number can I call if I have questions? When are the best times to check my blood glucose? Where to find more information: American Diabetes Association: diabetes.org Academy of Nutrition and Dietetics: eatright.[employer] of Diabetes and Digestive and Kidney Diseases: StageSync.si Association of Diabetes Care & Education Specialists: diabeteseducator.org Summary It is important to have healthy eating habits because your blood sugar (glucose) levels are greatly affected by what you eat and drink. It is important to use alcohol carefully. A healthy meal plan will help you manage your blood glucose and lower your risk of heart disease. Your health care provider may recommend that you work with a dietitian to make a meal plan that is best for you. This information is not intended to replace advice given to you by your health care provider. Make sure you discuss any questions you have with your health care provider. Document Revised: 04/26/2020 Document Reviewed: 04/26/2020 Elsevier Patient Education  2023 [employer].

## 2022-05-15 NOTE — Progress Notes (Signed)
  Subjective:  Patient ID: Erik Rose, male    DOB: [birth year],   MRN: [id number]  No chief complaint on file.   66 y.o. male presents for concern of thickened elongated and painful nails that are difficult to trim. Requesting to have them trimmed today. Just recently underwent right leg bypass and legs are wrapped.  Relates burning and tingling in their feet. Patient is diabetic and last A1c was  Lab Results  Component Value Date   HGBA1C 8.4 (A) 05/15/2022   .   PCP:  Everrett Coombe, DO     . Denies any other pedal complaints. Denies n/v/f/c.   Past Medical History:  Diagnosis Date   Anemia    Diabetes mellitus without complication (HCC)    Diverticulitis    GERD (gastroesophageal reflux disease)    Heart disease    History of kidney stones    passed   Hypertension    Myocardial infarction (HCC)    "small one, blood numbers did not rise.   Peripheral vascular disease (HCC)    Stroke (HCC) 2018   no residual effects   TIA (transient ischemic attack) 2017    Objective:  Physical Exam: Vascular: DP/PT pulses 1/4 bilateral. CFT <3 seconds. Absent hair growth on digits. Edema noted to bilateral lower extremities. Xerosis noted bilaterally. Right leg wrapped due to recent bypass.  Skin. No lacerations or abrasions bilateral feet. Nails 1-5 bilateral  are thickened discolored and elongated with subungual debris.  Musculoskeletal: MMT 5/5 bilateral lower extremities in DF, PF, Inversion and Eversion. Deceased ROM in DF of ankle joint.  Neurological: Sensation intact to light touch. Protective sensation diminished bilateral.    Assessment:   1. Pain due to onychomycosis of toenails of both feet   2. Type 2 diabetes mellitus with hyperglycemia, with long-term current use of insulin (HCC)   3. PAD (peripheral artery disease) (HCC)      Plan:  Patient was evaluated and treated and all questions answered. -Discussed and educated patient on diabetic foot care,  especially with  regards to the vascular, neurological and musculoskeletal systems.  -Stressed the importance of good glycemic control and the detriment of not  controlling glucose levels in relation to the foot. -Discussed supportive shoes at all times and checking feet regularly.  -Mechanically debrided all nails 1-5 bilateral using sterile nail nipper and filed with dremel without incident  -Answered all patient questions -Patient to return  in 3 months for at risk foot care -Patient advised to call the office if any problems or questions arise in the meantime.   Louann Sjogren, DPM

## 2022-05-15 NOTE — Progress Notes (Signed)
Erik Rose - 66 y.o. male MRN [id number]  Date of birth: January 04, 1956  Subjective Chief Complaint  Patient presents with   Diabetes   Hypertension    HPI Erik Rose is a 66 y.o. male here today for follow up visit.   He has been doing well since having vascular surgery earlier this year.  He had dehiscence of wound and used wound vac.  Still with small area that has not quite healed.  Blood pressure today is fairly well controlled.  Tolerating current medications well without side effects.  No anginal symptoms, headache, chest pain or vision changes.  Blood sugars remain elevated.   He is using lantus 55 units daily and humalog 15 units tid.  Denies symptoms of hypoglycemia.    ROS:  A comprehensive ROS was completed and negative except as noted per HPI   No Known Allergies  Past Medical History:  Diagnosis Date   Anemia    Diabetes mellitus without complication (HCC)    Diverticulitis    GERD (gastroesophageal reflux disease)    Heart disease    History of kidney stones    passed   Hypertension    Myocardial infarction (HCC)    "small one, blood numbers did not rise.   Peripheral vascular disease (HCC)    Stroke (HCC) 2018   no residual effects   TIA (transient ischemic attack) 2017    Past Surgical History:  Procedure Laterality Date   ABDOMINAL AORTOGRAM W/LOWER EXTREMITY N/A 12/28/2021   Procedure: ABDOMINAL AORTOGRAM W/LOWER EXTREMITY;  Surgeon: [name], MD;  Location: [facility];  Service: Cardiovascular;  Laterality: N/A;   APPLICATION OF WOUND VAC Right 02/28/2022   Procedure: APPLICATION OF WOUND VAC;  Surgeon: [name], MD;  Location: [facility] OR;  Service: Vascular;  Laterality: Right;   BYPASS GRAFT FEMORAL-PERONEAL Right 01/02/2022   Procedure: RIGHT FEMORAL-PERONEAL BYPASS GRAFT;  Surgeon: [name], MD;  Location: [facility] OR;  Service: Vascular;  Laterality: Right;   CARDIAC CATHETERIZATION     CORONARY ANGIOPLASTY  WITH STENT PLACEMENT  2010   INCISION AND DRAINAGE OF WOUND Right 02/28/2022   Procedure: IRRIGATION AND DEBRIDEMENT RIGHT LEG WOUND;  Surgeon: [name], MD;  Location: [facility] OR;  Service: Vascular;  Laterality: Right;   LEG ANGIOGRAPHY Right 01/02/2022   Procedure: LEG ANGIOGRAPHY;  Surgeon: [name], MD;  Location: [facility];  Service: Vascular;  Laterality: Right;    Social History   Socioeconomic History   Marital status: Married    Spouse name: [name]   Number of children: 4   Years of education: 14   Highest education level: Associate degree: academic program  Occupational History    Comment: Disability/retired.  Tobacco Use   Smoking status: Every Day    Packs/day: 0.50    Years: 38.00    Total pack years: 19.00    Types: Cigarettes    Passive exposure: Never   Smokeless tobacco: Never   Tobacco comments:    5-8 cigarettes a day  Vaping Use   Vaping Use: Never used  Substance and Sexual Activity   Alcohol use: Yes    Comment: occasional   Drug use: Not Currently    Types: Marijuana, Cocaine    Comment: stopped 1991   Sexual activity: Yes    Partners: Female  Other Topics Concern   Not on file  Social History Narrative   Live alone. 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 Likes  to Golf range once a month.    Social Determinants of Health   Financial Resource Strain: Low Risk  (12/08/2020)   Overall Financial Resource Strain (CARDIA)    Difficulty of Paying Living Expenses: Not hard at all  Food Insecurity: No Food Insecurity (12/08/2020)   Hunger Vital Sign    Worried About Running Out of Food in the Last Year: Never true    Ran Out of Food in the Last Year: Never true  Transportation Needs: No Transportation Needs (12/08/2020)   PRAPARE - Administrator, Civil Service (Medical): No    Lack of Transportation (Non-Medical): No  Physical Activity: Sufficiently Active (12/08/2020)   Exercise Vital Sign    Days of Exercise per Week: 7 days    Minutes of  Exercise per Session: 120 min  Stress: No Stress Concern Present (12/08/2020)   [employer] of Occupational Health - Occupational Stress Questionnaire    Feeling of Stress : Not at all  Social Connections: Moderately Isolated (12/08/2020)   Social Connection and Isolation Panel [NHANES]    Frequency of Communication with Friends and Family: More than three times a week    Frequency of Social Gatherings with Friends and Family: More than three times a week    Attends Religious Services: Never    Database administrator or Organizations: No    Attends Engineer, structural: Never    Marital Status: Married    Family History  Problem Relation Age of Onset   Hypertension Mother    Kidney disease Mother    Miscarriages / [place] Mother    Stroke Mother    Heart disease Father    Depression Sister    Kidney disease Sister    Diabetes Brother    Hypertension Brother    Hyperlipidemia Brother    Kidney disease Brother     Health Maintenance  Topic Date Due   Diabetic kidney evaluation - Urine ACR  09/10/2019   FOOT EXAM  06/07/2022 (Originally 12/06/2021)   COLONOSCOPY (Pts 45-57yrs Insurance coverage will need to be confirmed)  06/07/2022 (Originally 01/17/2019)   COVID-19 Vaccine (4 - Pfizer series) 12/01/2022 (Originally 11/18/2020)   INFLUENZA VACCINE  01/05/2023 (Originally 05/07/2022)   Hepatitis C Screening  01/31/2023 (Originally 08/13/1974)   OPHTHALMOLOGY EXAM  08/27/2022   HEMOGLOBIN A1C  11/15/2022   Diabetic kidney evaluation - GFR measurement  01/16/2023   TETANUS/TDAP  07/19/2030   HIV Screening  Completed   Zoster Vaccines- Shingrix  Completed   HPV VACCINES  Aged Out     ----------------------------------------------------------------------------------------------------------------------------------------------------------------------------------------------------------------- Physical Exam BP 137/84 (BP Location: Left Arm, Patient Position: Sitting,  Cuff Size: Large)   Pulse 90   Ht 5\' 10"  (1.778 m)   Wt 216 lb (98 kg)   SpO2 100%   BMI 30.99 kg/m   Physical Exam Constitutional:      Appearance: Normal appearance.  Eyes:     General: No scleral icterus. Cardiovascular:     Rate and Rhythm: Normal rate and regular rhythm.  Pulmonary:     Effort: Pulmonary effort is normal.     Breath sounds: Normal breath sounds.  Musculoskeletal:     Cervical back: Neck supple.  Neurological:     Mental Status: He is alert.  Psychiatric:        Mood and Affect: Mood normal.        Behavior: Behavior normal.     ------------------------------------------------------------------------------------------------------------------------------------------------------------------------------------------------------------------- Assessment and Plan  Type 2 diabetes mellitus with  hyperglycemia, with long-term current use of insulin (HCC) Lab Results  Component Value Date   HGBA1C 8.4 (A) 05/15/2022  Blood sugars are not well controlled.  Continue titration of lantus to 65 units daily.  Continue humalog at 15 units tid.  Work on dietary change.  Follow up in 3-4 months.   Essential hypertension BP is fairly well controlled at this time.  Continue medications at current strength.    Mixed hyperlipidemia Lab Results  Component Value Date   LDLCALC 46 01/03/2022  Tolerating atorvastatin well.    No orders of the defined types were placed in this encounter.   Return in about 3 months (around 08/15/2022) for T2DM.    This visit occurred during the SARS-CoV-2 public health emergency.  Safety protocols were in place, including screening questions prior to the visit, additional usage of staff PPE, and extensive cleaning of exam room while observing appropriate contact time as indicated for disinfecting solutions.

## 2022-05-16 ENCOUNTER — Ambulatory Visit: Payer: Medicare PPO | Admitting: Podiatry

## 2022-05-26 DIAGNOSIS — Z20822 Contact with and (suspected) exposure to covid-19: Secondary | ICD-10-CM | POA: Diagnosis not present

## 2022-05-27 DIAGNOSIS — Z20822 Contact with and (suspected) exposure to covid-19: Secondary | ICD-10-CM | POA: Diagnosis not present

## 2022-05-29 ENCOUNTER — Encounter: Payer: Self-pay | Admitting: General Practice

## 2022-05-30 DIAGNOSIS — Z20822 Contact with and (suspected) exposure to covid-19: Secondary | ICD-10-CM | POA: Diagnosis not present

## 2022-05-31 DIAGNOSIS — Z20822 Contact with and (suspected) exposure to covid-19: Secondary | ICD-10-CM | POA: Diagnosis not present

## 2022-06-03 DIAGNOSIS — Z20822 Contact with and (suspected) exposure to covid-19: Secondary | ICD-10-CM | POA: Diagnosis not present

## 2022-06-04 DIAGNOSIS — Z20822 Contact with and (suspected) exposure to covid-19: Secondary | ICD-10-CM | POA: Diagnosis not present

## 2022-06-07 DIAGNOSIS — Z20822 Contact with and (suspected) exposure to covid-19: Secondary | ICD-10-CM | POA: Diagnosis not present

## 2022-06-08 DIAGNOSIS — Z20822 Contact with and (suspected) exposure to covid-19: Secondary | ICD-10-CM | POA: Diagnosis not present

## 2022-06-11 DIAGNOSIS — Z20822 Contact with and (suspected) exposure to covid-19: Secondary | ICD-10-CM | POA: Diagnosis not present

## 2022-06-12 DIAGNOSIS — Z20822 Contact with and (suspected) exposure to covid-19: Secondary | ICD-10-CM | POA: Diagnosis not present

## 2022-06-26 ENCOUNTER — Other Ambulatory Visit: Payer: Self-pay

## 2022-06-26 DIAGNOSIS — E1165 Type 2 diabetes mellitus with hyperglycemia: Secondary | ICD-10-CM

## 2022-07-12 NOTE — Progress Notes (Deleted)
Chronic Care Management Pharmacy Note  07/12/2022 Name:  Erik Rose MRN:  [id number] DOB:  Sep 21, 1956  Summary: addressed DM, HTN, HLD. Patient uses patient assistance programs for both lantus and humalog & is a great self-advocate of paperwork & ongoing needs. They are approved for 2023. However, there is mention of switching from lantus to toujeo.  BG checks at home: 120-130s fasting   He has been getting diabetes-conscious meals once every 2 weeks via [employer] program; gets them for 12 more weeks.  Recommendations/Changes made from today's visit: none, will provide toujeo paperwork for patient assistance via mychart message as patient requests. If he cannot print, we can provide a hard copy at the front desk.  Plan: f/u with pharmacist in 3-6 months  Subjective: Erik Rose is an 66 y.o. year old male who is a primary patient of [name], DO.  The CCM team was consulted for assistance with disease management and care coordination needs.    Engaged with patient by telephone for follow up visit in response to provider referral for pharmacy case management and/or care coordination services.   Consent to Services:  The patient was given information about Chronic Care Management services, agreed to services, and gave verbal consent prior to initiation of services.  Please see initial visit note for detailed documentation.   Patient Care Team: [name], DO as PCP - General (Family Medicine) Freada Bergeron, MD as PCP - Cardiology (Cardiology) [name], [facility] as Pharmacist (Pharmacist)  Recent office visits:  05/30/21-Thomas J. Dianah Field, MD. Lorenda Cahill for lumbar spinal stenosis. Start prednisone 50 mg for 5 days. Increase gabapentin 300 mg. Follow up in 4-6 weeks. 05/09/21-Cody Zigmund Daniel, DO (PCP) Seen for general follow up.  Increase Lantus to 35 units twice daily.  Continue Humalog 15 units 3 times daily.  Adding metformin XR back on at 1000 mg  daily. Follow up in 3 months. 03/15/21-Cody Zigmund Daniel, DO (PCP) Seen for hip pain. Start prednisone taper and tizanidine as needed. 02/05/21-Cody Zigmund Daniel, DO (PCP) General follow up. Start on amlodipine 5 mg. Increase lantus to 60 units split to 30 units BID. New neurosurgery referral entered. Follow up in 3 months.   Recent consult visits:  06/20/21-Karen K. Donawerth, PT (Rehabilitation) 06/15/21-Karen K. Donawerth, PT (Rehabilitation) 06/13/21-Jennifer L. [place]inson (Rehabilitation) 06/06/21-Karen K. Donawerth, PT (Rehabilitation) 06/01/21-Christina M. Kathlen Mody, PT (Rehabilitation) 03/12/21-Heather Renae Fickle, MD (Cardiology) Seen for general follow up. Stopped ozempic due to pancreatitis. Follow up in 8 months. 02/28/21-[facility] [place] [facility]. Notes not available. 02/14/21-Neelesh Nundkumar (Neurosurgery) Notes not available.   Hospital visits:  None in previous 6 months  Objective:  Lab Results  Component Value Date   CREATININE 0.90 02/28/2022   CREATININE 0.96 01/15/2022   CREATININE 1.13 01/05/2022    Lab Results  Component Value Date   HGBA1C 8.4 (A) 05/15/2022      Component Value Date/Time   CHOL 97 01/03/2022 0500   CHOL 138 09/14/2020 1133   TRIG 66 01/03/2022 0500   HDL 38 (L) 01/03/2022 0500   HDL 53 09/14/2020 1133   CHOLHDL 2.6 01/03/2022 0500   VLDL 13 01/03/2022 0500   LDLCALC 46 01/03/2022 0500   LDLCALC 60 11/15/2021 0000   LDLDIRECT 112.0 09/09/2018 1513       Latest Ref Rng & Units 01/15/2022    3:57 PM 01/02/2022    8:12 AM 11/15/2021   12:00 AM  Hepatic Function  Total Protein 6.5 - 8.1 g/dL 7.1  6.7  6.9  Albumin 3.5 - 5.0 g/dL 3.3  3.5    AST 15 - 41 U/L '25  25  14   ' ALT 0 - 44 U/L 30  37  11   Alk Phosphatase 38 - 126 U/L 111  87    Total Bilirubin 0.3 - 1.2 mg/dL 0.5  0.6  0.4        Latest Ref Rng & Units 02/28/2022    9:15 AM 01/15/2022    3:57 PM 01/05/2022    2:00 AM  CBC  WBC 4.0 - 10.5 K/uL  14.8  19.6    Hemoglobin 13.0 - 17.0 g/dL 11.6  8.9  8.0   Hematocrit 39.0 - 52.0 % 34.0  27.7  22.7   Platelets 150 - 400 K/uL  814  213     No results found for: "VD25OH"  Clinical ASCVD: Yes  The ASCVD Risk score (Arnett DK, et al., 2019) failed to calculate for the following reasons:   The patient has a prior MI or stroke diagnosis    Social History   Tobacco Use  Smoking Status Every Day   Packs/day: 0.50   Years: 38.00   Total pack years: 19.00   Types: Cigarettes   Passive exposure: Never  Smokeless Tobacco Never  Tobacco Comments   5-8 cigarettes a day   BP Readings from Last 3 Encounters:  05/15/22 137/84  05/07/22 128/80  03/26/22 120/73   Pulse Readings from Last 3 Encounters:  05/15/22 90  05/07/22 87  03/26/22 88   Wt Readings from Last 3 Encounters:  05/15/22 216 lb (98 kg)  05/07/22 217 lb (98.4 kg)  03/26/22 216 lb (98 kg)    Assessment: Review of patient past medical history, allergies, medications, health status, including review of consultants reports, laboratory and other test data, was performed as part of comprehensive evaluation and provision of chronic care management services.   SDOH:  (Social Determinants of Health) assessments and interventions performed:  SDOH Interventions    Flowsheet Row Office Visit from 12/08/2020 in [place] Interventions Intervention Not Indicated  Housing Interventions Intervention Not Indicated  Transportation Interventions Intervention Not Indicated  Financial Strain Interventions Intervention Not Indicated  Physical Activity Interventions Intervention Not Indicated  Stress Interventions Intervention Not Indicated  Social Connections Interventions Intervention Not Indicated       CCM Care Plan  No Known Allergies  Medications Reviewed Today     Reviewed by Lorenda Peck, DPM (Physician) on 05/15/22 at 1038  Med List Status: <None>    Medication Order Taking? Sig Documenting Provider Last Dose Status Informant  Accu-Chek Softclix Lancets lancets [id number] No Use as instructed [name], DO Taking Active   acetaminophen (TYLENOL) 500 MG tablet [id number] No Take 1,000 mg by mouth every 6 (six) hours as needed (pain.). [provider] Taking Active Self  Alcohol Swabs (DROPSAFE ALCOHOL PREP) 70 % PADS [id number] No 1 Pad by Does not apply route as needed. [name], DO Taking Active   AMBULATORY NON [facility] MEDICATION [id number] No Bedside commode [name], DO Taking Active Self  AMBULATORY NON FORMULARY MEDICATION [id number] No Please provide 3 in 1 bedside commode for patient.  Diagnosis:  Post operative state Z98.890, Gait instability R26.81 [name], DO Taking Active Self  amLODipine (NORVASC) 5 MG tablet [id number] No Take 1 tablet (5 mg total) by mouth daily. [name], DO Taking Active Self  aspirin EC 81 MG tablet  [id number] No Take 1 tablet (81 mg total) by mouth daily. Swallow whole. Freada Bergeron, MD Taking Active Self  atorvastatin (LIPITOR) 80 MG tablet [id number] No Take 1 tablet (80 mg total) by mouth daily. [name], DO Taking Active   Blood Glucose Monitoring Suppl (ACCU-CHEK AVIVA PLUS) w/Device KIT [id number] No USE DAILY TO MONITOR BLOOD GLUCOSE [name], DO Taking Active   carvedilol (COREG) 12.5 MG tablet [id number] No TAKE 1 TABLET(12.5 MG) BY MOUTH TWICE DAILY WITH A MEAL [name], DO Taking Active Self  esomeprazole (NEXIUM) 20 MG capsule [id number] No Take 20 mg by mouth every morning. OTC [provider] Taking Active Self  gabapentin (NEURONTIN) 300 MG capsule [id number] No One tab PO qHS for a week, then BID for a week, then TID. May double weekly to a max of 3,637m/day TSilverio Decamp MD Taking Active            Med Note (Rikki SpearingJul 10, 2023 10:26 AM) Taking as needed, as of 04/15/22 review  glucose blood (ACCU-CHEK  AVIVA PLUS) test strip [id number] Use as instructed MLuetta Nutting DO Taking Active   insulin glargine (LANTUS SOLOSTAR) 100 UNIT/ML Solostar Pen [id number] ADMINISTER 55 UNITS UNDER THE SKIN DAILY MLuetta Nutting DO Taking Active Self  insulin lispro (HUMALOG KWIKPEN) 100 UNIT/ML KwikPen [id number] DIAL AND INJECT 15 UNITS UNDER THE SKIN 3 TIMES DAILY. MLuetta Nutting DO Taking Active Self  lisinopril (ZESTRIL) 40 MG tablet [id number] Take 1 tablet (40 mg total) by mouth daily. PFreada Bergeron MD Taking Active   loratadine (CLARITIN) 10 MG tablet [id number] Take 10 mg by mouth in the morning. [provider] Taking Active Self  Magnesium 250 MG TABS [id number] Take 250 mg by mouth in the morning. [provider] Taking Active Self  meloxicam (MOBIC) 15 MG tablet [id number] Take 1 tablet (15 mg total) by mouth daily as needed for pain. Take daily for 10 days and then as needed. MLuetta Nutting DO Taking Active   metFORMIN (GLUCOPHAGE-XR) 500 MG 24 hr tablet [id number] TAKE 2 TABLETS(1000 MG) BY MOUTH DAILY WITH BREAKFAST MLuetta Nutting DO Taking Active   Multiple Vitamins-Minerals (MULTIVITAMIN WITH MINERALS) tablet [id number] Take 1 tablet by mouth daily. Centrum 50+ [provider] Taking Active Self  naproxen sodium (ALEVE) 220 MG tablet [id number] Take 440 mg by mouth 2 (two) times daily as needed (pain.). [provider] Taking Active Self            Patient Active Problem List   Diagnosis Date Noted   PAD (peripheral artery disease) ([place] 01/02/2022   Intermittent claudication ([place] 10/31/2021   COVID-19 10/03/2021   Left knee pain 08/15/2021   Lumbar spinal stenosis 03/15/2021   Pancreatitis 12/10/2020   Cervical radiculopathy 04/18/2020   Smoking greater than 20 pack years 04/18/2020   Eczema 01/18/2020   Colon cancer screening 12/09/2018   Essential hypertension 09/09/2018   Type 2 diabetes mellitus with  hyperglycemia, with long-term current use of insulin ([place] 09/09/2018   CAD (coronary artery disease), native coronary artery 09/09/2018   History of CVA (cerebrovascular accident) 09/09/2018   Mixed hyperlipidemia 02/05/2018    Immunization History  Administered Date(s) Administered   Influenza, Quadrivalent, Recombinant, Inj, Pf 07/17/2018   Influenza,inj,Quad PF,6+ Mos 06/10/2017, 06/22/2019, 07/19/2020, 08/15/2021   Influenza-Unspecified 06/10/2017   PFIZER(Purple Top)SARS-COV-2 Vaccination 12/23/2019, 01/06/2020, 09/23/2020   PNEUMOCOCCAL CONJUGATE-20 10/31/2021   Pneumococcal Polysaccharide-23  12/09/2018   Tdap 07/19/2020   Zoster Recombinat (Shingrix) 06/10/2017, 05/09/2021    Conditions to be addressed/monitored: HTN, HLD, and DMII  There are no care plans that you recently modified to display for this patient.     Medication Assistance:  Patient uses programs for both lantus and humalog, takes self-responsibility for paperwork & renewals.  Patient's preferred pharmacy is:  [id number] [address], [place] [address] Phone: [phone] Fax: [phone]   Uses pill box? Yes Pt endorses 100% compliance  Follow Up:  Patient agrees to Care Plan and Follow-up.  Plan: Telephone follow up appointment with care management team member scheduled for:  3-6 months  Larinda Buttery, PharmD Clinical Pharmacist [facility] [phone]

## 2022-07-16 ENCOUNTER — Telehealth: Payer: Medicare PPO

## 2022-07-24 ENCOUNTER — Ambulatory Visit (INDEPENDENT_AMBULATORY_CARE_PROVIDER_SITE_OTHER): Payer: Medicare PPO | Admitting: Pharmacist

## 2022-07-24 DIAGNOSIS — E782 Mixed hyperlipidemia: Secondary | ICD-10-CM

## 2022-07-24 DIAGNOSIS — Z794 Long term (current) use of insulin: Secondary | ICD-10-CM

## 2022-07-24 DIAGNOSIS — I10 Essential (primary) hypertension: Secondary | ICD-10-CM

## 2022-07-24 DIAGNOSIS — E1165 Type 2 diabetes mellitus with hyperglycemia: Secondary | ICD-10-CM

## 2022-07-24 NOTE — Progress Notes (Signed)
Chronic Care Management Pharmacy Note  07/24/2022 Name:  Erik Rose MRN:  [id number] DOB:  1955-12-18  Summary: addressed DM, HTN, HLD. Patient uses patient assistance programs for both lantus and humalog & is a great self-advocate of paperwork & ongoing needs. They are approved for 2023.   BG checks at home: 100-130s fasting. BG 160-170s postprandial.  Lantus 60 units daily in morning Humalog 20 units with meals  Today patient reports sleep difficulty. Goes to sleep 10p-midnight, some nights will get 6-7hr of sleep, but other nights will be up every few hours. He has tried: Tylenol PM, melatonin 15 mg gummies, Zzquil.   Recommendations/Changes made from today's visit:  - No changes, reminded patient to keep [place] informed if receives any correspondence about patient assistance forms needing renewed. - Counseled on non-pharmacologic therapy for insomnia - Patient requests gabapentin and meloxicam refills, at PCP discretion  Plan: f/u with pharmacist in 3-6 months  Subjective: Erik Rose is an 66 y.o. year old male who is a primary patient of [name], DO.  The CCM team was consulted for assistance with disease management and care coordination needs.    Engaged with patient by telephone for follow up visit in response to provider referral for pharmacy case management and/or care coordination services.   Consent to Services:  The patient was given information about Chronic Care Management services, agreed to services, and gave verbal consent prior to initiation of services.  Please see initial visit note for detailed documentation.   Patient Care Team: [name], DO as PCP - General (Family Medicine) Freada Bergeron, MD as PCP - Cardiology (Cardiology) [name], [facility] as Pharmacist (Pharmacist)  Recent office visits:  05/30/21-Thomas J. Dianah Field, MD. Lorenda Cahill for lumbar spinal stenosis. Start prednisone 50 mg for 5 days. Increase gabapentin  300 mg. Follow up in 4-6 weeks. 05/09/21-Cody Zigmund Daniel, DO (PCP) Seen for general follow up.  Increase Lantus to 35 units twice daily.  Continue Humalog 15 units 3 times daily.  Adding metformin XR back on at 1000 mg daily. Follow up in 3 months. 03/15/21-Cody Zigmund Daniel, DO (PCP) Seen for hip pain. Start prednisone taper and tizanidine as needed. 02/05/21-Cody Zigmund Daniel, DO (PCP) General follow up. Start on amlodipine 5 mg. Increase lantus to 60 units split to 30 units BID. New neurosurgery referral entered. Follow up in 3 months.   Recent consult visits:  06/20/21-Karen K. Donawerth, PT (Rehabilitation) 06/15/21-Karen K. Donawerth, PT (Rehabilitation) 06/13/21-Jenn[facility]. [place] (Rehabilitation) 06/06/21-Karen K. Donawerth, PT (Rehabilitation) 06/01/21-Christina M. Kathlen Mody, PT (Rehabilitation) 03/12/21-Heather Renae Fickle, MD (Cardiology) Seen for general follow up. Stopped ozempic due to pancreatitis. Follow up in 8 months. 02/28/21-[place] [facility]. Notes not available. 02/14/21-Neelesh Nundkumar (Neurosurgery) Notes not available.   Hospital visits:  None in previous 6 months  Objective:  Lab Results  Component Value Date   CREATININE 0.90 02/28/2022   CREATININE 0.96 01/15/2022   CREATININE 1.13 01/05/2022    Lab Results  Component Value Date   HGBA1C 8.4 (A) 05/15/2022      Component Value Date/Time   CHOL 97 01/03/2022 0500   CHOL 138 09/14/2020 1133   TRIG 66 01/03/2022 0500   HDL 38 (L) 01/03/2022 0500   HDL 53 09/14/2020 1133   CHOLHDL 2.6 01/03/2022 0500   VLDL 13 01/03/2022 0500   LDLCALC 46 01/03/2022 0500   LDLCALC 60 11/15/2021 0000   LDLDIRECT 112.0 09/09/2018 1513       Latest Ref Rng & Units 01/15/2022  3:57 PM 01/02/2022    8:12 AM 11/15/2021   12:00 AM  Hepatic Function  Total Protein 6.5 - 8.1 g/dL 7.1  6.7  6.9   Albumin 3.5 - 5.0 g/dL 3.3  3.5    AST 15 - 41 U/L _0 ALT 0 - 44 U/L 30  37  11   Alk Phosphatase  38 - 126 U/L 111  87    Total Bilirubin 0.3 - 1.2 mg/dL 0.5  0.6  0.4        Latest Ref Rng & Units 02/28/2022    9:15 AM 01/15/2022    3:57 PM 01/05/2022    2:00 AM  CBC  WBC 4.0 - 10.5 K/uL  14.8  19.6   Hemoglobin 13.0 - 17.0 g/dL 11.6  8.9  8.0   Hematocrit 39.0 - 52.0 % 34.0  27.7  22.7   Platelets 150 - 400 K/uL  814  213     No results found for: "VD25OH"  Clinical ASCVD: Yes  The ASCVD Risk score (Arnett DK, et al., 2019) failed to calculate for the following reasons:   The patient has a prior MI or stroke diagnosis    Social History   Tobacco Use  Smoking Status Every Day   Packs/day: 0.50   Years: 38.00   Total pack years: 19.00   Types: Cigarettes   Passive exposure: Never  Smokeless Tobacco Never  Tobacco Comments   5-8 cigarettes a day   BP Readings from Last 3 Encounters:  05/15/22 137/84  05/07/22 128/80  03/26/22 120/73   Pulse Readings from Last 3 Encounters:  05/15/22 90  05/07/22 87  03/26/22 88   Wt Readings from Last 3 Encounters:  05/15/22 216 lb (98 kg)  05/07/22 217 lb (98.4 kg)  03/26/22 216 lb (98 kg)    Assessment: Review of patient past medical history, allergies, medications, health status, including review of consultants reports, laboratory and other test data, was performed as part of comprehensive evaluation and provision of chronic care management services.   SDOH:  (Social Determinants of Health) assessments and interventions performed:  SDOH Interventions    Flowsheet Row Office Visit from 12/08/2020 in [place] Interventions Intervention Not Indicated  Housing Interventions Intervention Not Indicated  Transportation Interventions Intervention Not Indicated  Financial Strain Interventions Intervention Not Indicated  Physical Activity Interventions Intervention Not Indicated  Stress Interventions Intervention Not Indicated  Social Connections  Interventions Intervention Not Indicated       CCM Care Plan  No Known Allergies  Medications Reviewed Today     Reviewed by Lorenda Peck, DPM (Physician) on 05/15/22 at 1038  Med List Status: <None>   Medication Order Taking? Sig Documenting Provider Last Dose Status Informant  Accu-Chek Softclix Lancets lancets [id number] No Use as instructed [name], DO Taking Active   acetaminophen (TYLENOL) 500 MG tablet [id number] No Take 1,000 mg by mouth every 6 (six) hours as needed (pain.). [provider] Taking Active Self  Alcohol Swabs (DROPSAFE ALCOHOL PREP) 70 % PADS [id number] No 1 Pad by Does not apply route as needed. [name], DO Taking Active   AMBULATORY NON [facility] MEDICATION [id number] No Bedside commode [name], DO Taking Active Self  AMBULATORY NON FORMULARY MEDICATION [id number] No Please provide 3 in 1 bedside commode for patient.  Diagnosis:  Post operative state Z98.890, Gait instability R26.81 [name], DO Taking Active  Self  amLODipine (NORVASC) 5 MG tablet [id number] No Take 1 tablet (5 mg total) by mouth daily. [name], DO Taking Active Self  aspirin EC 81 MG tablet [id number] No Take 1 tablet (81 mg total) by mouth daily. Swallow whole. Freada Bergeron, MD Taking Active Self  atorvastatin (LIPITOR) 80 MG tablet [id number] No Take 1 tablet (80 mg total) by mouth daily. [name], DO Taking Active   Blood Glucose Monitoring Suppl (ACCU-CHEK AVIVA PLUS) w/Device KIT [id number] No USE DAILY TO MONITOR BLOOD GLUCOSE [name], DO Taking Active   carvedilol (COREG) 12.5 MG tablet [id number] No TAKE 1 TABLET(12.5 MG) BY MOUTH TWICE DAILY WITH A MEAL [name], DO Taking Active Self  esomeprazole (NEXIUM) 20 MG capsule [id number] No Take 20 mg by mouth every morning. OTC [provider] Taking Active Self  gabapentin (NEURONTIN) 300 MG capsule [id number] No One tab PO qHS for a week, then BID for a week, then TID. May  double weekly to a max of 3,681m/day TSilverio Decamp MD Taking Active            Med Note (Rikki SpearingJul 10, 2023 10:26 AM) Taking as needed, as of 04/15/22 review  glucose blood (ACCU-CHEK AVIVA PLUS) test strip [id number] Use as instructed MLuetta Nutting DO Taking Active   insulin glargine (LANTUS SOLOSTAR) 100 UNIT/ML Solostar Pen [id number] ADMINISTER 55 UNITS UNDER THE SKIN DAILY MLuetta Nutting DO Taking Active Self  insulin lispro (HUMALOG KWIKPEN) 100 UNIT/ML KwikPen [id number] DIAL AND INJECT 15 UNITS UNDER THE SKIN 3 TIMES DAILY. MLuetta Nutting DO Taking Active Self  lisinopril (ZESTRIL) 40 MG tablet [id number] Take 1 tablet (40 mg total) by mouth daily. PFreada Bergeron MD Taking Active   loratadine (CLARITIN) 10 MG tablet [id number] Take 10 mg by mouth in the morning. [provider] Taking Active Self  Magnesium 250 MG TABS [id number] Take 250 mg by mouth in the morning. [provider] Taking Active Self  meloxicam (MOBIC) 15 MG tablet [id number] Take 1 tablet (15 mg total) by mouth daily as needed for pain. Take daily for 10 days and then as needed. MLuetta Nutting DO Taking Active   metFORMIN (GLUCOPHAGE-XR) 500 MG 24 hr tablet [id number] TAKE 2 TABLETS(1000 MG) BY MOUTH DAILY WITH BREAKFAST MLuetta Nutting DO Taking Active   Multiple Vitamins-Minerals (MULTIVITAMIN WITH MINERALS) tablet [id number] Take 1 tablet by mouth daily. Centrum 50+ [provider] Taking Active Self  naproxen sodium (ALEVE) 220 MG tablet [id number] Take 440 mg by mouth 2 (two) times daily as needed (pain.). [provider] Taking Active Self            Patient Active Problem List   Diagnosis Date Noted   PAD (peripheral artery disease) ([place] 01/02/2022   Intermittent claudication ([place] 10/31/2021   COVID-19 10/03/2021   Left knee pain 08/15/2021   Lumbar spinal stenosis 03/15/2021   Pancreatitis 12/10/2020   Cervical  radiculopathy 04/18/2020   Smoking greater than 20 pack years 04/18/2020   Eczema 01/18/2020   Colon cancer screening 12/09/2018   Essential hypertension 09/09/2018   Type 2 diabetes mellitus with hyperglycemia, with long-term current use of insulin ([place] 09/09/2018   CAD (coronary artery disease), native coronary artery 09/09/2018   History of CVA (cerebrovascular accident) 09/09/2018   Mixed hyperlipidemia 02/05/2018    Immunization History  Administered Date(s) Administered   Influenza, Quadrivalent, Recombinant, Inj, Pf 07/17/2018  Influenza,inj,Quad PF,6+ Mos 06/10/2017, 06/22/2019, 07/19/2020, 08/15/2021   Influenza-Unspecified 06/10/2017   PFIZER(Purple Top)SARS-COV-2 Vaccination 12/23/2019, 01/06/2020, 09/23/2020   PNEUMOCOCCAL CONJUGATE-20 10/31/2021   Pneumococcal Polysaccharide-23 12/09/2018   Tdap 07/19/2020   Zoster Recombinat (Shingrix) 06/10/2017, 05/09/2021    Conditions to be addressed/monitored: HTN, HLD, and DMII  There are no care plans that you recently modified to display for this patient.     Medication Assistance:  Patient uses programs for both lantus and humalog, takes self-responsibility for paperwork & renewals.  Patient's preferred pharmacy is:  [id number] [address], [place] [address] Phone: [phone] Fax: [phone]   Uses pill box? Yes Pt endorses 100% compliance  Follow Up:  Patient agrees to Care Plan and Follow-up.  Plan: Telephone follow up appointment with care management team member scheduled for:  3-6 months  Larinda Buttery, PharmD Clinical Pharmacist [facility] [phone]

## 2022-07-24 NOTE — Patient Instructions (Signed)
Khayman,  It was great chatting today! No changes to medication - keep [place] updated if you get any letters in the mail about needing to renew the yearly applications for your insulins.  Take care, Luana Shu, PharmD Clinical Pharmacist [facility] At [facility] [phone]

## 2022-07-25 ENCOUNTER — Other Ambulatory Visit: Payer: Self-pay | Admitting: Family Medicine

## 2022-07-25 DIAGNOSIS — I10 Essential (primary) hypertension: Secondary | ICD-10-CM

## 2022-07-25 DIAGNOSIS — Z794 Long term (current) use of insulin: Secondary | ICD-10-CM

## 2022-07-25 DIAGNOSIS — M48061 Spinal stenosis, lumbar region without neurogenic claudication: Secondary | ICD-10-CM

## 2022-07-25 MED ORDER — GABAPENTIN 300 MG PO CAPS: ORAL_CAPSULE | ORAL | 0 refills | Status: DC

## 2022-07-25 MED ORDER — MELOXICAM 15 MG PO TABS
15.0000 mg | ORAL_TABLET | Freq: Every day | ORAL | 0 refills | Status: DC | PRN
Start: 2022-07-25 — End: 2022-10-29

## 2022-07-31 ENCOUNTER — Telehealth: Payer: Self-pay

## 2022-07-31 NOTE — Chronic Care Management (AMB) (Signed)
  Care Coordination  Outreach Note  07/31/2022 Name: Erik Rose MRN: [id number] DOB: [birth year]   Care Coordination Outreach Attempts: An unsuccessful telephone outreach was attempted today to offer the patient information about available care coordination services as a benefit of their health plan.   Follow Up Plan:  Additional outreach attempts will be made to offer the patient care coordination information and services.   Encounter Outcome:  No Answer  Sig Noreene Larsson, [place], [place] [address] Direct Dial: [phone] [email or web address]

## 2022-08-02 NOTE — Progress Notes (Signed)
Pt spoke with Pharm D 07/24/2022

## 2022-08-13 ENCOUNTER — Ambulatory Visit (HOSPITAL_COMMUNITY)
Admission: RE | Admit: 2022-08-13 | Discharge: 2022-08-13 | Disposition: A | Discharge status: Home / Self Care | Payer: Medicare PPO | Source: Ambulatory Visit | Attending: Surgery | Admitting: Surgery

## 2022-08-13 ENCOUNTER — Ambulatory Visit: Payer: Medicare PPO | Admitting: Physician Assistant

## 2022-08-13 ENCOUNTER — Encounter: Payer: Self-pay | Admitting: Physician Assistant

## 2022-08-13 VITALS — BP 142/83 | HR 83 | Temp 97.0°F | Ht 70.0 in | Wt 215.8 lb

## 2022-08-13 DIAGNOSIS — I739 Peripheral vascular disease, unspecified: Secondary | ICD-10-CM | POA: Insufficient documentation

## 2022-08-13 NOTE — Progress Notes (Signed)
Office Note     CC:  follow up Requesting Provider:  [name], DO  HPI: Erik Rose is a 66 y.o. (27-Jun-1956) male who presents for follow up of PAD. He most recently underwent debridement of right medial calf wound and initiation of wound vac therapy 02/28/22 by Dr. Stanford Breed. Prior to this he underwent in situ right common femoral to peroneal bypass 01/02/22. At time of his last visit in August with Dr. Stanford Breed, the wound was almost completely healed. He was not having any rest pain or claudication. His non invasive studies showed widely patent bypass without stenosis.  Today he denies any pain on ambulation or rest. No tissue loss. He says he very rarely will get some stabbing like pains in his toes but this lasts just a couple seconds and then goes away. He has been walking a lot more now that his right leg wound has healed. He also works a lot. He just celebrated his 66th birthday yesterday and explains that he and his daughter are headed to the [place] to celebrate next month.   The pt is on a statin for cholesterol management.  The pt is on a daily aspirin.   Other AC:  none The pt is on BB, CCB for hypertension.   The pt is diabetic.  Tobacco hx:  current  Past Medical History:  Diagnosis Date   Anemia    Diabetes mellitus without complication (HCC)    Diverticulitis    GERD (gastroesophageal reflux disease)    Heart disease    History of kidney stones    passed   Hypertension    Myocardial infarction ([place])    "small one, blood numbers did not rise.   Peripheral vascular disease ([place])    Stroke ([place]) 2018   no residual effects   TIA (transient ischemic attack) 2017    Past Surgical History:  Procedure Laterality Date   ABDOMINAL AORTOGRAM W/LOWER EXTREMITY N/A 12/28/2021   Procedure: ABDOMINAL AORTOGRAM W/LOWER EXTREMITY;  Surgeon: Cherre Robins, MD;  Location: [facility];  Service: Cardiovascular;  Laterality: N/A;   APPLICATION OF WOUND  VAC Right 02/28/2022   Procedure: APPLICATION OF WOUND VAC;  Surgeon: Cherre Robins, MD;  Location: [facility];  Service: Vascular;  Laterality: Right;   BYPASS GRAFT FEMORAL-PERONEAL Right 01/02/2022   Procedure: RIGHT FEMORAL-PERONEAL BYPASS GRAFT;  Surgeon: Cherre Robins, MD;  Location: [facility];  Service: Vascular;  Laterality: Right;   CARDIAC CATHETERIZATION     CORONARY ANGIOPLASTY WITH STENT PLACEMENT  2010   INCISION AND DRAINAGE OF WOUND Right 02/28/2022   Procedure: IRRIGATION AND DEBRIDEMENT RIGHT LEG WOUND;  Surgeon: Cherre Robins, MD;  Location: [facility];  Service: Vascular;  Laterality: Right;   LEG ANGIOGRAPHY Right 01/02/2022   Procedure: LEG ANGIOGRAPHY;  Surgeon: Cherre Robins, MD;  Location: [facility];  Service: Vascular;  Laterality: Right;    Social History   Socioeconomic History   Marital status: Married    Spouse name: [name]   Number of children: 4   Years of education: 14   Highest education level: Associate degree: academic program  Occupational History    Comment: Disability/retired.  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    Social Determinants of Health   Financial Resource Strain: Low Risk  (12/08/2020)   Overall Financial Resource Strain (CARDIA)    Difficulty of Paying Living Expenses: Not hard at all  Food Insecurity: No Food Insecurity (12/08/2020)   Hunger Vital Sign    Worried About Running  Out of Food in the Last Year: Never true    Ran Out of Food in the Last Year: Never true  Transportation Needs: No Transportation Needs (12/08/2020)   PRAPARE - Hydrologist (Medical): No    Lack of Transportation (Non-Medical): No  Physical Activity: Sufficiently Active (12/08/2020)   Exercise Vital Sign    Days of Exercise per Week: 7 days    Minutes of Exercise per Session: 120 min  Stress: No Stress Concern Present (12/08/2020)   [place]    Feeling of Stress : Not at all  Social Connections: Moderately Isolated (12/08/2020)   Social Connection and Isolation Panel [NHANES]    Frequency of Communication with Friends and Family: More than three times a week    Frequency of Social Gatherings with Friends and Family: More than three times a week    Attends Religious Services: Never    Marine scientist or Organizations: No    Attends Archivist Meetings: Never    Marital Status: Married  Human resources officer Violence: Not At Risk (12/08/2020)   Humiliation, Afraid, Rape, and Kick questionnaire    Fear of Current or Ex-Partner: No    Emotionally Abused: No    Physically Abused: No    Sexually Abused: No    Family History  Problem Relation Age of Onset   Hypertension Mother    Kidney disease Mother    Miscarriages / [place] Mother    Stroke Mother    Heart disease Father    Depression Sister    Kidney disease Sister    Diabetes Brother    Hypertension Brother    Hyperlipidemia Brother    Kidney disease Brother     Current Outpatient Medications  Medication Sig Dispense Refill   Accu-Chek Softclix Lancets lancets Use as instructed 100 each 12   acetaminophen (TYLENOL) 500 MG tablet Take 1,000 mg by mouth every 6 (six) hours as needed (pain.).     AMBULATORY NON FORMULARY MEDICATION Bedside commode 1 each 0   AMBULATORY NON FORMULARY MEDICATION Please provide 3 in 1  bedside commode for patient.  Diagnosis:  Post operative state Z98.890, Gait instability R26.81 1 Device 0   amLODipine (NORVASC) 5 MG tablet Take 1 tablet (5 mg total) by mouth daily. 90 tablet 3   aspirin EC 81 MG tablet Take 1 tablet (81 mg total) by mouth daily. Swallow whole.     atorvastatin (LIPITOR) 80 MG tablet Take 1 tablet (80 mg total) by mouth daily. 90 tablet 3   Blood Glucose Monitoring Suppl (ACCU-CHEK AVIVA PLUS) w/Device KIT USE DAILY TO MONITOR BLOOD GLUCOSE  1 kit 0   carvedilol (COREG) 12.5 MG tablet TAKE 1 TABLET(12.5 MG) BY MOUTH TWICE DAILY WITH A MEAL 180 tablet 1   esomeprazole (NEXIUM) 20 MG capsule Take 20 mg by mouth every morning. OTC     gabapentin (NEURONTIN) 300 MG capsule One tab PO qHS for a week, then BID for a week, then TID. May double weekly to a max of 3,649m/day 90 capsule 0   glucose blood (ACCU-CHEK AVIVA PLUS) test strip Use as instructed 300 strip 12   insulin glargine (LANTUS SOLOSTAR) 100 UNIT/ML Solostar Pen ADMINISTER 55 UNITS UNDER THE SKIN DAILY 51 mL 2   insulin lispro (HUMALOG KWIKPEN) 100 UNIT/ML KwikPen DIAL AND INJECT 15 UNITS UNDER THE SKIN 3 TIMES DAILY. 15 mL 3   lisinopril (ZESTRIL) 40 MG tablet Take 1 tablet (40 mg total) by mouth daily. 90 tablet 3   loratadine (CLARITIN) 10 MG tablet Take 10 mg by mouth in the morning.     Magnesium 250 MG TABS Take 250 mg by mouth in the morning.     meloxicam (MOBIC) 15 MG tablet Take 1 tablet (15 mg total) by mouth daily as needed for pain. 90 tablet 0   metFORMIN (GLUCOPHAGE-XR) 500 MG 24 hr tablet TAKE 2 TABLETS(1000 MG) BY MOUTH DAILY WITH BREAKFAST 180 tablet 1   Multiple Vitamins-Minerals (MULTIVITAMIN WITH MINERALS) tablet Take 1 tablet by mouth daily. Centrum 50+     No current facility-administered medications for this visit.    No Known Allergies   REVIEW OF SYSTEMS:  _0  denotes positive finding, _1  denotes negative finding Cardiac  Comments:  Chest pain or chest pressure:     Shortness of breath upon exertion:    Short of breath when lying flat:    Irregular heart rhythm:        Vascular    Pain in calf, thigh, or hip brought on by ambulation:    Pain in feet at night that wakes you up from your sleep:     Blood clot in your veins:    Leg swelling:         Pulmonary    Oxygen at home:    Productive cough:     Wheezing:         Neurologic    Sudden weakness in arms or legs:     Sudden numbness in arms or legs:     Sudden onset of difficulty speaking or slurred speech:    Temporary loss of vision in one eye:     Problems with dizziness:         Gastrointestinal    Blood in stool:     Vomited blood:         Genitourinary    Burning when urinating:     Blood in urine:        Psychiatric    Major depression:         Hematologic    Bleeding problems:    Problems with blood clotting too easily:        Skin    Rashes or ulcers:        Constitutional    Fever or chills:      PHYSICAL EXAMINATION:  Vitals:   08/13/22 1021  BP: (!) 142/83  Pulse: 83  Temp: (!) 97 F (36.1 C)  SpO2: 100%  Weight: 215 lb 12.8 oz (97.9 kg)  Height: _2  (1.778 m)    General:  WDWN in NAD; vital  signs documented above Gait: Normal  HENT: WNL, normocephalic Pulmonary: normal non-labored breathing  Cardiac: regular HR, without  Murmurs, without carotid bruit Abdomen: soft, ND Vascular Exam/Pulses:  Right Left  Radial 2+ (normal) 2+ (normal)  Femoral 2+ (normal) 2+ (normal)  Popliteal 2+ (normal) Not palpable  DP Not palpable Not palpable  PT Not palpable Not palpable  Brisk doppler DP/Pero signals bilaterally, monophasic faint PT signals Extremities: without ischemic changes, without Gangrene , without cellulitis; without open wounds;  Musculoskeletal: no muscle wasting or atrophy  Neurologic: A&O X 3;  No focal weakness or paresthesias are detected Psychiatric:  The pt has Normal affect.   Non-Invasive Vascular Imaging:    +-------+-----------+-----------+------------+------------+  ABI/TBIToday's ABIToday's TBIPrevious ABIPrevious TBI  +-------+-----------+-----------+------------+------------+  Right 1.16       0.48       0.92        0.40          +-------+-----------+-----------+------------+------------+  Left  1.11       0.50       0.91        0.46          +-------+-----------+-----------+------------+------------+   ASSESSMENT/PLAN:: 66 y.o. male here for follow up for PAD. He most recently underwent debridement of right medial calf wound and initiation of wound vac therapy 02/28/22 by Dr. Stanford Breed. Prior to this he underwent in situ right common femoral to peroneal bypass 01/02/22. His wound has completely healed. He has no new tissue loss. He has no rest pain or claudication.  - His ABI's today have improved bilaterally - Encourage him to continue a walking regimen - Continue Aspirin and statin - He will follow up in 6 months with ABI and RLE bypass graft duplex   Karoline Caldwell, PA-C [facility] [phone]  Clinic MD: Roxanne Mins

## 2022-08-14 ENCOUNTER — Encounter: Payer: Self-pay | Admitting: Podiatry

## 2022-08-14 ENCOUNTER — Ambulatory Visit: Payer: Medicare PPO | Admitting: Podiatry

## 2022-08-14 DIAGNOSIS — M79675 Pain in left toe(s): Secondary | ICD-10-CM

## 2022-08-14 DIAGNOSIS — M79674 Pain in right toe(s): Secondary | ICD-10-CM

## 2022-08-14 DIAGNOSIS — E1165 Type 2 diabetes mellitus with hyperglycemia: Secondary | ICD-10-CM | POA: Diagnosis not present

## 2022-08-14 DIAGNOSIS — B351 Tinea unguium: Secondary | ICD-10-CM

## 2022-08-14 DIAGNOSIS — Z794 Long term (current) use of insulin: Secondary | ICD-10-CM | POA: Diagnosis not present

## 2022-08-14 DIAGNOSIS — I739 Peripheral vascular disease, unspecified: Secondary | ICD-10-CM

## 2022-08-14 NOTE — Progress Notes (Signed)
This patient returns to my office for at risk foot care.  This patient requires this care by a professional since this patient will be at risk due to having diabetes and claudication.  This patient is unable to cut nails himself since the patient cannot reach his nails.These nails are painful walking and wearing shoes.  This patient presents for at risk foot care today.  General Appearance  Alert, conversant and in no acute stress.  Vascular  Dorsalis pedis and posterior tibial  pulses are palpable  bilaterally.  Capillary return is within normal limits  bilaterally. Temperature is within normal limits  bilaterally.  Neurologic  Senn-Weinstein monofilament wire test within normal limits  bilaterally. Muscle power within normal limits bilaterally.  Nails Thick disfigured discolored nails with subungual debris  from hallux to fifth toes bilaterally. No evidence of bacterial infection or drainage bilaterally.  Orthopedic  No limitations of motion  feet .  No crepitus or effusions noted.  No bony pathology or digital deformities noted.  Skin  normotropic skin with no porokeratosis noted bilaterally.  No signs of infections or ulcers noted.     Onychomycosis  Pain in right toes  Pain in left toes  Consent was obtained for treatment procedures.   Mechanical debridement of nails 1-5  bilaterally performed with a nail nipper.  Filed with dremel without incident.    Return office visit  3 months                    Told patient to return for periodic foot care and evaluation due to potential at risk complications.   Glee Lashomb DPM   

## 2022-08-15 ENCOUNTER — Encounter: Payer: Self-pay | Admitting: Family Medicine

## 2022-08-15 ENCOUNTER — Ambulatory Visit (INDEPENDENT_AMBULATORY_CARE_PROVIDER_SITE_OTHER): Payer: Medicare PPO | Admitting: Family Medicine

## 2022-08-15 VITALS — BP 131/77 | HR 85 | Ht 70.0 in | Wt 218.0 lb

## 2022-08-15 DIAGNOSIS — Z794 Long term (current) use of insulin: Secondary | ICD-10-CM

## 2022-08-15 DIAGNOSIS — I739 Peripheral vascular disease, unspecified: Secondary | ICD-10-CM

## 2022-08-15 DIAGNOSIS — I10 Essential (primary) hypertension: Secondary | ICD-10-CM

## 2022-08-15 DIAGNOSIS — E782 Mixed hyperlipidemia: Secondary | ICD-10-CM | POA: Diagnosis not present

## 2022-08-15 DIAGNOSIS — E1165 Type 2 diabetes mellitus with hyperglycemia: Secondary | ICD-10-CM

## 2022-08-15 LAB — POCT GLYCOSYLATED HEMOGLOBIN (HGB A1C): HbA1c, POC (controlled diabetic range): 8.1 % — AB (ref 0.0–7.0)

## 2022-08-15 LAB — POCT UA - MICROALBUMIN
Creatinine, POC: 50 mg/dL
Microalbumin Ur, POC: 80 mg/L

## 2022-08-15 NOTE — Assessment & Plan Note (Signed)
Continue atorvastatin at current strength.  

## 2022-08-15 NOTE — Progress Notes (Signed)
Erik Rose - 66 y.o. male MRN [id number]  Date of birth: 07/28/1956  Subjective Chief Complaint  Patient presents with   Diabetes    HPI Erik Rose is a 66 year old male here today for follow-up visit.  He reports he is feeling well at this time.  Has an upcoming trip to the [place] with his daughter.  He reports that he has made some changes to his diet since last visit.  Currently using 60 units of Lantus daily.  Additionally he is using 15 units of Humalog 3 times daily.  He checks his blood sugars occasionally.  He denies any symptoms of hypoglycemia.    Continues to see vascular surgery regularly.  His femoral to peroneal bypass graft has healed well.  He is walking more frequently.  He is not experiencing any significant claudication at this time.  He remains on atorvastatin, daily aspirin.  Blood pressures remain well controlled with combination of lisinopril, carvedilol and amlodipine.  He denies symptoms of hypotension.  He has not had chest pain, shortness of breath, palpitations, headaches, vision changes.    ROS:  A comprehensive ROS was completed and negative except as noted per HPI  Allergies  Allergen Reactions   Ozempic (0.25 Or 0.5 Mg-Dose) [Semaglutide(0.25 Or 0.5mg -Dos)]     Developed pancreatitis    Past Medical History:  Diagnosis Date   Anemia    Diabetes mellitus without complication (HCC)    Diverticulitis    GERD (gastroesophageal reflux disease)    Heart disease    History of kidney stones    passed   Hypertension    Myocardial infarction ([place])    "small one, blood numbers did not rise.   Peripheral vascular disease ([place])    Stroke ([place]) 2018   no residual effects   TIA (transient ischemic attack) 2017    Past Surgical History:  Procedure Laterality Date   ABDOMINAL AORTOGRAM W/LOWER EXTREMITY N/A 12/28/2021   Procedure: ABDOMINAL AORTOGRAM W/LOWER EXTREMITY;  Surgeon: Cherre Robins, MD;  Location: [facility];   Service: Cardiovascular;  Laterality: N/A;   APPLICATION OF WOUND VAC Right 02/28/2022   Procedure: APPLICATION OF WOUND VAC;  Surgeon: Cherre Robins, MD;  Location: [facility];  Service: Vascular;  Laterality: Right;   BYPASS GRAFT FEMORAL-PERONEAL Right 01/02/2022   Procedure: RIGHT FEMORAL-PERONEAL BYPASS GRAFT;  Surgeon: Cherre Robins, MD;  Location: [facility];  Service: Vascular;  Laterality: Right;   CARDIAC CATHETERIZATION     CORONARY ANGIOPLASTY WITH STENT PLACEMENT  2010   INCISION AND DRAINAGE OF WOUND Right 02/28/2022   Procedure: IRRIGATION AND DEBRIDEMENT RIGHT LEG WOUND;  Surgeon: Cherre Robins, MD;  Location[facility];  Service: Vascular;  Laterality: Right;   LEG ANGIOGRAPHY Right 01/02/2022   Procedure: LEG ANGIOGRAPHY;  Surgeon: Cherre Robins, MD;  [facility];  Service: Vascular;  Laterality: Right;    Social History   Socioeconomic History   Marital status: Married    Spouse name: [name]   Number of children: 4   Years of education: 14   Highest education level: Associate degree: academic program  Occupational History    Comment: Disability/retired.  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    Social Determinants of Health   Financial Resource Strain: Low Risk  (12/08/2020)   Overall Financial Resource Strain (CARDIA)    Difficulty of Paying Living Expenses: Not hard at all  Food Insecurity: No Food  Insecurity (12/08/2020)   Hunger Vital Sign    Worried About Running Out of Food in the Last Year: Never true    Ran Out of Food in the Last Year: Never true  Transportation Needs: No Transportation Needs (12/08/2020)   PRAPARE - Administrator, Civil Service (Medical): No    Lack of Transportation (Non-Medical): No  Physical Activity: Sufficiently Active (12/08/2020)   Exercise Vital Sign    Days of Exercise per Week: 7 days    Minutes of Exercise per Session: 120 min  Stress: No Stress Concern Present (12/08/2020)   [employer] of Occupational Health - Occupational Stress Questionnaire    Feeling of Stress : Not at all  Social Connections: Moderately Isolated (12/08/2020)   Social Connection and Isolation Panel [NHANES]    Frequency of Communication with Friends and Family: More than three times a week    Frequency of Social Gatherings with Friends and Family: More than three times a week    Attends Religious Services: Never    Database administrator or Organizations: No    Attends Engineer, structural: Never    Marital Status: Married    Family History  Problem Relation Age of Onset   Hypertension Mother    Kidney disease Mother    Miscarriages / [place] Mother    Stroke Mother    Heart disease Father    Depression Sister    Kidney disease Sister    Diabetes Brother    Hypertension Brother    Hyperlipidemia Brother    Kidney disease Brother     Health Maintenance  Topic Date Due   Lung Cancer Screening  11/07/2022 (Originally 05/24/2021)   Medicare Annual Wellness (AWV)  11/07/2022 (Originally 12/08/2021)   COVID-19 Vaccine (4 - Pfizer series) 12/01/2022 (Originally 11/18/2020)   INFLUENZA VACCINE  01/05/2023 (Originally 05/07/2022)   Hepatitis C Screening  01/31/2023 (Originally 08/13/1975)   COLONOSCOPY (Pts 45-47yrs Insurance coverage will need to be confirmed)  08/16/2023 (Originally 01/17/2019)   OPHTHALMOLOGY EXAM  08/27/2022   Diabetic kidney  evaluation - GFR measurement  01/16/2023   HEMOGLOBIN A1C  02/13/2023   FOOT EXAM  08/15/2023   Diabetic kidney evaluation - Urine ACR  08/16/2023   TETANUS/TDAP  07/19/2030   Pneumonia Vaccine 74+ Years old  Completed   HIV Screening  Completed   Zoster Vaccines- Shingrix  Completed   HPV VACCINES  Aged Out     ----------------------------------------------------------------------------------------------------------------------------------------------------------------------------------------------------------------- Physical Exam BP 131/77 (BP Location: Left Arm, Patient Position: Sitting, Cuff Size: Large)   Pulse 85   Ht 5\' 10"  (1.778 m)   Wt 218 lb (98.9 kg)   SpO2 100%   BMI 31.28 kg/m   Physical Exam Constitutional:      Appearance: Normal appearance.  HENT:  Head: Normocephalic and atraumatic.  Eyes:     General: No scleral icterus. Cardiovascular:     Rate and Rhythm: Normal rate and regular rhythm.  Pulmonary:     Effort: Pulmonary effort is normal.     Breath sounds: Normal breath sounds.  Musculoskeletal:     Cervical back: Neck supple.  Neurological:     Mental Status: He is alert.  Psychiatric:        Mood and Affect: Mood normal.        Behavior: Behavior normal.     ------------------------------------------------------------------------------------------------------------------------------------------------------------------------------------------------------------------- Assessment and Plan  Essential hypertension Blood pressure is well controlled at this time.  Recommend continuation of current medications for management of hypertension.  PAD (peripheral artery disease) (HCC) He is doing well since femoral to peroneal bypass.  This is healing well.  No claudication symptoms at this time.  Type 2 diabetes mellitus with hyperglycemia, with long-term current use of insulin (HCC) Diabetes is not well controlled.  Recommend further increase of  his Lantus to 65 units daily.  We may need to continue to titrate his Humalog as well and asked him to check his blood sugars regularly throughout the day.  Continue to work on dietary change.  Mixed hyperlipidemia Continue atorvastatin at current strength.   No orders of the defined types were placed in this encounter.   Return in about 3 months (around 11/15/2022) for T2DM.    This visit occurred during the SARS-CoV-2 public health emergency.  Safety protocols were in place, including screening questions prior to the visit, additional usage of staff PPE, and extensive cleaning of exam room while observing appropriate contact time as indicated for disinfecting solutions.

## 2022-08-15 NOTE — Assessment & Plan Note (Signed)
He is doing well since femoral to peroneal bypass.  This is healing well.  No claudication symptoms at this time.

## 2022-08-15 NOTE — Assessment & Plan Note (Signed)
Diabetes is not well controlled.  Recommend further increase of his Lantus to 65 units daily.  We may need to continue to titrate his Humalog as well and asked him to check his blood sugars regularly throughout the day.  Continue to work on dietary change.

## 2022-08-15 NOTE — Assessment & Plan Note (Signed)
Blood pressure is well-controlled at this time.  Recommend continuation of current medications for management of hypertension.   

## 2022-08-15 NOTE — Patient Instructions (Signed)
Increase lantus to 65 units daily.  Continue humalog at current strength  Work on dietary change and continue increased activity.  See me again in 3-4 months.

## 2022-08-20 ENCOUNTER — Other Ambulatory Visit: Payer: Self-pay

## 2022-08-20 DIAGNOSIS — I739 Peripheral vascular disease, unspecified: Secondary | ICD-10-CM
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
## 2022-09-01 ENCOUNTER — Other Ambulatory Visit: Payer: Self-pay | Admitting: Family Medicine

## 2022-09-01 DIAGNOSIS — M48061 Spinal stenosis, lumbar region without neurogenic claudication: Secondary | ICD-10-CM
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
## 2022-09-10 ENCOUNTER — Ambulatory Visit: Payer: Medicare PPO | Attending: Cardiology | Admitting: Cardiology

## 2022-09-10 ENCOUNTER — Encounter: Payer: Self-pay | Admitting: Cardiology

## 2022-09-10 VITALS — BP 132/72 | HR 79 | Ht 70.0 in | Wt 215.2 lb

## 2022-09-10 DIAGNOSIS — I10 Essential (primary) hypertension: Secondary | ICD-10-CM

## 2022-09-10 DIAGNOSIS — Z794 Long term (current) use of insulin: Secondary | ICD-10-CM

## 2022-09-10 DIAGNOSIS — E782 Mixed hyperlipidemia: Secondary | ICD-10-CM | POA: Diagnosis not present

## 2022-09-10 DIAGNOSIS — R9431 Abnormal electrocardiogram [ECG] [EKG]: Secondary | ICD-10-CM

## 2022-09-10 DIAGNOSIS — E1165 Type 2 diabetes mellitus with hyperglycemia: Secondary | ICD-10-CM

## 2022-09-10 DIAGNOSIS — Z720 Tobacco use: Secondary | ICD-10-CM | POA: Diagnosis not present

## 2022-09-10 DIAGNOSIS — I2510 Atherosclerotic heart disease of native coronary artery without angina pectoris: Secondary | ICD-10-CM

## 2022-09-10 DIAGNOSIS — I739 Peripheral vascular disease, unspecified: Secondary | ICD-10-CM

## 2022-09-10 LAB — LIPID PANEL
Chol/HDL Ratio: 2.8 ratio (ref 0.0–5.0)
Cholesterol, Total: 108 mg/dL (ref 100–199)
HDL: 38 mg/dL — ABNORMAL LOW (ref >39)
LDL Chol Calc (NIH): 57 mg/dL (ref 0–99)
Triglycerides: 60 mg/dL (ref 0–149)
VLDL Cholesterol Cal: 13 mg/dL (ref 5–40)

## 2022-09-10 LAB — BASIC METABOLIC PANEL
BUN/Creatinine Ratio: 17 (ref 10–24)
BUN: 17 mg/dL (ref 8–27)
CO2: 20 mmol/L (ref 20–29)
Calcium: 9.4 mg/dL (ref 8.6–10.2)
Chloride: 105 mmol/L (ref 96–106)
Creatinine, Ser: 0.99 mg/dL (ref 0.76–1.27)
Glucose: 136 mg/dL — ABNORMAL HIGH (ref 70–99)
Potassium: 4.4 mmol/L (ref 3.5–5.2)
Sodium: 139 mmol/L (ref 134–144)
eGFR: 85 mL/min/{1.73_m2} (ref >59)

## 2022-09-10 NOTE — Progress Notes (Signed)
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 the procedure.  Afterwards,  an IV will be started in your arm and electrodes will be placed on your skin for EKG monitoring during the stress portion of the exam. Then you will be escorted to the PET/CT scanner.  There, staff will get you positioned on the scanner and obtain a blood pressure and EKG.  During the exam, you will continue to be connected to the EKG and blood pressure machines.  A small, safe amount of a radioactive tracer will be injected in your IV to obtain a series of pictures of your heart along with an injection of a stress agent.    After your Exam:  It is  recommended that you eat a meal and drink a caffeinated beverage to counter act any effects of the stress agent.  Drink plenty of fluids for the remainder of the day and urinate frequently for the first couple of hours after the exam.  Your doctor will inform you of your test results within 7-10 business days.  For questions about your test or how to prepare for your test, please call: Marchia Bond, Cardiac Imaging Nurse Navigator  Gordy Clement, Cardiac Imaging Nurse Navigator Office: [phone]          I,Rachel Rivera,acting as a scribe for Freada Bergeron, MD.,have documented all relevant documentation on the behalf of Freada Bergeron, MD,as directed by  Freada Bergeron, MD while in the presence of Freada Bergeron, MD.  I, Freada Bergeron, MD, have reviewed all documentation for this visit. The documentation on 09/10/22 for the exam, diagnosis, procedures, and orders are all accurate and complete.   Signed, Freada Bergeron, MD  09/10/2022 10:39 AM    [facility]

## 2022-09-10 NOTE — Patient Instructions (Addendum)
Medication Instructions:  Your physician recommends that you continue on your current medications as directed. Please refer to the Current Medication list given to you today.  *If you need a refill on your cardiac medications before your next appointment, please call your pharmacy*   Lab Work: BMET, Lipids If you have labs (blood work) drawn today and your tests are completely normal, you will receive your results only by: MyChart Message (if you have MyChart) OR A paper copy in the mail If you have any lab test that is abnormal or we need to change your treatment, we will call you to review the results.   Testing/Procedures: Your physician has requested that you have an echocardiogram. Echocardiography is a painless test that uses sound waves to create images of your heart. It provides your doctor with information about the size and shape of your heart and how well your heart's chambers and valves are working. This procedure takes approximately one hour. There are no restrictions for this procedure. Please do NOT wear cologne, perfume, aftershave, or lotions (deodorant is allowed). Please arrive 15 minutes prior to your appointment time.    Cardiac Pet Scan- see below    Follow-Up: At [facility], you and your health needs are our priority.  As part of our continuing mission to provide you with exceptional heart care, we have created designated Provider Care Teams.  These Care Teams include your primary Cardiologist (physician) and Advanced Practice Providers (APPs -  Physician Assistants and Nurse Practitioners) who all work together to provide you with the care you need, when you need it.  Your next appointment:   6 month(s)  The format for your next appointment:   In Person  Provider:   Meriam Sprague, MD     Other Instructions How to Prepare for Your Cardiac PET/CT Stress Test:  1. Please do not take these medications before your test:   Medications that  may interfere with the cardiac pharmacological stress agent (ex. nitrates - including erectile dysfunction medications or beta-blockers) the day of the exam. (Erectile dysfunction medication should be held for at least 72 hrs prior to test) Theophylline containing medications for 12 hours. Dipyridamole 48 hours prior to the test. Your remaining medications may be taken with water.  2. Nothing to eat or drink, except water, 3 hours prior to arrival time.   NO caffeine/decaffeinated products, or chocolate 12 hours prior to arrival.  3. NO perfume, cologne or lotion  4. Total time is 1 to 2 hours; you may want to bring reading material for the waiting time.  5. Please report to Admitting at the [facility] Main Entrance 60 minutes early for your test.  [address] [place], [place] [address]  Diabetic Preparation:  Hold oral medications. You may take NPH and Lantus insulin. Do not take Humalog or Humulin R (Regular Insulin) the day of your test. Check blood sugars prior to leaving the house. If able to eat breakfast prior to 3 hour fasting, you may take all medications, including your insulin, Do not worry if you miss your breakfast dose of insulin - start at your next meal.  IF YOU THINK YOU MAY BE PREGNANT, OR ARE NURSING PLEASE INFORM THE TECHNOLOGIST.  In preparation for your appointment, medication and supplies will be purchased.  Appointment availability is limited, so if you need to cancel or reschedule, please call the Radiology Department at [phone]  24 hours in advance to avoid a cancellation fee  of $100.00  What to Expect After you Arrive:  Once you arrive and check in for your appointment, you will be taken to a preparation room within the Radiology Department.  A technologist or Nurse will obtain your medical history, verify that you are correctly prepped for the exam, and explain the procedure.  Afterwards,  an IV will be started in your arm and electrodes  will be placed on your skin for EKG monitoring during the stress portion of the exam. Then you will be escorted to the PET/CT scanner.  There, staff will get you positioned on the scanner and obtain a blood pressure and EKG.  During the exam, you will continue to be connected to the EKG and blood pressure machines.  A small, safe amount of a radioactive tracer will be injected in your IV to obtain a series of pictures of your heart along with an injection of a stress agent.    After your Exam:  It is recommended that you eat a meal and drink a caffeinated beverage to counter act any effects of the stress agent.  Drink plenty of fluids for the remainder of the day and urinate frequently for the first couple of hours after the exam.  Your doctor will inform you of your test results within 7-10 business days.  For questions about your test or how to prepare for your test, please call: Marchia Bond, Cardiac Imaging Nurse Navigator  Gordy Clement, Cardiac Imaging Nurse Navigator Office: [phone]

## 2022-09-12 ENCOUNTER — Telehealth: Payer: Self-pay

## 2022-09-12 NOTE — Telephone Encounter (Signed)
PAP shipment received for Lantus (3) pens.   LOT# [id number] EXP: 2024-09-05 Product # [id number]  Left a detailed vm msg for patient notifying them that the Lantus rx is available for pick up. Hours/scheduled provided with direct call back info.

## 2022-09-23 NOTE — Telephone Encounter (Signed)
Mr. Broadfoot came to pick up medication Lantus 3 pens confirmed and verified LOT#.

## 2022-10-04 ENCOUNTER — Ambulatory Visit (HOSPITAL_COMMUNITY): Payer: Medicare PPO | Attending: Internal Medicine

## 2022-10-04 DIAGNOSIS — E782 Mixed hyperlipidemia: Secondary | ICD-10-CM | POA: Diagnosis not present

## 2022-10-04 DIAGNOSIS — I2510 Atherosclerotic heart disease of native coronary artery without angina pectoris: Secondary | ICD-10-CM | POA: Diagnosis not present

## 2022-10-04 LAB — ECHOCARDIOGRAM COMPLETE
AR max vel: 2.78 cm2
AV Area VTI: 2.89 cm2
AV Area mean vel: 2.81 cm2
AV Mean grad: 4 mmHg
AV Peak grad: 6.9 mmHg
Ao pk vel: 1.31 m/s
Area-P 1/2: 3.85 cm2
Calc EF: 54.6 %
S' Lateral: 2.9 cm
Single Plane A2C EF: 51 %
Single Plane A4C EF: 56.6 %

## 2022-10-11 ENCOUNTER — Telehealth (HOSPITAL_COMMUNITY): Payer: Self-pay | Admitting: Emergency Medicine

## 2022-10-11 DIAGNOSIS — R072 Precordial pain: Secondary | ICD-10-CM

## 2022-10-11 NOTE — Telephone Encounter (Signed)
PET attestation

## 2022-10-14 ENCOUNTER — Telehealth (HOSPITAL_COMMUNITY): Payer: Self-pay | Admitting: Emergency Medicine

## 2022-10-14 NOTE — Telephone Encounter (Signed)
Reaching out to patient to offer assistance regarding upcoming cardiac imaging study; pt verbalizes understanding of appt date/time, parking situation and where to check in, pre-test NPO status and medications ordered, and verified current allergies; name and call back number provided for further questions should they arise Erik Bond RN Navigator Cardiac Imaging [facility] [phone] office [phone] cell  Arrival [address] Denies iv issues No caffine No food Hold carvedilol

## 2022-10-15 ENCOUNTER — Ambulatory Visit (HOSPITAL_COMMUNITY)
Admission: RE | Admit: 2022-10-15 | Discharge: 2022-10-15 | Disposition: A | Discharge status: Home / Self Care | Payer: Medicare HMO | Source: Ambulatory Visit | Attending: Cardiology | Admitting: Cardiology

## 2022-10-15 DIAGNOSIS — E782 Mixed hyperlipidemia: Secondary | ICD-10-CM | POA: Insufficient documentation

## 2022-10-15 DIAGNOSIS — I2510 Atherosclerotic heart disease of native coronary artery without angina pectoris: Secondary | ICD-10-CM | POA: Diagnosis not present

## 2022-10-15 MED ORDER — RUBIDIUM RB82 GENERATOR (RUBYFILL)
24.9400 | PACK | Freq: Once | INTRAVENOUS | Status: AC
  Administered 2022-10-15: 24.94 via INTRAVENOUS

## 2022-10-15 MED ORDER — RUBIDIUM RB82 GENERATOR (RUBYFILL)
24.6300 | PACK | Freq: Once | INTRAVENOUS | Status: AC
  Administered 2022-10-15: 24.63 via INTRAVENOUS

## 2022-10-15 MED ORDER — REGADENOSON 0.4 MG/5ML IV SOLN
0.4000 mg | Freq: Once | INTRAVENOUS | Status: AC
  Administered 2022-10-15: 0.4 mg via INTRAVENOUS

## 2022-10-15 MED ORDER — REGADENOSON 0.4 MG/5ML IV SOLN
INTRAVENOUS | Status: AC
  Filled 2022-10-15: qty 5

## 2022-10-16 LAB — NM PET CT CARDIAC PERFUSION MULTI W/ABSOLUTE BLOODFLOW
LV dias vol: 140 mL (ref 62–150)
LV sys vol: 69 mL
MBFR: 1.78
Nuc Rest EF: 47 %
Nuc Stress EF: 51 %
Rest MBF: 0.85 ml/g/min
Rest Nuclear Isotope Dose: 25 mCi
ST Depression (mm): 0 mm
Stress MBF: 1.51 ml/g/min
Stress Nuclear Isotope Dose: 25 mCi
TID: 1.08

## 2022-10-22 ENCOUNTER — Other Ambulatory Visit: Payer: Self-pay | Admitting: Family Medicine

## 2022-10-28 ENCOUNTER — Other Ambulatory Visit: Payer: Self-pay | Admitting: Family Medicine

## 2022-10-29 DIAGNOSIS — M5030 Other cervical disc degeneration, unspecified cervical region: Secondary | ICD-10-CM | POA: Diagnosis not present

## 2022-10-29 DIAGNOSIS — F17210 Nicotine dependence, cigarettes, uncomplicated: Secondary | ICD-10-CM | POA: Diagnosis not present

## 2022-10-29 DIAGNOSIS — I10 Essential (primary) hypertension: Secondary | ICD-10-CM | POA: Diagnosis not present

## 2022-10-29 DIAGNOSIS — E669 Obesity, unspecified: Secondary | ICD-10-CM | POA: Diagnosis not present

## 2022-10-29 DIAGNOSIS — E785 Hyperlipidemia, unspecified: Secondary | ICD-10-CM | POA: Diagnosis not present

## 2022-10-29 DIAGNOSIS — Z0000 Encounter for general adult medical examination without abnormal findings: Secondary | ICD-10-CM | POA: Diagnosis not present

## 2022-10-29 DIAGNOSIS — I252 Old myocardial infarction: Secondary | ICD-10-CM | POA: Diagnosis not present

## 2022-10-29 DIAGNOSIS — E119 Type 2 diabetes mellitus without complications: Secondary | ICD-10-CM | POA: Diagnosis not present

## 2022-10-29 DIAGNOSIS — K219 Gastro-esophageal reflux disease without esophagitis: Secondary | ICD-10-CM | POA: Diagnosis not present

## 2022-10-31 ENCOUNTER — Telehealth (INDEPENDENT_AMBULATORY_CARE_PROVIDER_SITE_OTHER): Payer: Medicare HMO | Admitting: Medical-Surgical

## 2022-10-31 ENCOUNTER — Encounter: Payer: Self-pay | Admitting: Medical-Surgical

## 2022-10-31 ENCOUNTER — Other Ambulatory Visit: Payer: Self-pay

## 2022-10-31 DIAGNOSIS — U071 COVID-19: Secondary | ICD-10-CM | POA: Diagnosis not present

## 2022-10-31 MED ORDER — CARVEDILOL 12.5 MG PO TABS: ORAL_TABLET | ORAL | 1 refills | Status: DC

## 2022-10-31 NOTE — Progress Notes (Signed)
Virtual Visit via Video Note  I connected with Erik Rose on 10/31/22 at 11:10 AM EST by a video enabled telemedicine application and verified that I am speaking with the correct person using two identifiers.   I discussed the limitations of evaluation and management by telemedicine and the availability of in person appointments. The patient expressed understanding and agreed to proceed.  Patient location: home Provider locations: office  Subjective:    CC: COVID-positive  HPI: Pleasant 67 year old male presenting via MyChart video visit with reports of symptoms that started on Monday night including rhinorrhea, fatigue, headache, reduced appetite.  Symptoms have continued with slight improvement.  He tested for COVID yesterday with positive results.  Has been using Alka-Seltzer cold plus and TheraFlu for symptom management with mild to moderate relief of symptoms.  No fevers, chills, chest pain, shortness of breath, or GI side effects.  Feels like he just wants to sleep a lot.  Able to eat and drink without difficulty.  Past medical history, Surgical history, Family history not pertinant except as noted below, Social history, Allergies, and medications have been entered into the medical record, reviewed, and corrections made.   Review of Systems: See HPI for pertinent positives and negatives.   Objective:    General: Speaking clearly in complete sentences without any shortness of breath.  Alert and oriented x3.  Normal judgment. No apparent acute distress.  Impression and Recommendations:    1. COVID-19 virus infection Discussed the use of oral antivirals.  He had this previously and reports that it left a foul taste in his mouth which was extremely unpleasant.  With a mild nature of his symptoms, feel that he will do fine without the antiviral.  He agrees and declined the order today.  Continue using conservative measures and over-the-counter cold and flu preparations for  symptom management.  Push fluids and increase rest.  Advised to monitor blood pressure when using over-the-counter cough and cold preparations as these can worsen hypertension.  May benefit from getting a formula that is made for patients with high blood pressure.  I discussed the assessment and treatment plan with the patient. The patient was provided an opportunity to ask questions and all were answered. The patient agreed with the plan and demonstrated an understanding of the instructions.   The patient was advised to call back or seek an in-person evaluation if the symptoms worsen or if the condition fails to improve as anticipated.  25 minutes of non-face-to-face time was provided during this encounter.  Return if symptoms worsen or fail to improve.  Clearnce Sorrel, DNP, APRN, FNP-BC [facility]

## 2022-11-14 ENCOUNTER — Other Ambulatory Visit: Payer: Self-pay | Admitting: Pharmacist

## 2022-11-14 NOTE — Progress Notes (Signed)
Patient appearing on report for [employer] - Hypertension Control report due to last documented ambulatory blood pressure of 153/82 on 10/15/22. Next appointment with PCP is 11/15/22   Outreached patient to discuss hypertension control and medication management. Patient did not answer, we have a scheduled phone appt upcoming in March.  Larinda Buttery, PharmD Clinical Pharmacist [facility] [phone]

## 2022-11-15 ENCOUNTER — Ambulatory Visit (INDEPENDENT_AMBULATORY_CARE_PROVIDER_SITE_OTHER): Payer: Medicare HMO | Admitting: Family Medicine

## 2022-11-15 ENCOUNTER — Encounter: Payer: Self-pay | Admitting: Family Medicine

## 2022-11-15 VITALS — BP 131/75 | HR 90 | Ht 70.0 in | Wt 214.0 lb

## 2022-11-15 DIAGNOSIS — Z794 Long term (current) use of insulin: Secondary | ICD-10-CM | POA: Diagnosis not present

## 2022-11-15 DIAGNOSIS — E1165 Type 2 diabetes mellitus with hyperglycemia: Secondary | ICD-10-CM | POA: Diagnosis not present

## 2022-11-15 DIAGNOSIS — E782 Mixed hyperlipidemia: Secondary | ICD-10-CM | POA: Diagnosis not present

## 2022-11-15 DIAGNOSIS — I10 Essential (primary) hypertension: Secondary | ICD-10-CM

## 2022-11-15 DIAGNOSIS — F17210 Nicotine dependence, cigarettes, uncomplicated: Secondary | ICD-10-CM

## 2022-11-15 LAB — POCT GLYCOSYLATED HEMOGLOBIN (HGB A1C): HbA1c, POC (controlled diabetic range): 8.1 % — AB (ref 0.0–7.0)

## 2022-11-15 MED ORDER — LANTUS SOLOSTAR 100 UNIT/ML ~~LOC~~ SOPN: PEN_INJECTOR | SUBCUTANEOUS | 2 refills | Status: DC

## 2022-11-15 NOTE — Progress Notes (Unsigned)
Erik Rose - 67 y.o. male MRN [id number]  Date of birth: [birth year]  Subjective Chief Complaint  Patient presents with   Diabetes   Hypertension    HPI Erik Rose  is a 67 year old male here today for follow-up visit.  Reports he is feeling well.  Blood pressure is well-controlled at this time.  Continues on combination of lisinopril, carvedilol and amlodipine.  No side effects noted with current medications.  He denies chest pain, shortness of breath, palpitations, headaches or vision changes.  A1c remains elevated at 8.1%.  Similar to previous reading.  He is using medications as directed.  Unable to tolerate GLP-1's due to history of pancreatitis while taking these.  He does not monitor his blood sugars at home regularly.  Continues to tolerate atorvastatin well at current strength.  ROS:  A comprehensive ROS was completed and negative except as noted per HPI       Allergies  Allergen Reactions   Ozempic (0.25 Or 0.5 Mg-Dose) [Semaglutide(0.25 Or 0.84m-Dos)]     Developed pancreatitis    Past Medical History:  Diagnosis Date   Anemia    Diabetes mellitus without complication (HCC)    Diverticulitis    GERD (gastroesophageal reflux disease)    Heart disease    History of kidney stones    passed   Hypertension    Myocardial infarction ([facility]    "small one, blood numbers did not rise.   Peripheral vascular disease ([place]    [place]   no residual effects   TIA (transient ischemic attack) 2017    Past Surgical History:  Procedure Laterality Date   ABDOMINAL AORTOGRAM W/LOWER EXTREMITY N/A 12/28/2021   Procedure: ABDOMINAL AORTOGRAM W/LOWER EXTREMITY;  Surgeon: HCherre Robins MD;  [facility];  Service: Cardiovascular;  Laterality: N/A;   APPLICATION OF WOUND VAC Right 02/28/2022   Procedure: APPLICATION OF WOUND VAC;  Surgeon: HCherre Robins MD;  [place] [facility]  Service: Vascular;  Laterality: Right;   BYPASS  GRAFT FEMORAL-PERONEAL Right 01/02/2022   Procedure: RIGHT FEMORAL-PERONEAL BYPASS GRAFT;  Surgeon: HCherre Robins MD;  [place] [facility]  Service: Vascular;  Laterality: Right;   CARDIAC CATHETERIZATION     CORONARY ANGIOPLASTY WITH STENT PLACEMENT  2010   INCISION AND DRAINAGE OF WOUND Right 02/28/2022   Procedure: IRRIGATION AND DEBRIDEMENT RIGHT LEG WOUND;  Surgeon: HCherre Robins MD;  [facility];  Service: Vascular;  Laterality: Right;   LEG ANGIOGRAPHY Right 01/02/2022   Procedure: LEG ANGIOGRAPHY;  Surgeon: HCherre Robins MD;  Location: [facility];  Service: Vascular;  Laterality: Right;    Social History   Socioeconomic History   Marital status: Married    Spouse name: LVaughan Basta  Number of children: 4   Years of education: 14   Highest education level: Associate degree: academic program  Occupational History    Comment: Disability/retired.  Tobacco Use   Smoking status: Every Day    Packs/day: 0.50    Years: 38.00    Total pack years: 19.00    Types: Cigarettes    Passive exposure: Never   Smokeless tobacco: Never   Tobacco comments:    5-8 cigarettes a day  Vaping Use   Vaping Use: Never used  Substance and Sexual Activity   Alcohol use: Yes    Comment: occasional   Drug use: Not Currently    Types: Marijuana, Cocaine    Comment: stopped 1991   Sexual activity:  Yes    Partners: Female  Other Topics Concern   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    Social Determinants of Health   Financial Resource Strain: Low Risk  (12/08/2020)   Overall Financial Resource Strain (CARDIA)    Difficulty of Paying Living Expenses: Not hard at all  Food Insecurity: No Food Insecurity (12/08/2020)   Hunger Vital Sign    Worried About Running Out of Food in the Last Year: Never true    Ran Out of Food in the Last Year: Never true  Transportation Needs: No Transportation Needs (12/08/2020)   PRAPARE -  Hydrologist (Medical): No    Lack of Transportation (Non-Medical): No  Physical Activity: Sufficiently Active (12/08/2020)   Exercise Vital Sign    Days of Exercise per Week: 7 days    Minutes of Exercise per Session: 120 min  Stress: No Stress Concern Present (12/08/2020)   [place]    Feeling of Stress : Not at all  Social Connections: Moderately Isolated (12/08/2020)   Social Connection and Isolation Panel [NHANES]    Frequency of Communication with Friends and Family: More than three times a week    Frequency of Social Gatherings with Friends and Family: More than three times a week    Attends Religious Services: Never    Marine scientist or Organizations: No    Attends Music therapist: Never    Marital Status: Married    Family History  Problem Relation Age of Onset   Hypertension Mother    Kidney disease Mother    Miscarriages / [place] Mother    Stroke Mother    Heart disease Father    Depression Sister    Kidney disease Sister    Diabetes Brother    Hypertension Brother    Hyperlipidemia Brother    Kidney disease Brother     Health Maintenance  Topic Date Due   COVID-19 Vaccine (4 - 2023-24 season) 06/07/2022   Medicare Annual Wellness (AWV)  12/14/2022 (Originally 12/08/2021)   INFLUENZA VACCINE  01/05/2023 (Originally 05/07/2022)   Hepatitis C Screening  01/31/2023 (Originally 08/13/1974)   Lung Cancer Screening  02/13/2023 (Originally 05/24/2021)   OPHTHALMOLOGY EXAM  02/13/2023 (Originally 08/27/2022)   COLONOSCOPY (Pts 45-40yr Insurance coverage will need to be confirmed)  08/16/2023 (Originally 01/17/2019)   HEMOGLOBIN A1C  05/16/2023   FOOT EXAM  08/15/2023   Diabetic kidney evaluation - Urine ACR  08/16/2023   Diabetic kidney evaluation - eGFR measurement  09/11/2023   DTaP/Tdap/Td (2 - Td or Tdap) 07/19/2030   Pneumonia Vaccine 67 Years old   Completed   HIV Screening  Completed   Zoster Vaccines- Shingrix  Completed   HPV VACCINES  Aged Out     ----------------------------------------------------------------------------------------------------------------------------------------------------------------------------------------------------------------- Physical Exam BP 131/75 (BP Location: Left Arm, Patient Position: Sitting, Cuff Size: Large)   Pulse 90   Ht 5' 10"$  (1.778 m)   Wt 214 lb (97.1 kg)   SpO2 100%   BMI 30.71 kg/m   Physical Exam Constitutional:      Appearance: Normal appearance.  HENT:     Head: Normocephalic and atraumatic.  Eyes:     General: No scleral icterus. Pulmonary:     Effort: Pulmonary effort is normal.     Breath sounds: Normal breath sounds.  Musculoskeletal:  General: Normal range of motion.     Cervical back: Neck supple.  Neurological:     Mental Status: He is alert.  Psychiatric:        Mood and Affect: Mood normal.        Behavior: Behavior normal.     ------------------------------------------------------------------------------------------------------------------------------------------------------------------------------------------------------------------- Assessment and Plan  Essential hypertension Blood pressure is well-controlled.  Recommend continuation of current medications for management of hypertension.  Type 2 diabetes mellitus with hyperglycemia, with long-term current use of insulin (HCC) Diabetes remains uncontrolled.  Recommend increasing insulin to 35 units every morning and 35 units every afternoon.  Encourage dietary changes.  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  Recommend continuation of atorvastatin at current strength. Lab Results  Component Value Date   LDLCALC 57 09/10/2022     Smoking greater than 20 pack years Continue to work on quitting.  Counseling given.  Meds ordered this encounter  Medications    insulin glargine (LANTUS SOLOSTAR) 100 UNIT/ML Solostar Pen    Sig: Administer 35 units twice per day.    Dispense:  51 mL    Refill:  2    Return in about 4 months (around 03/16/2023) for T2DM.    This visit occurred during the SARS-CoV-2 public health emergency.  Safety protocols were in place, including screening questions prior to the visit, additional usage of staff PPE, and extensive cleaning of exam room while observing appropriate contact time as indicated for disinfecting solutions.

## 2022-11-15 NOTE — Patient Instructions (Signed)
Change Lantus to 35 units twice per day (70 units total)

## 2022-11-17 LAB — HM DIABETES EYE EXAM

## 2022-11-17 NOTE — Assessment & Plan Note (Signed)
Blood pressure is well-controlled.  Recommend continuation of current medications for management of hypertension.

## 2022-11-17 NOTE — Assessment & Plan Note (Signed)
Continue to work on quitting.  Counseling given.

## 2022-11-17 NOTE — Assessment & Plan Note (Signed)
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  Recommend continuation of atorvastatin at current strength. Lab Results  Component Value Date   LDLCALC 57 09/10/2022

## 2022-11-17 NOTE — Assessment & Plan Note (Signed)
Diabetes remains uncontrolled.  Recommend increasing insulin to 35 units every morning and 35 units every afternoon.  Encourage dietary changes.

## 2022-11-20 ENCOUNTER — Ambulatory Visit: Payer: Medicare HMO | Admitting: Podiatry

## 2022-11-20 VITALS — BP 138/76 | HR 82

## 2022-11-20 DIAGNOSIS — E1165 Type 2 diabetes mellitus with hyperglycemia: Secondary | ICD-10-CM

## 2022-11-20 DIAGNOSIS — M79675 Pain in left toe(s): Secondary | ICD-10-CM | POA: Diagnosis not present

## 2022-11-20 DIAGNOSIS — M79674 Pain in right toe(s): Secondary | ICD-10-CM

## 2022-11-20 DIAGNOSIS — B351 Tinea unguium: Secondary | ICD-10-CM

## 2022-11-20 DIAGNOSIS — Z794 Long term (current) use of insulin: Secondary | ICD-10-CM | POA: Diagnosis not present

## 2022-11-20 NOTE — Progress Notes (Signed)
This patient returns to my office for at risk foot care.  This patient requires this care by a professional since this patient will be at risk due to having diabetes and claudication.  This patient is unable to cut nails himself since the patient cannot reach his nails.These nails are painful walking and wearing shoes.  This patient presents for at risk foot care today.  General Appearance  Alert, conversant and in no acute stress.  Vascular  Dorsalis pedis and posterior tibial  pulses are palpable  bilaterally.  Capillary return is within normal limits  bilaterally. Temperature is within normal limits  bilaterally.  Neurologic  Senn-Weinstein monofilament wire test within normal limits  bilaterally. Muscle power within normal limits bilaterally.  Nails Thick disfigured discolored nails with subungual debris  from hallux to fifth toes bilaterally. No evidence of bacterial infection or drainage bilaterally.  Orthopedic  No limitations of motion  feet .  No crepitus or effusions noted.  No bony pathology or digital deformities noted.  Skin  normotropic skin with no porokeratosis noted bilaterally.  No signs of infections or ulcers noted.     Onychomycosis  Pain in right toes  Pain in left toes  Consent was obtained for treatment procedures.   Mechanical debridement of nails 1-5  bilaterally performed with a nail nipper.  Filed with dremel without incident.    Return office visit  3 months                    Told patient to return for periodic foot care and evaluation due to potential at risk complications.   Gardiner Barefoot DPM

## 2022-11-27 ENCOUNTER — Encounter: Payer: Self-pay | Admitting: Family Medicine

## 2022-12-02 ENCOUNTER — Encounter: Payer: Self-pay | Admitting: Family Medicine

## 2022-12-04 ENCOUNTER — Other Ambulatory Visit: Payer: Self-pay

## 2022-12-04 DIAGNOSIS — E1165 Type 2 diabetes mellitus with hyperglycemia: Secondary | ICD-10-CM

## 2022-12-04 MED ORDER — LANTUS SOLOSTAR 100 UNIT/ML ~~LOC~~ SOPN: PEN_INJECTOR | SUBCUTANEOUS | 2 refills | Status: DC

## 2022-12-07 ENCOUNTER — Other Ambulatory Visit: Payer: Self-pay | Admitting: Family Medicine

## 2022-12-07 DIAGNOSIS — M48061 Spinal stenosis, lumbar region without neurogenic claudication: Secondary | ICD-10-CM

## 2022-12-17 ENCOUNTER — Telehealth: Payer: Medicare PPO

## 2022-12-17 ENCOUNTER — Other Ambulatory Visit: Payer: Medicare HMO | Admitting: Pharmacist

## 2022-12-17 NOTE — Progress Notes (Signed)
12/17/2022 Name: Erik Rose MRN: [id number] DOB: 03-Aug-1956  Chief Complaint  Patient presents with   Diabetes   Medication Assistance   Hypertension    Erik Rose is a 67 y.o. year old male who presented for a telephone visit.   They were referred to the pharmacist by their PCP for assistance in managing diabetes and hypertension.   Subjective:  Care Team: Primary Care Provider: [name], DO ; Next Scheduled Visit: 03/17/23  Medication Access/Adherence  Current Pharmacy:  [address] [facility] [place], [address] [place] [address] Phone: [phone] Fax: [phone]    Diabetes:  Current medications: lantus 35 units BID, humalog 15 units TID, metformin XR '1000mg'$  daily   Current glucose readings:  AM: 114, 129, 118  Using traditional  meter; testing 1 times daily. He is not interested in CGM at this time.   Patient denies hypoglycemic s/sx including dizziness, shakiness, sweating. Patient denies hyperglycemic symptoms including polyuria, polydipsia, polyphagia, nocturia, neuropathy, blurred vision.  Current medication access support: Lantus via [employer] patient assistance   Hypertension:  Current medications: lisinopril '40mg'$  daily, carvedilol 12.'5mg'$  BID, amlodipine '5mg'$  daily Medications previously tried:   Patient has a validated, automated, upper arm home BP cuff Current blood pressure readings readings: 120/73, 110/70, 130/75  Patient denies hypotensive s/sx including dizziness, lightheadedness.  Patient denies hypertensive symptoms including headache, chest pain, shortness of breath    Objective:  Lab Results  Component Value Date   HGBA1C 8.1 (A) 11/15/2022    Lab Results  Component Value Date   CREATININE 0.99 09/10/2022   BUN 17 09/10/2022   NA 139 09/10/2022   K 4.4 09/10/2022   CL 105 09/10/2022   CO2 20 09/10/2022    Lab  Results  Component Value Date   CHOL 108 09/10/2022   HDL 38 (L) 09/10/2022   LDLCALC 57 09/10/2022   LDLDIRECT 112.0 09/09/2018   TRIG 60 09/10/2022   CHOLHDL 2.8 09/10/2022    Medications Reviewed Today     Reviewed by [name], [place] (Pharmacist) on 12/17/22 at 1203  Med List Status: <None>   Medication Order Taking? Sig Documenting Provider Last Dose Status Informant  Accu-Chek Softclix Lancets lancets [id number]  Use as instructed [name], DO  Active   acetaminophen (TYLENOL) 500 MG tablet [id number]  Take 1,000 mg by mouth every 6 (six) hours as needed (pain.). [provider]  Active Self  amLODipine (NORVASC) 5 MG tablet [id number] Yes Take 1 tablet (5 mg total) by mouth daily. [name], DO Taking Active Self  aspirin EC 81 MG tablet [id number]  Take 1 tablet (81 mg total) by mouth daily. Swallow whole. Freada Bergeron, MD  Active Self  atorvastatin (LIPITOR) 80 MG tablet [id number]  Take 1 tablet (80 mg total) by mouth daily. [name], DO  Active   Blood Glucose Monitoring Suppl (ACCU-CHEK AVIVA PLUS) w/Device KIT [id number] Yes USE DAILY TO MONITOR BLOOD GLUCOSE [name], DO Taking Active   carvedilol (COREG) 12.5 MG tablet [id number] Yes TAKE 1 TABLET(12.5 MG) BY MOUTH TWICE DAILY WITH A MEAL [name], DO Taking Active   esomeprazole (NEXIUM) 20 MG capsule [id number]  Take 20 mg by mouth every morning. OTC [provider]  Active Self  gabapentin (NEURONTIN) 300 MG capsule [id number]  TAKE 1 CAPSULE BY MOUTH THREE TIMES DAILY [name], DO  Active  glucose blood (ACCU-CHEK AVIVA PLUS) test strip [id number] Yes Use as instructed [name], DO Taking Active   insulin glargine (LANTUS SOLOSTAR) 100 UNIT/ML Solostar Pen [id number] Yes Administer 35 units twice per day. [name], DO Taking Active   insulin lispro (HUMALOG KWIKPEN) 100 UNIT/ML KwikPen [id number] Yes DIAL AND INJECT 15 UNITS UNDER THE SKIN 3 TIMES DAILY.  [name], DO Taking Active Self  lisinopril (ZESTRIL) 40 MG tablet [id number] Yes Take 1 tablet (40 mg total) by mouth daily. Freada Bergeron, MD Taking Active   loratadine (CLARITIN) 10 MG tablet [id number]  Take 10 mg by mouth in the morning. [provider]  Active Self  meloxicam (MOBIC) 15 MG tablet [id number]  TAKE 1 TABLET(15 MG) BY MOUTH DAILY AS NEEDED FOR PAIN [name], DO  Active   metFORMIN (GLUCOPHAGE-XR) 500 MG 24 hr tablet [id number]  TAKE 2 TABLETS(1000 MG) BY MOUTH DAILY WITH BREAKFAST [name], DO  Active   Multiple Vitamins-Minerals (MULTIVITAMIN WITH MINERALS) tablet [id number]  Take 1 tablet by mouth daily. Centrum 50+ [provider]  Active Self              Assessment/Plan:   Diabetes: - Currently uncontrolled however recent PCP adjustments in insulin dosing show favorable glucose response - Recommend to continue current medications  - Recommend to check glucose every morning and occasionally 1-2hr after meals, goal <180 for mealtime, and <130 for morning readings - Contacted [employer] for Lantus update: Approved 12/14/2022 through 10/07/2023. Delivery of medication to PCP office is scheduled to arrive 12/18/22 between 11:30am and 3:30pm, [employer] Tracking: [id number] - will provide patient with update via mychart      Hypertension: - Currently controlled - Reviewed appropriate blood pressure monitoring technique and reviewed goal blood pressure. Recommended to check home blood pressure and heart rate periodically - Recommend to continue current regimen     Follow Up Plan: 6 months  Larinda Buttery, PharmD Clinical Pharmacist [facility] [phone]

## 2022-12-17 NOTE — Patient Instructions (Signed)
Jake Church speaking with you today. As discussed, keep checking your blood sugars and blood pressure. Your blood pressure looks great at this time. Your blood sugars also look great in the morning, but spot check some numbers about 1-2 hours after food, for a goal of less than 180.   Your shipment of lantus is set to arrive tomorrow, 12/18/22. Feel free to pick it up tomorrow afternoon or Thursday.   Take care, Luana Shu, PharmD Clinical Pharmacist [facility] [phone]

## 2023-01-22 ENCOUNTER — Other Ambulatory Visit: Payer: Self-pay | Admitting: Family Medicine

## 2023-01-22 DIAGNOSIS — Z794 Long term (current) use of insulin: Secondary | ICD-10-CM

## 2023-01-22 DIAGNOSIS — M48061 Spinal stenosis, lumbar region without neurogenic claudication: Secondary | ICD-10-CM

## 2023-01-22 DIAGNOSIS — M5412 Radiculopathy, cervical region: Secondary | ICD-10-CM

## 2023-01-29 ENCOUNTER — Telehealth: Payer: Self-pay | Admitting: Family Medicine

## 2023-01-29 NOTE — Telephone Encounter (Signed)
Contacted Tera Mater Driggers to schedule their annual wellness visit. Appointment made for 02/13/2023 at 9 AM.  Cira Servant Patient Access Advocate II Direct Dial: [phone]

## 2023-02-12 ENCOUNTER — Other Ambulatory Visit: Payer: Self-pay | Admitting: Family Medicine

## 2023-02-12 DIAGNOSIS — M48061 Spinal stenosis, lumbar region without neurogenic claudication: Secondary | ICD-10-CM

## 2023-02-12 DIAGNOSIS — I10 Essential (primary) hypertension: Secondary | ICD-10-CM

## 2023-02-13 ENCOUNTER — Telehealth: Payer: Self-pay | Admitting: General Practice

## 2023-02-13 NOTE — Telephone Encounter (Signed)
Patient was scheduled for medicare wellness visit via telephone for 02/13/23 at 9 am. Attempted to call patient x 3. Left three messages. Patient made aware of no show fee on message # 3.

## 2023-02-20 ENCOUNTER — Ambulatory Visit (INDEPENDENT_AMBULATORY_CARE_PROVIDER_SITE_OTHER): Payer: Medicare HMO | Admitting: Podiatry

## 2023-02-20 ENCOUNTER — Encounter: Payer: Self-pay | Admitting: Podiatry

## 2023-02-20 DIAGNOSIS — M79674 Pain in right toe(s): Secondary | ICD-10-CM | POA: Diagnosis not present

## 2023-02-20 DIAGNOSIS — B351 Tinea unguium: Secondary | ICD-10-CM | POA: Diagnosis not present

## 2023-02-20 DIAGNOSIS — Z794 Long term (current) use of insulin: Secondary | ICD-10-CM | POA: Diagnosis not present

## 2023-02-20 DIAGNOSIS — M79675 Pain in left toe(s): Secondary | ICD-10-CM

## 2023-02-20 DIAGNOSIS — E1165 Type 2 diabetes mellitus with hyperglycemia: Secondary | ICD-10-CM

## 2023-02-20 NOTE — Progress Notes (Signed)
This patient returns to my office for at risk foot care.  This patient requires this care by a professional since this patient will be at risk due to having diabetes and claudication.  This patient is unable to cut nails himself since the patient cannot reach his nails.These nails are painful walking and wearing shoes.  This patient presents for at risk foot care today.  General Appearance  Alert, conversant and in no acute stress.  Vascular  Dorsalis pedis and posterior tibial  pulses are palpable  bilaterally.  Capillary return is within normal limits  bilaterally. Temperature is within normal limits  bilaterally.  Neurologic  Senn-Weinstein monofilament wire test within normal limits  bilaterally. Muscle power within normal limits bilaterally.  Nails Thick disfigured discolored nails with subungual debris  from hallux to fifth toes bilaterally. No evidence of bacterial infection or drainage bilaterally.  Orthopedic  No limitations of motion  feet .  No crepitus or effusions noted.  No bony pathology or digital deformities noted.  Skin  normotropic skin with no porokeratosis noted bilaterally.  No signs of infections or ulcers noted.     Onychomycosis  Pain in right toes  Pain in left toes  Consent was obtained for treatment procedures.   Mechanical debridement of nails 1-5  bilaterally performed with a nail nipper.  Filed with dremel without incident.    Return office visit  3 months                    Told patient to return for periodic foot care and evaluation due to potential at risk complications.   Laurel Smeltz DPM   

## 2023-02-25 ENCOUNTER — Ambulatory Visit: Payer: Medicare HMO | Admitting: Physician Assistant

## 2023-02-25 ENCOUNTER — Ambulatory Visit (HOSPITAL_COMMUNITY)
Admission: RE | Admit: 2023-02-25 | Discharge: 2023-02-25 | Disposition: A | Discharge status: Home / Self Care | Payer: Medicare HMO | Source: Ambulatory Visit | Attending: Vascular Surgery | Admitting: Vascular Surgery

## 2023-02-25 ENCOUNTER — Ambulatory Visit (INDEPENDENT_AMBULATORY_CARE_PROVIDER_SITE_OTHER)
Admission: RE | Admit: 2023-02-25 | Discharge: 2023-02-25 | Disposition: A | Discharge status: Home / Self Care | Payer: Medicare HMO | Source: Ambulatory Visit | Attending: Vascular Surgery | Admitting: Vascular Surgery

## 2023-02-25 VITALS — BP 143/85 | HR 87 | Temp 98.1°F | Wt 207.0 lb

## 2023-02-25 DIAGNOSIS — I739 Peripheral vascular disease, unspecified: Secondary | ICD-10-CM

## 2023-02-25 LAB — VAS US ABI WITH/WO TBI
Left ABI: 0.99
Right ABI: 0.82

## 2023-02-25 NOTE — Progress Notes (Signed)
[facility] [place] HISTORY AND PHYSICAL   History of Present Illness:  Patient is a 67 y.o. year old male who presents for evaluation of PAD.  He moved to [place] from [place].  He developed rest pain and underwent diagnostic angiogram of the right LE.      He has an occluded popliteal artery with tibial disease below.    Popliteal artery: occluded behind and below the knee.  Anterior tibial artery: occluded at its origin. A small island reconstitutes in distal ankle. Tibioperoneal trunk: patent, but with some disease Peroneal artery: dominant artery; small proximally; enlarges distally near the ankle Posterior tibial artery: occluded Pedal circulation: fills via peroneal artery    S/P right common femoral to peroneal bypass with in-situ greater saphenous vein.  He went on  to have his right calf incision dehiscence on 02/28/22.  The right cal has fully healed.  He states he can ambulate as far and as long as he wants.  He denise claudication, non healing wounds or rest pain.    He is still smoking.  He is medically managed on ASA and Lipitor.    Past Medical History:  Diagnosis Date   Anemia    CAD (coronary artery disease)    Diabetes mellitus without complication (HCC)    Diverticulitis    GERD (gastroesophageal reflux disease)    Heart disease    History of kidney stones    passed   Hypertension    Myocardial infarction (HCC)    "small one, blood numbers did not rise.   Peripheral vascular disease (HCC)    Stroke (HCC) 2018   no residual effects   TIA (transient ischemic attack) 2017    Past Surgical History:  Procedure Laterality Date   ABDOMINAL AORTOGRAM W/LOWER EXTREMITY N/A 12/28/2021   Procedure: ABDOMINAL AORTOGRAM W/LOWER EXTREMITY;  Surgeon: [name], MD;  Location: [facility];  Service: Cardiovascular;  Laterality: N/A;   APPLICATION OF WOUND VAC Right 02/28/2022   Procedure: APPLICATION OF WOUND VAC;  Surgeon: [name], MD;  Location: [facility] OR;  Service: Vascular;  Laterality: Right;   BYPASS GRAFT FEMORAL-PERONEAL Right 01/02/2022   Procedure: RIGHT FEMORAL-PERONEAL BYPASS GRAFT;  Surgeon: [name], MD;  Location: [facility] OR;  Service: Vascular;  Laterality: Right;   CARDIAC CATHETERIZATION     CORONARY ANGIOPLASTY WITH STENT PLACEMENT  2010   INCISION AND DRAINAGE OF WOUND Right 02/28/2022   Procedure: IRRIGATION AND DEBRIDEMENT RIGHT LEG WOUND;  Surgeon: [name], MD;  Location: [facility] OR;  Service: Vascular;  Laterality: Right;   LEG ANGIOGRAPHY Right 01/02/2022   Procedure: LEG ANGIOGRAPHY;  Surgeon: [name], MD;  Location: [facility] OR;  Service: Vascular;  Laterality: Right;    ROS:   General:  No weight loss, Fever, chills  HEENT: No recent headaches, no nasal bleeding, no visual changes, no sore throat  Neurologic: No dizziness, blackouts, seizures. No recent symptoms of stroke or mini- stroke. No recent episodes of slurred speech, or temporary blindness.  Cardiac: No recent episodes of chest pain/pressure, no shortness of breath at rest.  No shortness of breath with exertion.  Denies history of atrial fibrillation or irregular heartbeat  Vascular: No history of rest pain in feet.  No history of claudication.  No history of non-healing ulcer, No history of DVT   Pulmonary: No home oxygen, no productive cough, no hemoptysis,  No asthma or wheezing  Musculoskeletal:  [ ]  Arthritis, [ ]   Low back pain,  [ ]  Joint pain  Hematologic:No history of hypercoagulable state.  No history of easy bleeding.  No history of anemia  Gastrointestinal: No hematochezia or melena,  No gastroesophageal reflux, no trouble swallowing  Urinary: [ ]  chronic Kidney disease, [ ]  on HD - [ ]  MWF or [ ]  TTHS, [ ]  Burning with urination, [ ]  Frequent urination, [ ]  Difficulty urinating;   Skin: No rashes  Psychological: No history of anxiety,  No history of depression  Social History Social History    Tobacco Use   Smoking status: Every Day    Packs/day: 0.50    Years: 38.00    Additional pack years: 0.00    Total pack years: 19.00    Types: Cigarettes    Passive exposure: Never   Smokeless tobacco: Never   Tobacco comments:    5-8 cigarettes a day  Vaping Use   Vaping Use: Never used  Substance Use Topics   Alcohol use: Yes    Comment: occasional   Drug use: Not Currently    Types: Marijuana, Cocaine    Comment: stopped 1991    Family History Family History  Problem Relation Age of Onset   Hypertension Mother    Kidney disease Mother    Miscarriages / [place] Mother    Stroke Mother    Heart disease Father    Depression Sister    Kidney disease Sister    Diabetes Brother    Hypertension Brother    Hyperlipidemia Brother    Kidney disease Brother     Allergies  Allergies  Allergen Reactions   Ozempic (0.25 Or 0.5 Mg-Dose) [Semaglutide(0.25 Or 0.5mg -Dos)]     Developed pancreatitis     Current Outpatient Medications  Medication Sig Dispense Refill   Accu-Chek Softclix Lancets lancets Use as instructed 100 each 12   acetaminophen (TYLENOL) 500 MG tablet Take 1,000 mg by mouth every 6 (six) hours as needed (pain.).     amLODipine (NORVASC) 5 MG tablet TAKE 1 TABLET(5 MG) BY MOUTH DAILY 90 tablet 3   aspirin EC 81 MG tablet Take 1 tablet (81 mg total) by mouth daily. Swallow whole.     atorvastatin (LIPITOR) 80 MG tablet Take 1 tablet (80 mg total) by mouth daily. 90 tablet 3   Blood Glucose Monitoring Suppl (ACCU-CHEK AVIVA PLUS) w/Device KIT USE DAILY TO MONITOR BLOOD GLUCOSE 1 kit 0   carvedilol (COREG) 12.5 MG tablet TAKE 1 TABLET(12.5 MG) BY MOUTH TWICE DAILY WITH A MEAL 180 tablet 1   esomeprazole (NEXIUM) 20 MG capsule Take 20 mg by mouth every morning. OTC     gabapentin (NEURONTIN) 300 MG capsule TAKE 1 CAPSULE BY MOUTH THREE TIMES DAILY 90 capsule 1   glucose blood (ACCU-CHEK AVIVA PLUS) test strip Use as instructed 300 strip 12   insulin  glargine (LANTUS SOLOSTAR) 100 UNIT/ML Solostar Pen Administer 35 units twice per day. 51 mL 2   insulin lispro (HUMALOG KWIKPEN) 100 UNIT/ML KwikPen DIAL AND INJECT 15 UNITS UNDER THE SKIN 3 TIMES DAILY. 15 mL 3   lisinopril (ZESTRIL) 40 MG tablet Take 1 tablet (40 mg total) by mouth daily. 90 tablet 3   loratadine (CLARITIN) 10 MG tablet Take 10 mg by mouth in the morning.     meloxicam (MOBIC) 15 MG tablet TAKE 1 TABLET(15 MG) BY MOUTH DAILY AS NEEDED FOR PAIN 90 tablet 1   metFORMIN (GLUCOPHAGE-XR) 500 MG 24 hr tablet TAKE 2 TABLETS(1000 MG) BY MOUTH  DAILY WITH BREAKFAST 180 tablet 1   Multiple Vitamins-Minerals (MULTIVITAMIN WITH MINERALS) tablet Take 1 tablet by mouth daily. Centrum 50+     No current facility-administered medications for this visit.    Physical Examination  Vitals:   02/25/23 1420  BP: (!) 143/85  Pulse: 87  Temp: 98.1 F (36.7 C)  TempSrc: Temporal  SpO2: 98%  Weight: 207 lb (93.9 kg)    Body mass index is 29.7 kg/m.  General:  Alert and oriented, no acute distress HEENT: Normal Neck: No bruit or JVD Pulmonary: Clear to auscultation bilaterally Cardiac: Regular Rate and Rhythm without murmur Abdomen: Soft, non-tender, non-distended, no mass, no scars Skin: No rash Extremity no ischemic skin changes, no open wounds well healed scars right lower leg and groin.   Musculoskeletal: No deformity or edema  Neurologic: Upper and lower extremity motor 5/5 and symmetric  DATA:   ABI Findings:  +---------+------------------+-----+----------+--------+  Right   Rt Pressure (mmHg)IndexWaveform  Comment   +---------+------------------+-----+----------+--------+  Brachial 139                                        +---------+------------------+-----+----------+--------+  PTA     62                0.45 monophasic          +---------+------------------+-----+----------+--------+  DP      114               0.82 triphasic            +---------+------------------+-----+----------+--------+  Great Toe93                0.67                     +---------+------------------+-----+----------+--------+   +---------+------------------+-----+----------+-------+  Left    Lt Pressure (mmHg)IndexWaveform  Comment  +---------+------------------+-----+----------+-------+  Brachial 123                                       +---------+------------------+-----+----------+-------+  PTA     135               0.97 biphasic           +---------+------------------+-----+----------+-------+  DP      138               0.99 monophasic         +---------+------------------+-----+----------+-------+  Great Toe75                0.54                    +---------+------------------+-----+----------+-------+   +-------+-----------+-----------+------------+------------+  ABI/TBIToday's ABIToday's TBIPrevious ABIPrevious TBI  +-------+-----------+-----------+------------+------------+  Right 0.82       0.67       1.16        0.48          +-------+-----------+-----------+------------+------------+  Left  0.99       0.54       1.11        0.50          +-------+-----------+-----------+------------+------------+      Right ABIs appear decreased compared to prior study on 08/13/2022. Left  ABIs and TBIs appear essentially unchanged compared to prior study on  08/13/2022.  Summary:  Right: Resting right ankle-brachial index indicates mild right lower  extremity arterial disease. The right toe-brachial index is abnormal.   Left: Resting left ankle-brachial index is within normal range. The left  toe-brachial index is abnormal.   Right Graft #1: Femoral-peroneal  +------------------+--------+--------+---------+--------+                   PSV cm/sStenosisWaveform Comments  +------------------+--------+--------+---------+--------+  Inflow           63              biphasic            +------------------+--------+--------+---------+--------+  Prox Anastomosis  84              triphasic          +------------------+--------+--------+---------+--------+  Proximal Graft    70              biphasic           +------------------+--------+--------+---------+--------+  Mid Graft         88              biphasic           +------------------+--------+--------+---------+--------+  Distal Graft      134             triphasic          +------------------+--------+--------+---------+--------+  Distal Anastomosis83              biphasic           +------------------+--------+--------+---------+--------+  Outflow          81              biphasic           +------------------+--------+--------+---------+--------+     Summary:  Right: Patent right femoral-peroneal artery bypass graft without evidence  of hemodynamically significant stenosis.    ASSESSMENT/ PLAN:   67 y.o. male here for follow up for PAD. He most recently underwent debridement of right medial calf wound and initiation of wound vac therapy 02/28/22 by Dr. Lenell Antu. Prior to this he underwent in situ right common femoral to peroneal bypass 01/02/22. His wound has completely healed.   He denies claudication, rest or non healing wounds.  He states he can walk as far as he wants or needs.   Right: Patent right femoral-peroneal artery bypass graft without evidence  of hemodynamically significant stenosis.   Right ABIs appear decreased compared to prior study on 08/13/2022. Left  ABIs and TBIs appear essentially unchanged compared to prior study on  08/13/2022.   He was counseled on smoking cessation.   He will f/u in 1 year for repeat surveillance studies.  If he has concerns he will call sooner. I did encourage him to walk for exercise.      Mosetta Pigeon PA-C [facility] [place] Office: [phone]  MD in clinic [place]

## 2023-02-27 ENCOUNTER — Telehealth: Payer: Self-pay | Admitting: Family Medicine

## 2023-02-27 DIAGNOSIS — E1165 Type 2 diabetes mellitus with hyperglycemia: Secondary | ICD-10-CM

## 2023-02-27 MED ORDER — ACCU-CHEK AVIVA PLUS VI STRP: ORAL_STRIP | 12 refills | Status: DC

## 2023-02-27 NOTE — Telephone Encounter (Signed)
Pt called. He is requesting a refill on his test strips. Pharmacy: [facility].

## 2023-02-27 NOTE — Telephone Encounter (Signed)
Rx sent to pharmacy   

## 2023-03-06 ENCOUNTER — Other Ambulatory Visit: Payer: Self-pay

## 2023-03-06 DIAGNOSIS — I739 Peripheral vascular disease, unspecified: Secondary | ICD-10-CM

## 2023-03-06 DIAGNOSIS — I70221 Atherosclerosis of native arteries of extremities with rest pain, right leg: Secondary | ICD-10-CM

## 2023-03-17 ENCOUNTER — Ambulatory Visit (INDEPENDENT_AMBULATORY_CARE_PROVIDER_SITE_OTHER): Payer: Medicare HMO | Admitting: Family Medicine

## 2023-03-17 ENCOUNTER — Encounter: Payer: Self-pay | Admitting: Family Medicine

## 2023-03-17 VITALS — BP 127/71 | HR 78 | Ht 70.0 in | Wt 208.0 lb

## 2023-03-17 DIAGNOSIS — Z794 Long term (current) use of insulin: Secondary | ICD-10-CM | POA: Diagnosis not present

## 2023-03-17 DIAGNOSIS — I2510 Atherosclerotic heart disease of native coronary artery without angina pectoris: Secondary | ICD-10-CM

## 2023-03-17 DIAGNOSIS — E782 Mixed hyperlipidemia: Secondary | ICD-10-CM

## 2023-03-17 DIAGNOSIS — I739 Peripheral vascular disease, unspecified: Secondary | ICD-10-CM

## 2023-03-17 DIAGNOSIS — I10 Essential (primary) hypertension: Secondary | ICD-10-CM

## 2023-03-17 DIAGNOSIS — E1165 Type 2 diabetes mellitus with hyperglycemia: Secondary | ICD-10-CM

## 2023-03-17 LAB — POCT GLYCOSYLATED HEMOGLOBIN (HGB A1C): HbA1c, POC (controlled diabetic range): 8.3 % — AB (ref 0.0–7.0)

## 2023-03-17 MED ORDER — INSULIN LISPRO (1 UNIT DIAL) 100 UNIT/ML (KWIKPEN): 20.0000 [IU] | PEN_INJECTOR | Freq: Three times a day (TID) | SUBCUTANEOUS | 1 refills | Status: DC

## 2023-03-17 MED ORDER — LANTUS SOLOSTAR 100 UNIT/ML ~~LOC~~ SOPN: 40.0000 [IU] | PEN_INJECTOR | Freq: Two times a day (BID) | SUBCUTANEOUS | 2 refills | Status: AC

## 2023-03-17 NOTE — Patient Instructions (Signed)
Increase Lantus to 40 units BID  Continue Humalog at 20 units with meals.  Follow a low carbohydrate diet.  Try flonase daily.  Let me know if this doesn't help with choking sensation .

## 2023-03-17 NOTE — Assessment & Plan Note (Addendum)
A1c is relatively the same as his last reading.  Encouraged to work on dietary change.  Increasing Lantus to 40 units twice daily.  Humalog 20 units 3 times daily with meals.

## 2023-03-17 NOTE — Assessment & Plan Note (Signed)
Stable at this time.  He is followed by vascular surgery.

## 2023-03-17 NOTE — Assessment & Plan Note (Signed)
Blood pressure is well-controlled.  Recommend continuation of current medications for management of hypertension. 

## 2023-03-17 NOTE — Assessment & Plan Note (Signed)
Continue atorvastatin at current strength.  

## 2023-03-17 NOTE — Progress Notes (Signed)
Positive Erik Rose - 67 y.o. male MRN [id number]  Date of birth: [birth year]  Subjective Chief Complaint  Patient presents with   Hypertension   Diabetes    HPI Erik Rose is a 67 y.o. male here today for follow up visit.   He reports that he is doing pretty well.  Having sensation of choking.  Feels sensation in his throat that causes him to cough to the point where he gags.  Feels like he has drainage coming months.  Feels like his reflux is well-controlled.  Cotninues on lantus 35 units bid with humalog 15 units TID.   Remains on metformin as well he has not been checking glucose at home recently..   Tolerating atorvastatin well at current strength.  A1c is actually up some at 8.3%.  BP is well controlled with amlodipine, coreg and lisinopril at current strength.  Denies new chest pain, shortness of breath, palpitations, headache or vision changes.   ROS:  A comprehensive ROS was completed and negative except as noted per HPI   Allergies  Allergen Reactions   Ozempic (0.25 Or 0.5 Mg-Dose) [Semaglutide(0.25 Or 0.5mg -Dos)]     Developed pancreatitis    Past Medical History:  Diagnosis Date   Anemia    CAD (coronary artery disease)    Diabetes mellitus without complication (HCC)    Diverticulitis    GERD (gastroesophageal reflux disease)    Heart disease    History of kidney stones    passed   Hypertension    Myocardial infarction (HCC)    "small one, blood numbers did not rise.   Peripheral vascular disease (HCC)    Stroke (HCC) 2018   no residual effects   TIA (transient ischemic attack) 2017    Past Surgical History:  Procedure Laterality Date   ABDOMINAL AORTOGRAM W/LOWER EXTREMITY N/A 12/28/2021   Procedure: ABDOMINAL AORTOGRAM W/LOWER EXTREMITY;  Surgeon: [name], MD;  Location: [facility];  Service: Cardiovascular;  Laterality: N/A;   APPLICATION OF WOUND VAC Right 02/28/2022   Procedure: APPLICATION OF WOUND VAC;  Surgeon:  [name], MD;  Location: [facility];  Service: Vascular;  Laterality: Right;   BYPASS GRAFT FEMORAL-PERONEAL Right 01/02/2022   Procedure: RIGHT FEMORAL-PERONEAL BYPASS GRAFT;  Surgeon: [name], MD;  Location: [facility];  Service: Vascular;  Laterality: Right;   CARDIAC CATHETERIZATION     CORONARY ANGIOPLASTY WITH STENT PLACEMENT  2010   INCISION AND DRAINAGE OF WOUND Right 02/28/2022   Procedure: IRRIGATION AND DEBRIDEMENT RIGHT LEG WOUND;  Surgeon: [name], MD;  Location: [facility];  Service: Vascular;  Laterality: Right;   LEG ANGIOGRAPHY Right 01/02/2022   Procedure: LEG ANGIOGRAPHY;  Surgeon: [name], MD;  Location: [facility];  Service: Vascular;  Laterality: Right;    Social History   Socioeconomic History   Marital status: Married    Spouse name: [name]   Number of children: 4   Years of education: 14   Highest education level: Associate degree: academic program  Occupational History    Comment: Disability/retired.  Tobacco Use   Smoking status: Every Day    Packs/day: 0.50    Years: 38.00    Additional pack years: 0.00    Total pack years: 19.00    Types: Cigarettes    Passive exposure: Never   Smokeless tobacco: Never   Tobacco comments:    5-8 cigarettes a day  Vaping Use   Vaping Use: Never used  Substance  and Sexual Activity   Alcohol use: Yes    Comment: occasional   Drug use: Not Currently    Types: Marijuana, Cocaine    Comment: stopped 1991   Sexual activity: Yes    Partners: Female  Other Topics Concern   Not on file  Social History Narrative   Live alone. 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 Likes to [employer] range once a month.    Social Determinants of Health   Financial Resource Strain: Low Risk  (12/08/2020)   Overall Financial Resource Strain (CARDIA)    Difficulty of Paying Living Expenses: Not hard at all  Food Insecurity: No Food Insecurity (12/08/2020)   Hunger Vital Sign    Worried About Running Out of Food in the Last Year: Never  true    Ran Out of Food in the Last Year: Never true  Transportation Needs: No Transportation Needs (12/08/2020)   PRAPARE - Administrator, Civil Service (Medical): No    Lack of Transportation (Non-Medical): No  Physical Activity: Sufficiently Active (12/08/2020)   Exercise Vital Sign    Days of Exercise per Week: 7 days    Minutes of Exercise per Session: 120 min  Stress: No Stress Concern Present (12/08/2020)   [employer] of Occupational Health - Occupational Stress Questionnaire    Feeling of Stress : Not at all  Social Connections: Moderately Isolated (12/08/2020)   Social Connection and Isolation Panel [NHANES]    Frequency of Communication with Friends and Family: More than three times a week    Frequency of Social Gatherings with Friends and Family: More than three times a week    Attends Religious Services: Never    Database administrator or Organizations: No    Attends Engineer, structural: Never    Marital Status: Married    Family History  Problem Relation Age of Onset   Hypertension Mother    Kidney disease Mother    Miscarriages / Stillbirths Mother    Stroke Mother    Heart disease Father    Depression Sister    Kidney disease Sister    Diabetes Brother    Hypertension Brother    Hyperlipidemia Brother    Kidney disease Brother     Health Maintenance  Topic Date Due   COVID-19 Vaccine (4 - 2023-24 season) 04/02/2023 (Originally 06/07/2022)   Medicare Annual Wellness (AWV)  06/17/2023 (Originally 12/08/2021)   Colonoscopy  08/16/2023 (Originally 01/17/2019)   Hepatitis C Screening  03/16/2024 (Originally 08/13/1974)   Lung Cancer Screening  06/16/2024 (Originally 05/24/2021)   INFLUENZA VACCINE  05/08/2023   FOOT EXAM  08/15/2023   Diabetic kidney evaluation - Urine ACR  08/16/2023   Diabetic kidney evaluation - eGFR measurement  09/11/2023   HEMOGLOBIN A1C  09/16/2023   OPHTHALMOLOGY EXAM  11/18/2023   DTaP/Tdap/Td (2 - Td or Tdap)  07/19/2030   Pneumonia Vaccine 75+ Years old  Completed   HIV Screening  Completed   Zoster Vaccines- Shingrix  Completed   HPV VACCINES  Aged Out     ----------------------------------------------------------------------------------------------------------------------------------------------------------------------------------------------------------------- Physical Exam BP 127/71 (BP Location: Left Arm, Patient Position: Sitting, Cuff Size: Large)   Pulse 78   Ht 5\' 10"  (1.778 m)   Wt 208 lb (94.3 kg)   SpO2 99%   BMI 29.84 kg/m   Physical Exam Constitutional:      Appearance: Normal appearance.  HENT:     Head: Normocephalic and atraumatic.  Cardiovascular:     Rate  and Rhythm: Normal rate and regular rhythm.  Pulmonary:     Effort: Pulmonary effort is normal.     Breath sounds: Normal breath sounds.  Musculoskeletal:     Cervical back: Neck supple.  Neurological:     Mental Status: He is alert.  Psychiatric:        Mood and Affect: Mood normal.        Behavior: Behavior normal.     ------------------------------------------------------------------------------------------------------------------------------------------------------------------------------------------------------------------- Assessment and Plan  Type 2 diabetes mellitus with hyperglycemia, with long-term current use of insulin (HCC) A1c is relatively the same as his last reading.  Encouraged to work on dietary change.  Increasing Lantus to 40 units twice daily.  Humalog 20 units 3 times daily with meals.  Essential hypertension Blood pressure is well-controlled.  Recommend continuation of current medications for management of hypertension.  PAD (peripheral artery disease) (HCC) Stable at this time.  He is followed by vascular surgery.  Mixed hyperlipidemia Continue atorvastatin at current strength.    Meds ordered this encounter  Medications   insulin glargine (LANTUS SOLOSTAR) 100 UNIT/ML  Solostar Pen    Sig: Inject 40 Units into the skin 2 (two) times daily. Inject 40 units BID.    Dispense:  45 mL    Refill:  2   insulin lispro (HUMALOG KWIKPEN) 100 UNIT/ML KwikPen    Sig: Inject 20 Units into the skin 3 (three) times daily.    Dispense:  54 mL    Refill:  1    Return in about 4 months (around 07/17/2023) for HTN/T2DM.    This visit occurred during the SARS-CoV-2 public health emergency.  Safety protocols were in place, including screening questions prior to the visit, additional usage of staff PPE, and extensive cleaning of exam room while observing appropriate contact time as indicated for disinfecting solutions.
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
## 2023-03-21 ENCOUNTER — Ambulatory Visit: Payer: Medicare HMO | Attending: Cardiology | Admitting: Cardiology

## 2023-03-21 ENCOUNTER — Encounter: Payer: Self-pay | Admitting: Cardiology

## 2023-03-21 VITALS — BP 136/78 | HR 80 | Ht 70.0 in | Wt 209.4 lb

## 2023-03-21 DIAGNOSIS — E1165 Type 2 diabetes mellitus with hyperglycemia: Secondary | ICD-10-CM | POA: Diagnosis not present

## 2023-03-21 DIAGNOSIS — I2510 Atherosclerotic heart disease of native coronary artery without angina pectoris: Secondary | ICD-10-CM

## 2023-03-21 DIAGNOSIS — Z720 Tobacco use: Secondary | ICD-10-CM

## 2023-03-21 DIAGNOSIS — Z794 Long term (current) use of insulin: Secondary | ICD-10-CM

## 2023-03-21 DIAGNOSIS — E782 Mixed hyperlipidemia: Secondary | ICD-10-CM

## 2023-03-21 DIAGNOSIS — I739 Peripheral vascular disease, unspecified: Secondary | ICD-10-CM

## 2023-03-21 DIAGNOSIS — I10 Essential (primary) hypertension: Secondary | ICD-10-CM | POA: Diagnosis not present

## 2023-03-21 MED ORDER — NITROGLYCERIN 0.4 MG SL SUBL: 0.4000 mg | SUBLINGUAL_TABLET | SUBLINGUAL | 3 refills | Status: AC | PRN

## 2023-03-21 NOTE — Patient Instructions (Signed)
Medication Instructions:  Start Nitroglycern 0.4mg  as needed for Chest pain. Place under tongue every five minutes as needed. If pain continues after 3rd pill call 911. *If you need a refill on your cardiac medications before your next appointment, please call your pharmacy*   Follow-Up: At [facility], you and your health needs are our priority.  As part of our continuing mission to provide you with exceptional heart care, we have created designated Provider Care Teams.  These Care Teams include your primary Cardiologist (physician) and Advanced Practice Providers (APPs -  Physician Assistants and Nurse Practitioners) who all work together to provide you with the care you need, when you need it.  We recommend signing up for the patient portal called "MyChart".  Sign up information is provided on this After Visit Summary.  MyChart is used to connect with patients for Virtual Visits (Telemedicine).  Patients are able to view lab/test results, encounter notes, upcoming appointments, etc.  Non-urgent messages can be sent to your provider as well.   To learn more about what you can do with MyChart, go to [email or web address].    Your next appointment:   6 month(s)  Provider:   Dr. Clifton James

## 2023-03-27 ENCOUNTER — Telehealth: Payer: Self-pay

## 2023-03-27 NOTE — Telephone Encounter (Signed)
Forwarding to Miston as an FYI.  (Panya) [employer] PAP shipment for Lantus Solostar 100 u dose (5 boxes) received this morning. Please contact the patient to come and pick up their order today. Placed in the fridge with patient identifier. Thanks in advance.   NDC:[id number] LOT: [id number] EXP: 2025-05-06

## 2023-04-03 ENCOUNTER — Telehealth: Payer: Self-pay | Admitting: Family Medicine

## 2023-04-03 DIAGNOSIS — Z794 Long term (current) use of insulin: Secondary | ICD-10-CM

## 2023-04-03 MED ORDER — ACCU-CHEK GUIDE VI STRP: ORAL_STRIP | 12 refills | Status: DC

## 2023-04-03 NOTE — Telephone Encounter (Signed)
Pt called. Incorrect rx  for test strip sent to pharmacy, correct strip is Accu Check Guide. Also pharmacy never got rx for sleep med.

## 2023-04-14 ENCOUNTER — Other Ambulatory Visit: Payer: Self-pay | Admitting: Family Medicine

## 2023-04-14 ENCOUNTER — Other Ambulatory Visit: Payer: Self-pay

## 2023-04-14 DIAGNOSIS — M48061 Spinal stenosis, lumbar region without neurogenic claudication: Secondary | ICD-10-CM

## 2023-04-14 MED ORDER — LISINOPRIL 40 MG PO TABS: 40.0000 mg | ORAL_TABLET | Freq: Every day | ORAL | 3 refills | Status: AC

## 2023-05-19 DIAGNOSIS — H25013 Cortical age-related cataract, bilateral: Secondary | ICD-10-CM | POA: Diagnosis not present

## 2023-05-19 DIAGNOSIS — E113311 Type 2 diabetes mellitus with moderate nonproliferative diabetic retinopathy with macular edema, right eye: Secondary | ICD-10-CM | POA: Diagnosis not present

## 2023-05-19 DIAGNOSIS — H2513 Age-related nuclear cataract, bilateral: Secondary | ICD-10-CM | POA: Diagnosis not present

## 2023-05-19 DIAGNOSIS — H2511 Age-related nuclear cataract, right eye: Secondary | ICD-10-CM | POA: Diagnosis not present

## 2023-05-19 DIAGNOSIS — E113392 Type 2 diabetes mellitus with moderate nonproliferative diabetic retinopathy without macular edema, left eye: Secondary | ICD-10-CM | POA: Diagnosis not present

## 2023-05-19 LAB — HM DIABETES EYE EXAM

## 2023-05-21 ENCOUNTER — Ambulatory Visit: Payer: Medicare HMO | Admitting: Podiatry

## 2023-05-21 ENCOUNTER — Encounter: Payer: Self-pay | Admitting: Podiatry

## 2023-05-21 DIAGNOSIS — E1165 Type 2 diabetes mellitus with hyperglycemia: Secondary | ICD-10-CM

## 2023-05-21 DIAGNOSIS — B351 Tinea unguium: Secondary | ICD-10-CM

## 2023-05-21 DIAGNOSIS — M79674 Pain in right toe(s): Secondary | ICD-10-CM | POA: Diagnosis not present

## 2023-05-21 DIAGNOSIS — M79675 Pain in left toe(s): Secondary | ICD-10-CM | POA: Diagnosis not present

## 2023-05-21 DIAGNOSIS — Z794 Long term (current) use of insulin: Secondary | ICD-10-CM

## 2023-05-21 NOTE — Progress Notes (Signed)
This patient returns to my office for at risk foot care.  This patient requires this care by a professional since this patient will be at risk due to having diabetes and claudication.  This patient is unable to cut nails himself since the patient cannot reach his nails.These nails are painful walking and wearing shoes.  This patient presents for at risk foot care today.  General Appearance  Alert, conversant and in no acute stress.  Vascular  Dorsalis pedis and posterior tibial  pulses are palpable  bilaterally.  Capillary return is within normal limits  bilaterally. Temperature is within normal limits  bilaterally.  Neurologic  Senn-Weinstein monofilament wire test within normal limits  bilaterally. Muscle power within normal limits bilaterally.  Nails Thick disfigured discolored nails with subungual debris  from hallux to fifth toes bilaterally. No evidence of bacterial infection or drainage bilaterally.  Orthopedic  No limitations of motion  feet .  No crepitus or effusions noted.  No bony pathology or digital deformities noted.  Skin  normotropic skin with no porokeratosis noted bilaterally.  No signs of infections or ulcers noted.     Onychomycosis  Pain in right toes  Pain in left toes  Consent was obtained for treatment procedures.   Mechanical debridement of nails 1-5  bilaterally performed with a nail nipper.  Filed with dremel without incident.    Return office visit  3 months                    Told patient to return for periodic foot care and evaluation due to potential at risk complications.   Gregory Mayer DPM   

## 2023-05-22 ENCOUNTER — Encounter: Payer: Self-pay | Admitting: Family Medicine

## 2023-05-28 ENCOUNTER — Other Ambulatory Visit: Payer: Self-pay

## 2023-05-28 MED ORDER — ATORVASTATIN CALCIUM 80 MG PO TABS: 80.0000 mg | ORAL_TABLET | Freq: Every day | ORAL | 0 refills | Status: DC

## 2023-05-30 ENCOUNTER — Other Ambulatory Visit: Payer: Self-pay | Admitting: Family Medicine

## 2023-05-30 DIAGNOSIS — Z794 Long term (current) use of insulin: Secondary | ICD-10-CM

## 2023-06-05 ENCOUNTER — Other Ambulatory Visit: Payer: Self-pay | Admitting: Family Medicine

## 2023-06-05 DIAGNOSIS — H2511 Age-related nuclear cataract, right eye: Secondary | ICD-10-CM | POA: Diagnosis not present

## 2023-06-06 DIAGNOSIS — H25012 Cortical age-related cataract, left eye: Secondary | ICD-10-CM | POA: Diagnosis not present

## 2023-06-06 DIAGNOSIS — H2512 Age-related nuclear cataract, left eye: Secondary | ICD-10-CM | POA: Diagnosis not present

## 2023-06-06 LAB — HM DIABETES EYE EXAM

## 2023-06-19 ENCOUNTER — Other Ambulatory Visit: Payer: Medicare HMO | Admitting: Pharmacist

## 2023-06-19 ENCOUNTER — Telehealth: Payer: Self-pay | Admitting: Pharmacist

## 2023-06-19 NOTE — Progress Notes (Signed)
Attempted to contact patient for scheduled appointment for medication management. Patient was traveling and requested reschedule. Rescheduled for 06/23/23 at 9:30am  Lynnda Shields, PharmD, BCPS Clinical Pharmacist [facility]

## 2023-06-23 ENCOUNTER — Other Ambulatory Visit: Payer: Medicare HMO | Admitting: Pharmacist

## 2023-06-23 MED ORDER — ONETOUCH ULTRA TEST VI STRP: ORAL_STRIP | 12 refills | Status: DC

## 2023-06-23 NOTE — Progress Notes (Signed)
06/23/2023 Name: Erik Rose MRN: [id number] DOB: 06/14/1956  Chief Complaint  Patient presents with   Diabetes   Hypertension    Erik Rose is a 67 y.o. year old male who presented for a telephone visit.   They were referred to the pharmacist by their PCP for assistance in managing diabetes and hypertension.    Subjective:  Care Team: Primary Care Provider: Everrett Coombe, DO  Medication Access/Adherence  Current Pharmacy:  [id number] [place], [place] - [phone] AT [facility] OF [facility] & [address] [place] [address] Phone: [phone] Fax: [phone]   Diabetes:  Current medications:  Metformin XR 500mg  2 tablets daily Lantus 40 units BID via PAP Humalog 20 units via PAP  Current glucose readings: needs onetouch ultra test strips Has not checked BG in over a month.   Current medication access support: insulins both obtained via PAP  Hypertension:  Current medications: lisinopril 40mg  daily, carvedilol 12.5mg  BID Medications previously tried:   Patient has a validated, automated, upper arm home BP cuff Current blood pressure readings readings: 117/75, 128/80, 130/75   Objective:  Lab Results  Component Value Date   HGBA1C 8.3 (A) 03/17/2023    Lab Results  Component Value Date   CREATININE 0.99 09/10/2022   BUN 17 09/10/2022   NA 139 09/10/2022   K 4.4 09/10/2022   CL 105 09/10/2022   CO2 20 09/10/2022    Lab Results  Component Value Date   CHOL 108 09/10/2022   HDL 38 (L) 09/10/2022   LDLCALC 57 09/10/2022   LDLDIRECT 112.0 09/09/2018   TRIG 60 09/10/2022   CHOLHDL 2.8 09/10/2022    Medications Reviewed Today     Reviewed by Gabriel Carina, RPH (Pharmacist) on 06/23/23 at 1633  Med List Status: <None>   Medication Order Taking? Sig Documenting Provider Last Dose Status Informant  Accu-Chek Softclix Lancets lancets [id number] Yes Use as instructed Everrett Coombe, DO Taking Active   acetaminophen (TYLENOL) 500 MG tablet [id number] Yes Take 1,000 mg by mouth every 6 (six) hours as needed (pain.). [provider] Taking Active Self  amLODipine (NORVASC) 5 MG tablet [id number] Yes TAKE 1 TABLET(5 MG) BY MOUTH DAILY Everrett Coombe, DO Taking Active   aspirin EC 81 MG tablet [id number] Yes Take 1 tablet (81 mg total) by mouth daily. Swallow whole. Meriam Sprague, MD Taking Active Self  atorvastatin (LIPITOR) 80 MG tablet [id number] Yes Take 1 tablet (80 mg total) by mouth daily. Everrett Coombe, DO Taking Active   Blood Glucose Monitoring Suppl (ACCU-CHEK AVIVA PLUS) w/Device KIT [id number]  USE DAILY TO MONITOR BLOOD GLUCOSE Everrett Coombe, DO  Active   carvedilol (COREG) 12.5 MG tablet [id number] Yes TAKE 1 TABLET(12.5 MG) BY MOUTH TWICE DAILY WITH A MEAL Everrett Coombe, DO Taking Active   esomeprazole (NEXIUM) 20 MG capsule [id number] Yes Take 20 mg by mouth every morning. OTC [provider] Taking Active Self  gabapentin (NEURONTIN) 300 MG capsule [id number] Yes TAKE 1 CAPSULE BY MOUTH THREE TIMES DAILY Everrett Coombe, DO Taking Active            Med Note Jenne Campus Jun 23, 2023  9:47 AM) Taking as needed as of 06/23/23 review  glucose blood (ACCU-CHEK GUIDE) test strip [id number]  Use as instructed Everrett Coombe, DO  Active   insulin glargine (LANTUS SOLOSTAR) 100 UNIT/ML Solostar Pen [id number]  Inject 40  Units into the skin 2 (two) times daily. Inject 40 units BID. Everrett Coombe, DO  Expired 04/16/23 2359   insulin lispro (HUMALOG KWIKPEN) 100 UNIT/ML KwikPen [id number]  Inject 20 Units into the skin 3 (three) times daily. Everrett Coombe, DO  Expired 06/15/23 2359   lisinopril (ZESTRIL) 40 MG tablet [id number] Yes Take 1 tablet (40 mg total) by mouth daily. Meriam Sprague, MD Taking Active   loratadine (CLARITIN) 10 MG tablet [id number] Yes Take 10 mg by mouth in the morning. [provider] Taking Active Self   meloxicam (MOBIC) 15 MG tablet [id number] Yes TAKE 1 TABLET(15 MG) BY MOUTH DAILY AS NEEDED FOR PAIN Everrett Coombe, DO Taking Active   metFORMIN (GLUCOPHAGE-XR) 500 MG 24 hr tablet [id number] Yes TAKE 2 TABLETS(1000 MG) BY MOUTH DAILY WITH BREAKFAST Everrett Coombe, DO Taking Active   Multiple Vitamins-Minerals (MULTIVITAMIN WITH MINERALS) tablet [id number] Yes Take 1 tablet by mouth daily. Centrum 50+ [provider] Taking Active Self  nitroGLYCERIN (NITROSTAT) 0.4 MG SL tablet [id number]  Place 1 tablet (0.4 mg total) under the tongue every 5 (five) minutes as needed for chest pain. Meriam Sprague, MD  Expired 06/19/23 2359               Assessment/Plan:   Diabetes: - Currently uncontrolled, though insulin dosing adjusted by PCP  - Reviewed long term cardiovascular and renal outcomes of uncontrolled blood sugar - Reviewed goal A1c, goal fasting, and goal 2 hour post prandial glucose - Recommend to continue current regimen, collaborated with PCP for sending updated test strip brand to pharmacy - Patient expressed interest in [place] CGM, will submit via parachute. Expressed some hesitation that he does not want it alarming constantly, but acknowledged that the BG alarm values and settings can be adjusted.  - Recommend to check glucose using traditional meter until [place] device order is processed - Will add to spreadsheet for rx med assistance team for ongoing PAP renewals    Hypertension: - Currently controlled - Recommend to continue current regimen     Follow Up Plan: will forward to [facility] for ongoing follow ups

## 2023-06-23 NOTE — Patient Instructions (Signed)
Erik Rose,  Thanks for speaking with me today! I sent the correct brand of test strips to your pharmacy.  I also submitted an order for a Libre 3 (the glucose sensor you wear on the arm). Keep an eye out for a phone call, the company usually confirms mailing address, as they will ship to you in the mail. They will call you about cost, but to my knowledge, your insurance should fully cover this cost.  Elnita Maxwell (another pharmacist on my team) will be taking care of you, and she's great! Look to hear from her for future phone calls.  If you have questions or need anything in the meantime, you can reach me at [phone].  Take care, Elmarie Shiley, PharmD, BCPS Clinical Pharmacist [facility]

## 2023-06-26 ENCOUNTER — Encounter: Payer: Self-pay | Admitting: Family Medicine

## 2023-06-26 DIAGNOSIS — E1165 Type 2 diabetes mellitus with hyperglycemia: Secondary | ICD-10-CM | POA: Diagnosis not present

## 2023-07-02 ENCOUNTER — Telehealth: Payer: Self-pay

## 2023-07-02 DIAGNOSIS — Z0001 Encounter for general adult medical examination with abnormal findings: Secondary | ICD-10-CM | POA: Diagnosis not present

## 2023-07-02 DIAGNOSIS — I10 Essential (primary) hypertension: Secondary | ICD-10-CM | POA: Diagnosis not present

## 2023-07-02 DIAGNOSIS — E663 Overweight: Secondary | ICD-10-CM | POA: Diagnosis not present

## 2023-07-02 DIAGNOSIS — E785 Hyperlipidemia, unspecified: Secondary | ICD-10-CM | POA: Diagnosis not present

## 2023-07-02 DIAGNOSIS — Z1159 Encounter for screening for other viral diseases: Secondary | ICD-10-CM | POA: Diagnosis not present

## 2023-07-02 DIAGNOSIS — Z79899 Other long term (current) drug therapy: Secondary | ICD-10-CM | POA: Diagnosis not present

## 2023-07-02 DIAGNOSIS — E1169 Type 2 diabetes mellitus with other specified complication: Secondary | ICD-10-CM | POA: Diagnosis not present

## 2023-07-02 DIAGNOSIS — Z7189 Other specified counseling: Secondary | ICD-10-CM | POA: Diagnosis not present

## 2023-07-02 DIAGNOSIS — Z125 Encounter for screening for malignant neoplasm of prostate: Secondary | ICD-10-CM | POA: Diagnosis not present

## 2023-07-02 NOTE — Progress Notes (Signed)
07/02/2023  Patient ID: Erik Rose, male   DOB: Jun 16, 1956, 67 y.o.   MRN: [id number]  Patient outreach to follow-up after recent telephone visit with Lynnda Shields.  Patient has received [employer]estyle Libre 3 plus CGM, and states he is using successfully.  An order for One Touch Ultra test strips were sent to his pharmacy, because the last strips he received (Accu Chek Aviva) do not go with his glucometer.  Patient states he has not heard from the pharmacy stating these are ready.  Contacted pharmacy, and these had to be ordered but will be ready this afternoon.  Sending patient a MyChart message to notify him.  Follow-up visit scheduled in November to check in on DM and HTN control.  Lenna Gilford, PharmD, DPLA

## 2023-07-09 ENCOUNTER — Telehealth: Payer: Self-pay

## 2023-07-09 NOTE — Telephone Encounter (Signed)
Received from patient assistance  Lantus solostar 5 boxes of latnus solostar 100u/ml  Contacted patient and informed to pick up medications

## 2023-07-21 ENCOUNTER — Ambulatory Visit (INDEPENDENT_AMBULATORY_CARE_PROVIDER_SITE_OTHER): Payer: Medicare HMO | Admitting: Family Medicine

## 2023-07-21 ENCOUNTER — Encounter: Payer: Self-pay | Admitting: Family Medicine

## 2023-07-21 VITALS — BP 128/83 | HR 84 | Ht 70.0 in | Wt 204.8 lb

## 2023-07-21 DIAGNOSIS — M5412 Radiculopathy, cervical region: Secondary | ICD-10-CM | POA: Diagnosis not present

## 2023-07-21 DIAGNOSIS — E1165 Type 2 diabetes mellitus with hyperglycemia: Secondary | ICD-10-CM | POA: Diagnosis not present

## 2023-07-21 DIAGNOSIS — Z23 Encounter for immunization: Secondary | ICD-10-CM

## 2023-07-21 DIAGNOSIS — I10 Essential (primary) hypertension: Secondary | ICD-10-CM | POA: Diagnosis not present

## 2023-07-21 DIAGNOSIS — Z794 Long term (current) use of insulin: Secondary | ICD-10-CM | POA: Diagnosis not present

## 2023-07-21 DIAGNOSIS — E782 Mixed hyperlipidemia: Secondary | ICD-10-CM

## 2023-07-21 DIAGNOSIS — Z8673 Personal history of transient ischemic attack (TIA), and cerebral infarction without residual deficits: Secondary | ICD-10-CM

## 2023-07-21 LAB — POCT GLYCOSYLATED HEMOGLOBIN (HGB A1C): Hemoglobin A1C: 9.1 % — AB (ref 4.0–5.6)

## 2023-07-21 NOTE — Assessment & Plan Note (Signed)
Continue atorvastatin at current strength.

## 2023-07-21 NOTE — Patient Instructions (Signed)
Work on dietary changes to get blood sugars back down.  See me again in 3 months.

## 2023-07-21 NOTE — Assessment & Plan Note (Signed)
Blood pressure is well-controlled.  Recommend continuation of current medications for management of hypertension.

## 2023-07-21 NOTE — Assessment & Plan Note (Signed)
DDD noted on previous MRI in 2021.  Adding physical therapy.  Xrays of cervical spine ordered.  Continue meloxicam as needed.

## 2023-07-21 NOTE — Assessment & Plan Note (Signed)
Continue on ASA and statin.  Keep BP well controlled.

## 2023-07-21 NOTE — Progress Notes (Signed)
Erik Rose - 67 y.o. male MRN [id number]  Date of birth: Sep 15, 1956  Subjective Chief Complaint  Patient presents with   Medical Management of Chronic Issues    HTN, DM last A1C 8.3    HPI Erik Rose is a 67 y.o. male here today for follow up visit.   He has complaint of L shoulder pain.  Started about 2 months ago.  He has radiation down the arm with numbness as well.  Denies weakness. So far he has tried meloxicam which does seem to help. Marland Kitchen   He continues on lantus and humalog with metformin for management of his diabetes.  Receiving insulin through patient assistance program.  Blood sugars have not been very well controlled.  A1c today is 9.1% today.  Admits to drinking sugary beverages and slushies throughout the week.   Tolerating atorvastatin well for associated HLD and history of CVA and PAD.    BP is managed with lisinopril, amlodipine and coreg.  BP is well controlled with current medications.  No side effects at this time.  He has not had chest pain, shortness of breath, palpitations, headache or vision changes.   ROS:  A comprehensive ROS was completed and negative except as noted per HPI  Allergies  Allergen Reactions   Ozempic (0.25 Or 0.5 Mg-Dose) [Semaglutide(0.25 Or 0.5mg -Dos)]     Developed pancreatitis    Past Medical History:  Diagnosis Date   Anemia    CAD (coronary artery disease)    Diabetes mellitus without complication (HCC)    Diverticulitis    GERD (gastroesophageal reflux disease)    Heart disease    History of kidney stones    passed   Hypertension    Myocardial infarction (HCC)    "small one, blood numbers did not rise.   Peripheral vascular disease (HCC)    Stroke (HCC) 2018   no residual effects   TIA (transient ischemic attack) 2017    Past Surgical History:  Procedure Laterality Date   ABDOMINAL AORTOGRAM W/LOWER EXTREMITY N/A 12/28/2021   Procedure: ABDOMINAL AORTOGRAM W/LOWER EXTREMITY;  Surgeon: [name],  MD;  Location: [facility];  Service: Cardiovascular;  Laterality: N/A;   APPLICATION OF WOUND VAC Right 02/28/2022   Procedure: APPLICATION OF WOUND VAC;  Surgeon: [name], MD;  Location: [facility] OR;  Service: Vascular;  Laterality: Right;   BYPASS GRAFT FEMORAL-PERONEAL Right 01/02/2022   Procedure: RIGHT FEMORAL-PERONEAL BYPASS GRAFT;  Surgeon: [name], MD;  Location: [facility] OR;  Service: Vascular;  Laterality: Right;   CARDIAC CATHETERIZATION     CORONARY ANGIOPLASTY WITH STENT PLACEMENT  2010   INCISION AND DRAINAGE OF WOUND Right 02/28/2022   Procedure: IRRIGATION AND DEBRIDEMENT RIGHT LEG WOUND;  Surgeon: [name], MD;  Location: [facility] OR;  Service: Vascular;  Laterality: Right;   LEG ANGIOGRAPHY Right 01/02/2022   Procedure: LEG ANGIOGRAPHY;  Surgeon: [name], MD;  Location: [facility] OR;  Service: Vascular;  Laterality: Right;    Social History   Socioeconomic History   Marital status: Married    Spouse name: [name]   Number of children: 4   Years of education: 14   Highest education level: Associate degree: occupational, Scientist, product/process development, or vocational program  Occupational History    Comment: Disability/retired.  Tobacco Use   Smoking status: Every Day    Current packs/day: 0.50    Average packs/day: 0.5 packs/day for 38.0 years (19.0 ttl pk-yrs)    Types: Cigarettes  Passive exposure: Never   Smokeless tobacco: Never   Tobacco comments:    5-8 cigarettes a day  Vaping Use   Vaping status: Never Used  Substance and Sexual Activity   Alcohol use: Yes    Comment: occasional   Drug use: Not Currently    Types: Marijuana, Cocaine    Comment: stopped 1991   Sexual activity: Yes    Partners: Female  Other Topics Concern   Not on file  Social History Narrative   Live alone. 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 Likes to [employer] range once a month.    Social Determinants of Health   Financial Resource Strain: High Risk (07/20/2023)   Overall Financial Resource  Strain (CARDIA)    Difficulty of Paying Living Expenses: Hard  Food Insecurity: Food Insecurity Present (07/20/2023)   Hunger Vital Sign    Worried About Running Out of Food in the Last Year: Often true    Ran Out of Food in the Last Year: Sometimes true  Transportation Needs: No Transportation Needs (07/20/2023)   PRAPARE - Administrator, Civil Service (Medical): No    Lack of Transportation (Non-Medical): No  Physical Activity: Insufficiently Active (07/20/2023)   Exercise Vital Sign    Days of Exercise per Week: 3 days    Minutes of Exercise per Session: 20 min  Stress: No Stress Concern Present (07/20/2023)   [employer] of Occupational Health - Occupational Stress Questionnaire    Feeling of Stress : Only a little  Social Connections: Unknown (07/20/2023)   Social Connection and Isolation Panel [NHANES]    Frequency of Communication with Friends and Family: More than three times a week    Frequency of Social Gatherings with Friends and Family: Twice a week    Attends Religious Services: Patient declined    Database administrator or Organizations: No    Attends Engineer, structural: Not on file    Marital Status: Separated    Family History  Problem Relation Age of Onset   Hypertension Mother    Kidney disease Mother    Miscarriages / [place] Mother    Stroke Mother    Heart disease Father    Depression Sister    Kidney disease Sister    Diabetes Brother    Hypertension Brother    Hyperlipidemia Brother    Kidney disease Brother     Health Maintenance  Topic Date Due   Medicare Annual Wellness (AWV)  12/08/2021   INFLUENZA VACCINE  05/08/2023   COVID-19 Vaccine (4 - 2023-24 season) 06/08/2023   Diabetic kidney evaluation - Urine ACR  08/16/2023   Colonoscopy  08/16/2023 (Originally 01/17/2019)   Hepatitis C Screening  03/16/2024 (Originally 08/13/1975)   Lung Cancer Screening  06/16/2024 (Originally 05/24/2021)   FOOT EXAM  08/15/2023    Diabetic kidney evaluation - eGFR measurement  09/11/2023   HEMOGLOBIN A1C  01/19/2024   OPHTHALMOLOGY EXAM  06/05/2024   DTaP/Tdap/Td (2 - Td or Tdap) 07/19/2030   Pneumonia Vaccine 21+ Years old  Completed   HIV Screening  Completed   Zoster Vaccines- Shingrix  Completed   HPV VACCINES  Aged Out     ----------------------------------------------------------------------------------------------------------------------------------------------------------------------------------------------------------------- Physical Exam BP 128/83 (BP Location: Left Arm, Patient Position: Sitting, Cuff Size: Large)   Pulse 84   Ht 5\' 10"  (1.778 m)   Wt 204 lb 12 oz (92.9 kg)   SpO2 100%   BMI 29.38 kg/m   Physical Exam Constitutional:  Appearance: Normal appearance.  HENT:     Head: Normocephalic and atraumatic.  Cardiovascular:     Rate and Rhythm: Normal rate and regular rhythm.  Pulmonary:     Effort: Pulmonary effort is normal.     Breath sounds: Normal breath sounds.  Musculoskeletal:     Cervical back: Neck supple.  Neurological:     Mental Status: He is alert.  Psychiatric:        Mood and Affect: Mood normal.        Behavior: Behavior normal.     ------------------------------------------------------------------------------------------------------------------------------------------------------------------------------------------------------------------- Assessment and Plan  Type 2 diabetes mellitus with hyperglycemia, with long-term current use of insulin (HCC) Worsening glucose control.  He will work on dietary changes and follow up in 3 months.  If A1c remains elevated we discussed recommendation for Endocrinology referral.   Cervical radiculopathy DDD noted on previous MRI in 2021.  Adding physical therapy.  Xrays of cervical spine ordered.  Continue meloxicam as needed.   Essential hypertension Blood pressure is well-controlled.  Recommend continuation of current  medications for management of hypertension.  History of CVA (cerebrovascular accident) Continue on ASA and statin.  Keep BP well controlled.   Mixed hyperlipidemia Continue atorvastatin at current strength.    No orders of the defined types were placed in this encounter.   Return in about 3 months (around 10/21/2023) for Type 2 Diabetes.    This visit occurred during the SARS-CoV-2 public health emergency.  Safety protocols were in place, including screening questions prior to the visit, additional usage of staff PPE, and extensive cleaning of exam room while observing appropriate contact time as indicated for disinfecting solutions.

## 2023-07-21 NOTE — Assessment & Plan Note (Addendum)
Worsening glucose control.  He will work on dietary changes and follow up in 3 months.  If A1c remains elevated we discussed recommendation for Endocrinology referral.

## 2023-08-01 ENCOUNTER — Other Ambulatory Visit: Payer: Self-pay | Admitting: Family Medicine

## 2023-08-07 ENCOUNTER — Other Ambulatory Visit: Payer: Self-pay | Admitting: Family Medicine

## 2023-08-14 ENCOUNTER — Other Ambulatory Visit: Payer: Self-pay

## 2023-08-14 NOTE — Progress Notes (Signed)
   08/14/2023  Patient ID: Erik Rose, male   DOB: 1956-01-24, 67 y.o.   MRN: [id number]  Outreach attempt for scheduled telephone visit to follow-up on DM and HTN was unsuccessful.  I was able to leave HIPAA compliant voicemail with my direct phone number.  I will contact patient next week to reschedule if I do not hear back.  Lenna Gilford, PharmD, DPLA

## 2023-08-21 ENCOUNTER — Ambulatory Visit: Payer: Medicare HMO | Admitting: Podiatry

## 2023-08-26 ENCOUNTER — Other Ambulatory Visit: Payer: Self-pay | Admitting: Family Medicine

## 2023-08-26 DIAGNOSIS — M48061 Spinal stenosis, lumbar region without neurogenic claudication: Secondary | ICD-10-CM

## 2023-08-26 DIAGNOSIS — M5412 Radiculopathy, cervical region: Secondary | ICD-10-CM

## 2023-09-02 ENCOUNTER — Telehealth: Payer: Self-pay

## 2023-09-02 NOTE — Progress Notes (Signed)
   09/02/2023  Patient ID: Erik Rose, male   DOB: 07-14-1956, 67 y.o.   MRN: [id number]  Patient outreach to schedule telephone visit to follow-up on management of diabetes and blood pressure.  Appointment scheduled for next Thursday at 9:30am.  Lenna Gilford, PharmD, DPLA

## 2023-09-11 ENCOUNTER — Other Ambulatory Visit: Payer: Self-pay

## 2023-09-11 NOTE — Progress Notes (Signed)
   09/11/2023  Patient ID: [name], male   DOB: 18-Jan-1956, 67 y.o.   MRN: [id number]  Patient outreach for telephone follow-up visit; and I was able to reach the patient, but he did request to reschedule the visit.  This has been moved to Monday 12/9 at 3:30pm.  Lenna Gilford, PharmD, DPLA

## 2023-09-15 ENCOUNTER — Other Ambulatory Visit: Payer: Self-pay

## 2023-09-16 NOTE — Progress Notes (Signed)
   09/16/2023  Patient ID: Erik Rose, male   DOB: 16-Oct-1955, 67 y.o.   MRN: [id number]  S/O Telephone visit to follow-up on management of diabetes  Diabetes Management Plan -Current medications:  Humalog 20 units TID with meals, Lantus 40 units twice daily unless bedtime BG <130 -Patient is using Libre 3 for CGM -Endorses BG dropping into the 50's-60's during the night/very early morning -States BG can go up to 180-200 right after eating  -Last A1c was 9.1% on 10/14  A/P  Diabetes Management Plan -Recommend that patient decrease Lantus to 36 units BID and Humalog to 15 units TID  -Reiterated to make sure to always eat right before or right after taking Humalog  -Patient sees Dr. Ashley Royalty again 1/14 and will be due for an A1c at this time  Follow-up:  I will spot check profile after patient's next visit with Dr. Ashley Royalty to schedule accordingly  Lenna Gilford, PharmD, DPLA

## 2023-10-13 LAB — HM DIABETES EYE EXAM

## 2023-10-21 ENCOUNTER — Ambulatory Visit: Payer: Medicare HMO | Admitting: Family Medicine

## 2023-11-03 ENCOUNTER — Telehealth: Payer: Self-pay

## 2023-11-03 NOTE — Progress Notes (Signed)
   11/03/2023  Patient ID: Erik Rose, male   DOB: [birth year], 69 y.o.   MRN: [id number]  Patient out reach to schedule a follow-up telephone visit to check on control of blood glucose.  Patient was scheduled to see PCP earlier this month, but that appointment was canceled.  Telephone visit scheduled for this coming Thursday at 12:30 PM; I requested that patient come with any recent home BG readings.  Lenna Gilford, PharmD, DPLA

## 2023-11-04 ENCOUNTER — Telehealth: Payer: Self-pay

## 2023-11-04 NOTE — Telephone Encounter (Signed)
PAP: Patient assistance application for Lantus Solostar through [employer] has been mailed to pt's home address on file. Provider portion of application will be faxed to provider's office.  Please note I have faxed the provider portion of the application to Dr. Everrett Coombe for review

## 2023-11-06 ENCOUNTER — Other Ambulatory Visit: Payer: Self-pay

## 2023-11-06 NOTE — Progress Notes (Signed)
   11/06/2023  Patient ID: Erik Rose, male   DOB: 21-Jan-1956, 68 y.o.   MRN: [id number]  Outreach attempt for scheduled telephone visit was unsuccessful, but I was able to leave a HIPAA compliant voicemail with my direct phone number.  If I do not hear back from the patient, I will try to reach him again next week.  Lenna Gilford, PharmD, DPLA

## 2023-11-20 ENCOUNTER — Telehealth: Payer: Self-pay

## 2023-11-20 NOTE — Telephone Encounter (Signed)
Closing encounter as patient has switched PCP's

## 2023-11-20 NOTE — Progress Notes (Signed)
   11/20/2023  Patient ID: Erik Rose, male   DOB: 10-06-1956, 68 y.o.   MRN: [id number]  Patient outreach to schedule a telephone visit to follow-up on management of diabetes and A1c.  I was able to reach the patient, but he states he is no longer followed by Dr. Ashley Royalty.  Making Dr. Ashley Royalty aware as well as medication assistance technician working with patient on PAP.  Lenna Gilford, PharmD, DPLA

## 2024-02-10 ENCOUNTER — Telehealth: Payer: Self-pay

## 2024-02-10 NOTE — Telephone Encounter (Signed)
 Patient was identified as falling into the [place] Measure - Diabetes.   Patient was: Patient is not currently using our practice.

## 2024-02-25 ENCOUNTER — Other Ambulatory Visit: Payer: Self-pay | Admitting: Orthopedic Surgery

## 2024-02-25 DIAGNOSIS — M5412 Radiculopathy, cervical region: Secondary | ICD-10-CM

## 2024-02-27 ENCOUNTER — Other Ambulatory Visit: Payer: Self-pay | Admitting: *Deleted

## 2024-02-27 DIAGNOSIS — I739 Peripheral vascular disease, unspecified: Secondary | ICD-10-CM

## 2024-02-27 DIAGNOSIS — I70221 Atherosclerosis of native arteries of extremities with rest pain, right leg: Secondary | ICD-10-CM

## 2024-03-07 ENCOUNTER — Ambulatory Visit
Admission: RE | Admit: 2024-03-07 | Discharge: 2024-03-07 | Disposition: A | Discharge status: Home / Self Care | Source: Ambulatory Visit | Attending: Orthopedic Surgery | Admitting: Orthopedic Surgery

## 2024-03-07 DIAGNOSIS — M5412 Radiculopathy, cervical region: Secondary | ICD-10-CM

## 2024-03-16 ENCOUNTER — Ambulatory Visit (HOSPITAL_COMMUNITY)
Admission: RE | Admit: 2024-03-16 | Discharge: 2024-03-16 | Disposition: A | Discharge status: Home / Self Care | Source: Ambulatory Visit | Attending: Vascular Surgery | Admitting: Vascular Surgery

## 2024-03-16 ENCOUNTER — Ambulatory Visit: Attending: Vascular Surgery | Admitting: Physician Assistant

## 2024-03-16 ENCOUNTER — Ambulatory Visit (HOSPITAL_COMMUNITY)
Admission: RE | Admit: 2024-03-16 | Discharge: 2024-03-16 | Disposition: A | Discharge status: Home / Self Care | Source: Ambulatory Visit | Attending: Vascular Surgery

## 2024-03-16 VITALS — BP 155/89 | HR 83 | Temp 97.9°F | Resp 18 | Ht 70.0 in | Wt 207.1 lb

## 2024-03-16 DIAGNOSIS — I739 Peripheral vascular disease, unspecified: Secondary | ICD-10-CM | POA: Insufficient documentation

## 2024-03-16 DIAGNOSIS — I70221 Atherosclerosis of native arteries of extremities with rest pain, right leg: Secondary | ICD-10-CM | POA: Diagnosis not present

## 2024-03-16 LAB — VAS US ABI WITH/WO TBI
Left ABI: 0.74
Right ABI: 1.1

## 2024-03-16 NOTE — Progress Notes (Signed)
 Office Note   History of Present Illness   Erik Rose  is a 68 y.o. (Sep 28, 1956) male who presents for surveillance of PAD.  He has a history of in situ right common femoral to peroneal artery bypass on 01/02/2022 by Dr. Edgardo Goodwill.  This was done for critical limb ischemia with tissue loss.  Postoperatively he did require right medial calf wound debridement with wound VAC therapy.  His wounds have since healed.  He returns today for follow-up.  He says that he is doing well.  He denies any rest pain, claudication, or tissue loss.  He says he will intermittently get cramps in his hamstrings when going from a seated to standing position.  This typically happens after he has been mobile throughout the day.  He is also dealing with right 1st through 4th fingertip numbness and bilateral shoulder pain due to known cervical spine disease.   Current Outpatient Medications  Medication Sig Dispense Refill   acetaminophen  (TYLENOL ) 500 MG tablet Take 1,000 mg by mouth every 6 (six) hours as needed (pain.).     amLODipine  (NORVASC ) 5 MG tablet TAKE 1 TABLET(5 MG) BY MOUTH DAILY 90 tablet 3   aspirin  EC 81 MG tablet Take 1 tablet (81 mg total) by mouth daily. Swallow whole.     atorvastatin  (LIPITOR ) 80 MG tablet TAKE 1 TABLET(80 MG) BY MOUTH DAILY 90 tablet 3   carvedilol  (COREG ) 12.5 MG tablet TAKE 1 TABLET(12.5 MG) BY MOUTH TWICE DAILY WITH A MEAL 180 tablet 1   Continuous Glucose Sensor (FREESTYLE LIBRE 3 PLUS SENSOR) MISC by Does not apply route. Change sensor every 15 days.     diclofenac (VOLTAREN) 75 MG EC tablet Take 75 mg by mouth at bedtime. Twice a day, or two pills at bedtime     esomeprazole (NEXIUM) 20 MG capsule Take 20 mg by mouth every morning. OTC     gabapentin  (NEURONTIN ) 300 MG capsule TAKE 1 CAPSULE BY MOUTH THREE TIMES DAILY 90 capsule 1   HUMALOG  KWIKPEN 100 UNIT/ML KwikPen DIAL  AND INJECT 15 UNITS UNDER THE SKIN THREE TIMES DAILY. MAX DAILY DOSE IS 45 UNITS. 45 mL 0    lisinopril  (ZESTRIL ) 40 MG tablet Take 1 tablet (40 mg total) by mouth daily. 90 tablet 3   loratadine  (CLARITIN ) 10 MG tablet Take 10 mg by mouth in the morning.     meloxicam  (MOBIC ) 15 MG tablet TAKE 1 TABLET(15 MG) BY MOUTH DAILY AS NEEDED FOR PAIN 90 tablet 1   metFORMIN  (GLUCOPHAGE -XR) 500 MG 24 hr tablet TAKE 2 TABLETS(1000 MG) BY MOUTH DAILY WITH BREAKFAST 180 tablet 1   Multiple Vitamins-Minerals (MULTIVITAMIN WITH MINERALS) tablet Take 1 tablet by mouth daily. Centrum 50+     nitroGLYCERIN  (NITROSTAT ) 0.3 MG SL tablet Place 0.3 mg under the tongue every 5 (five) minutes as needed for chest pain.     insulin  glargine (LANTUS  SOLOSTAR) 100 UNIT/ML Solostar Pen Inject 40 Units into the skin 2 (two) times daily. Inject 40 units BID. (Patient taking differently: Inject 36 Units into the skin 2 (two) times daily. Inject 40 units BID.) 45 mL 2   nitroGLYCERIN  (NITROSTAT ) 0.4 MG SL tablet Place 1 tablet (0.4 mg total) under the tongue every 5 (five) minutes as needed for chest pain. 90 tablet 3   No current facility-administered medications for this visit.    REVIEW OF SYSTEMS (negative unless checked):   Cardiac:  []  Chest pain or chest pressure? []  Shortness of breath upon activity? []   Shortness of breath when lying flat? []  Irregular heart rhythm?  Vascular:  []  Pain in calf, thigh, or hip brought on by walking? []  Pain in feet at night that wakes you up from your sleep? []  Blood clot in your veins? []  Leg swelling?  Pulmonary:  []  Oxygen at home? []  Productive cough? []  Wheezing?  Neurologic:  []  Sudden weakness in arms or legs? []  Sudden numbness in arms or legs? []  Sudden onset of difficult speaking or slurred speech? []  Temporary loss of vision in one eye? []  Problems with dizziness?  Gastrointestinal:  []  Blood in stool? []  Vomited blood?  Genitourinary:  []  Burning when urinating? []  Blood in urine?  Psychiatric:  []  Major depression  Hematologic:  []   Bleeding problems? []  Problems with blood clotting?  Dermatologic:  []  Rashes or ulcers?  Constitutional:  []  Fever or chills?  Ear/Nose/Throat:  []  Change in hearing? []  Nose bleeds? []  Sore throat?  Musculoskeletal:  []  Back pain? []  Joint pain? []  Muscle pain?   Physical Examination    Vitals:   03/16/24 1108  BP: (!) 155/89  Pulse: 83  Resp: 18  Temp: 97.9 F (36.6 C)  TempSrc: Temporal  SpO2: 98%  Weight: 207 lb 1.6 oz (93.9 kg)  Height: 5\' 10"  (1.778 m)   Body mass index is 29.72 kg/m.  General:  WDWN in NAD; vital signs documented above Gait: Not observed HENT: WNL, normocephalic Pulmonary: normal non-labored breathing , without rales, rhonchi,  wheezing Cardiac: regular Abdomen: soft, NT, no masses Skin: without rashes Vascular Exam/Pulses: nonpalpable pedal pulses Extremities: without ischemic changes, without gangrene , without cellulitis; without open wounds;  Musculoskeletal: no muscle wasting or atrophy  Neurologic: A&O X 3;  No focal weakness or paresthesias are detected Psychiatric:  The pt has Normal affect.  Non-Invasive Vascular Imaging ABI (03/16/2024) R:  ABI: 1.1 (0.82),  PT: mono DP: mono TBI:  0.47 L:  ABI: 0.74 (0.99),  PT: mono DP: mono TBI: 0.28   RLE Bypass Duplex (03/16/2024) Patent right common femoral to peroneal artery bypass graft without stenosis   Medical Decision Making   Erik Rose  is a 68 y.o. male who presents for surveillance of PAD  Based on the patient's vascular studies, his ABIs appear improved on the right from 0.82 to 1.1.  His ABIs on the left have slightly decreased from 0.99 to 0.74 RLE bypass graft duplex demonstrates a patent right common femoral to peroneal artery bypass graft without stenosis He denies any claudication, rest pain, or tissue loss.  On exam he has brisk DP/PT Doppler signals bilaterally.  His pedal pulses are nonpalpable. He can follow-up with our office in 1 year  with repeat RLE BPG duplex and ABIs   Deneise Finlay PA-C [facility] Office: [phone]  Clinic MD: Marcelino Sera

## 2024-03-26 ENCOUNTER — Encounter: Payer: Self-pay | Admitting: *Deleted

## 2024-04-08 ENCOUNTER — Other Ambulatory Visit: Payer: Self-pay | Admitting: *Deleted

## 2024-04-08 DIAGNOSIS — Z122 Encounter for screening for malignant neoplasm of respiratory organs: Secondary | ICD-10-CM

## 2024-04-08 DIAGNOSIS — F17210 Nicotine dependence, cigarettes, uncomplicated: Secondary | ICD-10-CM

## 2024-04-08 DIAGNOSIS — Z87891 Personal history of nicotine dependence: Secondary | ICD-10-CM

## 2024-04-15 ENCOUNTER — Encounter: Payer: Self-pay | Admitting: Acute Care

## 2024-04-21 ENCOUNTER — Ambulatory Visit
Admission: RE | Admit: 2024-04-21 | Discharge: 2024-04-21 | Disposition: A | Discharge status: Home / Self Care | Source: Ambulatory Visit | Attending: Acute Care | Admitting: Acute Care

## 2024-04-21 DIAGNOSIS — Z122 Encounter for screening for malignant neoplasm of respiratory organs: Secondary | ICD-10-CM

## 2024-04-21 DIAGNOSIS — Z87891 Personal history of nicotine dependence: Secondary | ICD-10-CM

## 2024-04-21 DIAGNOSIS — F17210 Nicotine dependence, cigarettes, uncomplicated: Secondary | ICD-10-CM

## 2024-05-03 ENCOUNTER — Other Ambulatory Visit: Payer: Self-pay

## 2024-05-03 ENCOUNTER — Encounter: Payer: Self-pay | Admitting: Gastroenterology

## 2024-05-03 DIAGNOSIS — Z122 Encounter for screening for malignant neoplasm of respiratory organs: Secondary | ICD-10-CM

## 2024-05-03 DIAGNOSIS — F17210 Nicotine dependence, cigarettes, uncomplicated: Secondary | ICD-10-CM

## 2024-05-03 DIAGNOSIS — Z87891 Personal history of nicotine dependence: Secondary | ICD-10-CM

## 2024-05-24 ENCOUNTER — Encounter

## 2024-05-24 ENCOUNTER — Telehealth: Payer: Self-pay | Admitting: *Deleted

## 2024-05-24 NOTE — Telephone Encounter (Signed)
 Attempt to reach pt for pre-visit. LM with call back #.  Will attempt to reach again in 5 min due to no other # listed in profile  Second attempt to reach pt for pre-vist unsuccessful. LM with facility # for pt to call back. Instructed pt to call # given by end of the day and reschedule the pre-visit  with RN or the scheduled procedure will be canceled.

## 2024-05-25 ENCOUNTER — Telehealth: Payer: Self-pay | Admitting: *Deleted

## 2024-05-25 ENCOUNTER — Ambulatory Visit

## 2024-05-25 NOTE — Telephone Encounter (Signed)
 Attempt to reach pt for pre-visit. LM with call back #.  Will attempt to reach again in 5 min due to no other # listed in profile  Second attempt to reach pt for pre-vist unsuccessful. LM with facility # for pt to call back. Instructed pt to call # given by end of the day and reschedule the pre-visit  with RN or the scheduled procedure will be canceled.

## 2024-05-25 NOTE — Progress Notes (Unsigned)
 Erik Rose

## 2024-05-27 ENCOUNTER — Encounter

## 2024-05-27 ENCOUNTER — Telehealth: Payer: Self-pay

## 2024-05-27 NOTE — Telephone Encounter (Signed)
 RN attempted on two separate occasions to contact patient for scheduled pre-visit. Patient did not answer either time. RN left message, letting patient know that if pre-visit is not completed or rescheduled today, we will need to cancel his upcoming procedure.  Patient did not return call. RN placed letter in the mail letting patient know that his procedure has been cancelled.

## 2024-06-08 ENCOUNTER — Encounter: Payer: Self-pay | Admitting: Sports Medicine

## 2024-06-11 ENCOUNTER — Encounter: Admitting: Gastroenterology

## 2024-07-14 ENCOUNTER — Encounter: Payer: Self-pay | Admitting: Podiatry

## 2024-07-14 ENCOUNTER — Ambulatory Visit: Admitting: Podiatry

## 2024-07-14 DIAGNOSIS — M79674 Pain in right toe(s): Secondary | ICD-10-CM

## 2024-07-14 DIAGNOSIS — M79675 Pain in left toe(s): Secondary | ICD-10-CM | POA: Diagnosis not present

## 2024-07-14 DIAGNOSIS — E1165 Type 2 diabetes mellitus with hyperglycemia: Secondary | ICD-10-CM

## 2024-07-14 DIAGNOSIS — Z794 Long term (current) use of insulin: Secondary | ICD-10-CM

## 2024-07-14 DIAGNOSIS — B351 Tinea unguium: Secondary | ICD-10-CM | POA: Diagnosis not present

## 2024-07-14 DIAGNOSIS — I739 Peripheral vascular disease, unspecified: Secondary | ICD-10-CM

## 2024-07-14 NOTE — Progress Notes (Addendum)
 This patient returns to my office for at risk foot care.  This patient requires this care by a professional since this patient will be at risk due to having diabetes and claudication.  This patient is unable to cut nails himself since the patient cannot reach his nails.These nails are painful walking and wearing shoes.  This patient presents for at risk foot care today.  General Appearance  Alert, conversant and in no acute stress.  Vascular  Dorsalis pedis and posterior tibial  pulses are palpable /diminished  bilaterally.  Capillary return is within normal limits  bilaterally. Temperature is within normal limits  bilaterally.  Neurologic  Senn-Weinstein monofilament wire test within normal limits  bilaterally. Muscle power within normal limits bilaterally.  Nails Thick disfigured discolored nails with subungual debris  from hallux to fifth toes bilaterally. No evidence of bacterial infection or drainage bilaterally.  Orthopedic  No limitations of motion  feet .  No crepitus or effusions noted.  No bony pathology or digital deformities noted.  Skin  normotropic skin with no porokeratosis noted bilaterally.  No signs of infections or ulcers noted.     Onychomycosis  Pain in right toes  Pain in left toes  Consent was obtained for treatment procedures.   Mechanical debridement of nails 1-5  bilaterally performed with a nail nipper.  Filed with dremel without incident.    Return office visit  3 months                    Told patient to return for periodic foot care and evaluation due to potential at risk complications.   Cordella Bold DPM sm
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
## 2024-07-28 ENCOUNTER — Encounter: Payer: Self-pay | Admitting: Physician Assistant

## 2024-07-28 ENCOUNTER — Ambulatory Visit: Attending: Cardiology | Admitting: Physician Assistant

## 2024-07-28 VITALS — BP 164/88 | HR 79 | Ht 70.0 in | Wt 213.6 lb

## 2024-07-28 DIAGNOSIS — I739 Peripheral vascular disease, unspecified: Secondary | ICD-10-CM

## 2024-07-28 DIAGNOSIS — R600 Localized edema: Secondary | ICD-10-CM

## 2024-07-28 DIAGNOSIS — I10 Essential (primary) hypertension: Secondary | ICD-10-CM | POA: Diagnosis not present

## 2024-07-28 DIAGNOSIS — Z720 Tobacco use: Secondary | ICD-10-CM | POA: Diagnosis not present

## 2024-07-28 DIAGNOSIS — I2510 Atherosclerotic heart disease of native coronary artery without angina pectoris: Secondary | ICD-10-CM

## 2024-07-28 MED ORDER — CARVEDILOL 25 MG PO TABS: 25.0000 mg | ORAL_TABLET | Freq: Two times a day (BID) | ORAL | 3 refills | Status: DC

## 2024-07-28 NOTE — Patient Instructions (Signed)
 Medication Instructions:  Increase carvedilol  to 25 mg twice daily *If you need a refill on your cardiac medications before your next appointment, please call your pharmacy*  Lab Work: CMET, CBC, TSH, LIPIDS-TODAY If you have labs (blood work) drawn today and your tests are completely normal, you will receive your results only by: MyChart Message (if you have MyChart) OR A paper copy in the mail If you have any lab test that is abnormal or we need to change your treatment, we will call you to review the results.  Testing/Procedures: Your physician has requested that you have an echocardiogram. Echocardiography is a painless test that uses sound waves to create images of your heart. It provides your doctor with information about the size and shape of your heart and how well your heart's chambers and valves are working. This procedure takes approximately one hour. There are no restrictions for this procedure. Please do NOT wear cologne, perfume, aftershave, or lotions (deodorant is allowed). Please arrive 15 minutes prior to your appointment time.  Please note: We ask at that you not bring children with you during ultrasound (echo/ vascular) testing. Due to room size and safety concerns, children are not allowed in the ultrasound rooms during exams. Our front office staff cannot provide observation of children in our lobby area while testing is being conducted. An adult accompanying a patient to their appointment will only be allowed in the ultrasound room at the discretion of the ultrasound technician under special circumstances. We apologize for any inconvenience.   Follow-Up: At [facility], you and your health needs are our priority.  As part of our continuing mission to provide you with exceptional heart care, our providers are all part of one team.  This team includes your primary Cardiologist (physician) and Advanced Practice Providers or APPs (Physician Assistants and Nurse  Practitioners) who all work together to provide you with the care you need, when you need it.  Your next appointment:   6 week(s)  Provider:   Dayna Dunn, PA-C         Other Instructions Patients taking aspirin  should generally stay away from medicines like ibuprofen, Advil, Motrin, naproxen, meloxicam , Mobic , diclofenac, Voltaren and Aleve due to risk of stomach bleeding. You may take Tylenol  as directed or talk to your primary doctor about alternatives.  We need to get a better idea of what your blood pressure is running at home. Here are some instructions to follow: - I would recommend using a blood pressure cuff that goes on your arm. The wrist ones can be inaccurate. If you're purchasing one for the first time, try to select one that also reports your heart rate because this can be helpful information as well. - To check your blood pressure, choose a time at least 3 hours after taking your blood pressure medicines. If you can sample it at different times of the day, that's great - it might give you more information about how your blood pressure fluctuates. Remain seated in a chair for 5 minutes quietly beforehand, then check it.  - Please record a list of those readings and call us /send in MyChart message with them for our review 1 week after increasing your carvedilol .

## 2024-07-29 ENCOUNTER — Ambulatory Visit: Payer: Self-pay | Admitting: Physician Assistant

## 2024-07-29 DIAGNOSIS — I10 Essential (primary) hypertension: Secondary | ICD-10-CM

## 2024-07-29 DIAGNOSIS — E782 Mixed hyperlipidemia: Secondary | ICD-10-CM

## 2024-07-29 DIAGNOSIS — Z8673 Personal history of transient ischemic attack (TIA), and cerebral infarction without residual deficits: Secondary | ICD-10-CM

## 2024-07-29 LAB — COMPREHENSIVE METABOLIC PANEL WITH GFR
ALT: 47 IU/L — ABNORMAL HIGH (ref 0–44)
AST: 31 IU/L (ref 0–40)
Albumin: 4.4 g/dL (ref 3.9–4.9)
Alkaline Phosphatase: 131 IU/L — ABNORMAL HIGH (ref 47–123)
BUN/Creatinine Ratio: 16 (ref 10–24)
BUN: 16 mg/dL (ref 8–27)
Bilirubin Total: 0.4 mg/dL (ref 0.0–1.2)
CO2: 20 mmol/L (ref 20–29)
Calcium: 9.9 mg/dL (ref 8.6–10.2)
Chloride: 103 mmol/L (ref 96–106)
Creatinine, Ser: 1.01 mg/dL (ref 0.76–1.27)
Globulin, Total: 2.8 g/dL (ref 1.5–4.5)
Glucose: 207 mg/dL — ABNORMAL HIGH (ref 70–99)
Potassium: 4.9 mmol/L (ref 3.5–5.2)
Sodium: 139 mmol/L (ref 134–144)
Total Protein: 7.2 g/dL (ref 6.0–8.5)
eGFR: 82 mL/min/1.73 (ref >59)

## 2024-07-29 LAB — CBC
Hematocrit: 42.9 % (ref 37.5–51.0)
Hemoglobin: 14.2 g/dL (ref 13.0–17.7)
MCH: 32.3 pg (ref 26.6–33.0)
MCHC: 33.1 g/dL (ref 31.5–35.7)
MCV: 98 fL — ABNORMAL HIGH (ref 79–97)
Platelets: 225 x10E3/uL (ref 150–450)
RBC: 4.39 x10E6/uL (ref 4.14–5.80)
RDW: 12.5 % (ref 11.6–15.4)
WBC: 8.6 x10E3/uL (ref 3.4–10.8)

## 2024-07-29 LAB — LIPID PANEL
Chol/HDL Ratio: 3.3 ratio (ref 0.0–5.0)
Cholesterol, Total: 143 mg/dL (ref 100–199)
HDL: 43 mg/dL (ref >39)
LDL Chol Calc (NIH): 84 mg/dL (ref 0–99)
Triglycerides: 82 mg/dL (ref 0–149)
VLDL Cholesterol Cal: 16 mg/dL (ref 5–40)

## 2024-07-29 LAB — TSH: TSH: 0.915 u[IU]/mL (ref 0.450–4.500)

## 2024-07-30 NOTE — Telephone Encounter (Signed)
-----   Message from Dayna N Dunn sent at 07/29/2024  8:17 AM EDT ----- Please let patient know that his labs are overall stable. There are a few minor abnormalities in his liver function, but not anything that needs to be acutely adjusted. I would just encourage  continued follow up with his primary care for routine health maintenance. I will likely also plan to recheck this in follow up. His cholesterol is not at goal at this time. With his history of stroke  and heart disease, we really want the LDL less than 55. Please refer to pharmD lipid clinic to discuss additional ways to lower. Also bring BP log to that visit.  ----- Message ----- From: Interface, [facility] Lab Results In Sent: 07/29/2024   2:36 AM EDT To: Dayna N Dunn, PA-C

## 2024-07-30 NOTE — Telephone Encounter (Signed)
 Left a message for the patient to call back for lab results.

## 2024-08-04 NOTE — Progress Notes (Signed)
 Spoke with patient regarding lab results. Patient understands that he is to continue to follow up with pcp for health maintenance. He did agree to see pharm d for lipid lowering consult. Order placed for pharm d. He is aware to keep bp log and to bring to nx office appt.

## 2024-08-04 NOTE — Addendum Note (Signed)
 Addended by: MALVINA CHARLENA CROME on: 08/04/2024 09:32 AM   Modules accepted: Orders

## 2024-08-10 ENCOUNTER — Other Ambulatory Visit: Payer: Self-pay | Admitting: Physician Assistant

## 2024-08-12 MED ORDER — CARVEDILOL 25 MG PO TABS: 25.0000 mg | ORAL_TABLET | Freq: Two times a day (BID) | ORAL | 3 refills | Status: AC

## 2024-08-27 ENCOUNTER — Ambulatory Visit (HOSPITAL_COMMUNITY)
Admission: RE | Admit: 2024-08-27 | Discharge: 2024-08-27 | Disposition: A | Discharge status: Home / Self Care | Source: Ambulatory Visit | Attending: Physician Assistant | Admitting: Physician Assistant

## 2024-08-27 DIAGNOSIS — R600 Localized edema: Secondary | ICD-10-CM | POA: Insufficient documentation

## 2024-08-27 LAB — ECHOCARDIOGRAM COMPLETE
AR max vel: 1.97 cm2
AV Area VTI: 1.94 cm2
AV Area mean vel: 1.81 cm2
AV Mean grad: 3 mmHg
AV Peak grad: 4.8 mmHg
Ao pk vel: 1.09 m/s
Area-P 1/2: 3.42 cm2
S' Lateral: 2.65 cm

## 2024-08-31 NOTE — Progress Notes (Deleted)
 Cardiology Office Note    Date:  08/31/2024  ID:  Newton Frutiger Yun , DOB September 05, 1956, MRN [id number] PCP:  Kearney Valentina POUR, FNP  Cardiologist:  [name], MD (Inactive)  Electrophysiologist:  None   Chief Complaint: ***  History of Present Illness: .    Bliss Tsang Freda  is a 68 y.o. male with visit-pertinent history of DMII, HTN, CAD s/p possible PCI in 2010, former tobacco abuse, prior GIB (diverticular bleed x3), PAD s/p LE bypass followed by vascular surgery, CVA 2017, TIA 2018 seen for follow-up.   Per Dr. Lorrane notes, Per review of the record, in 2010, the patient was driving and had the sensation of something being lodged in his throat. He was told told to go to the ED in [place]. There was told he had a mild heart episode, where there was a blockage but no leakage of enzymes. He underwent cardiac catheterization where and a PCI x1 was performed. He is unclear which artery was intervened on. He was on a prolonged course of plavix but developed diverticular bleeding x3 at which point his plavix was stopped.  Last ischemic eval was by cardiac PET in 10/2022 which was abnormal (see full report below) with LAD infarct with periinfarct ischemia and mildly reduced MBF 1.78. Given lack of symptoms, this was managed medically. He also sees [facility] for PAD. He was recently seen 07/2024 with elevated blood pressure. He had reported primary care had recently stopped amlodipine  since BP was controlled, records not available. He had noticed some mild LE edema following discontinuation. We increased his carvedilol  with an eye towards diuretic if edema persisted  Quit smoking Ref lipid clinic see lab result note   Essential HTN Lower extremity edema CAD, HLD, mildly abnormal LFTs   Labwork independently reviewed: LDL 85, trig 82, TSh ok, H/H/Plt OK, K 4.9, CR 1.01, alk phos 131, ALT 47, AST 31  ROS: .    Please see the history of present illness. Otherwise, review of  systems is positive for ***.  All other systems are reviewed and otherwise negative.  Studies Reviewed: SABRA    EKG:  EKG is ordered today, personally reviewed, demonstrating ***  CV Studies: Cardiac studies reviewed are outlined and summarized above. Otherwise please see EMR for full report.   Cardiac PET 10/2022    Perfusion images show LAD territory infarct with perinfarct ischemia, with ischemia at apex and apical anterior/septal/inferior walls.  CT shows dense calcification in proximal LAD, and given reported history of prior PCI in unknown vessel, suspect LAD stent.  Myocardial flow reserve is decreased in LAD/LCX territories and globally, though unreliable in presence of prior PCI.  No TID or drop in EF with stress to suggest severe multivessel disease.  Overall, findings consistent with LAD territory infarct with periinfarct ischemia, and study is intermediate risk.   LV perfusion is abnormal. Defect 1: There is a large defect with moderate reduction in uptake present in the apical to basal anterior, anterolateral and apex location(s) that is partially reversible. There is abnormal wall motion in the defect area. Consistent with peri-infarct ischemia. The defect is consistent with abnormal perfusion in the LAD territory.   Rest left ventricular function is abnormal. Rest EF: 47 %. Stress left ventricular function is abnormal. Stress EF: 51 %. End diastolic cavity size is normal. End systolic cavity size is normal.   Myocardial blood flow was computed to be 0.76ml/g/min at rest and 1.51ml/g/min at stress. Global myocardial blood flow reserve was 1.78  and was abnormal.   Coronary calcium  was present on the attenuation correction CT images. Severe coronary calcifications were present. Coronary calcifications were present in the left anterior descending artery, left circumflex artery and right coronary artery distribution(s).  Suspect LAD stent   Findings are consistent with infarction with  peri-infarct ischemia. The study is intermediate risk.   Electronically signed by Lonni Nanas, MD  Current Reported Medications:.    No outpatient medications have been marked as taking for the 09/01/24 encounter (Appointment) with Abrian Hanover N, PA-C.    Physical Exam:    VS:  There were no vitals taken for this visit.   Wt Readings from Last 3 Encounters:  07/28/24 213 lb 9.6 oz (96.9 kg)  03/16/24 207 lb 1.6 oz (93.9 kg)  07/21/23 204 lb 12 oz (92.9 kg)    GEN: Well nourished, well developed in no acute distress NECK: No JVD; No carotid bruits CARDIAC: ***RRR, no murmurs, rubs, gallops RESPIRATORY:  Clear to auscultation without rales, wheezing or rhonchi  ABDOMEN: Soft, non-tender, non-distended EXTREMITIES:  No edema; No acute deformity   Asessement and Plan:.     ***     Disposition: F/u with ***  Signed, Ambrosio Reuter N Rykker Coviello, PA-C

## 2024-09-01 ENCOUNTER — Ambulatory Visit: Attending: Physician Assistant | Admitting: Physician Assistant

## 2024-09-01 ENCOUNTER — Encounter: Payer: Self-pay | Admitting: Physician Assistant

## 2024-09-01 DIAGNOSIS — E785 Hyperlipidemia, unspecified: Secondary | ICD-10-CM

## 2024-09-01 DIAGNOSIS — I2510 Atherosclerotic heart disease of native coronary artery without angina pectoris: Secondary | ICD-10-CM

## 2024-09-01 DIAGNOSIS — R600 Localized edema: Secondary | ICD-10-CM

## 2024-09-01 DIAGNOSIS — I10 Essential (primary) hypertension: Secondary | ICD-10-CM

## 2024-09-16 LAB — OPHTHALMOLOGY REPORT-SCANNED

## 2024-09-24 ENCOUNTER — Telehealth: Payer: Self-pay

## 2024-09-24 ENCOUNTER — Other Ambulatory Visit (HOSPITAL_COMMUNITY): Payer: Self-pay

## 2024-09-24 ENCOUNTER — Telehealth: Payer: Self-pay | Admitting: Pharmacy Technician

## 2024-09-24 ENCOUNTER — Ambulatory Visit: Attending: Internal Medicine

## 2024-09-24 DIAGNOSIS — E782 Mixed hyperlipidemia: Secondary | ICD-10-CM | POA: Diagnosis not present

## 2024-09-24 DIAGNOSIS — I10 Essential (primary) hypertension: Secondary | ICD-10-CM

## 2024-09-24 MED ORDER — REPATHA SURECLICK 140 MG/ML ~~LOC~~ SOAJ: 140.0000 mg | SUBCUTANEOUS | 2 refills | Status: DC

## 2024-09-24 NOTE — Assessment & Plan Note (Signed)
 Assessment: BP is uncontrolled in office BP 149/ 90 and repeat 156/86 mmHg above the goal (<130/80). No home BP log provided, patient reports home BP is typically 120s-130s/70-80s Tolerates current regimen well without any side effects Denies SOB, palpitation, chest pain, headaches; Report some leg swelling Reiterated the importance of regular exercise and low salt diet  Per patient, previously on amlodipine  but discontinued to reduce medication and burden and normal BP  Caution to retry amlodipine  as patient is experiencing some leg swelling; I would like to assess home BP readings before medication titrations; will request BP log   Plan:  Continue taking carvedilol  25 mg twice daily, lisinopril  40 mg daily  Patient to keep record of BP readings with heart rate and report to us  at the next visit Bring BP monitor to validate Patient to see PharmD in 4-6 weeks for follow up  Consider addition of thiazide diuretic if additional BP control needed

## 2024-09-24 NOTE — Telephone Encounter (Signed)
 Patient Advocate Encounter   The patient was approved for a [employer] grant that will help cover the cost of REPATHA  Total amount awarded, 2500.  Effective: 08/25/24 - 08/24/25   APW:[id number] ERW:[id number] Hmnle:[id number] PI:[id number] [employer] ID: [id number]   Pharmacy provided with approval and processing information. Patient informed via mychart

## 2024-09-24 NOTE — Telephone Encounter (Signed)
 Called patient to inform him of Repatha  copay; Copay is cost prohibitive; will assess grant eligibility

## 2024-09-24 NOTE — Telephone Encounter (Signed)
 Patient walked into clinic reporting that he hit his left shin and the wound is not healing.  He has seen his PCP and was given a course of ABX.  Made appts for ABI and PA visit

## 2024-09-24 NOTE — Addendum Note (Signed)
 Addended by: Graden Hoshino E on: 09/24/2024 05:22 PM   Modules accepted: Orders

## 2024-09-24 NOTE — Telephone Encounter (Signed)
 Grant approved, rx sent, relayed info to patient

## 2024-09-24 NOTE — Telephone Encounter (Signed)
 See other encounter

## 2024-09-24 NOTE — Patient Instructions (Addendum)
 Your Results:             Your most recent labs Goal  Total Cholesterol 143 < 200  Triglycerides 82 < 150  HDL (happy/good cholesterol) 43 > 40  LDL (lousy/bad cholesterol 84 < 55   Medication changes: Continue atorvastatin  80 mg daily  We will start the process to get Repatha covered by your insurance.  Once the prior authorization is complete, we will call you to let you know and confirm pharmacy information.    Lab orders:We want to repeat labs 3 months after starting PCSK9i.  We will send you a lab order to remind        you once we get closer to that time.    Marinna Blane E. Bryam Taborda, Pharm.D, CPP [place] [facility]. [facility] [address], [place], [place] [address] Phone: [phone]; Fax: [phone]     Praluent is a cholesterol medication that improved your body's ability to get rid of bad cholesterol known as LDL. It can lower your LDL up to 60%. It is an injection that is given under the skin every 2 weeks. The most common side effects of Praluent include runny nose, symptoms of the common cold, rarely flu or flu-like symptoms, back/muscle pain in about 3-4% of the patients, and redness, pain, or bruising at the injection site.    Repatha is a cholesterol medication that improved your body's ability to get rid of bad cholesterol known as LDL. It can lower your LDL up to 60%! It is an injection that is given under the skin every 2 weeks. The medication often requires a prior authorization from your insurance company. We will take care of submitting all the necessary information to your insurance company to get it approved. The most common side effects of Repatha include runny nose, symptoms of the common cold, rarely flu or flu-like symptoms, back/muscle pain in about 3-4% of the patients, and redness, pain, or bruising at the injection site.    It is also recommended that patients with high cholesterol adhere to a heart healthy diet, get  regular exercise, avoid use of tobacco products, and maintain a healthy weight. Steps that you can take to help in these areas:  Limit consumption of trans fats, saturated fats, and cholesterol in your diet  Increase intake of lean meats such as chicken, turkey, and fish  Increase intake of foods rich in fiber such as fresh fruits, vegetables, beans and oatmeal Exercise as you are able; even 30 minutes of walking daily can aid in increasing heart health      Copay Assistance:  The [employer] foundation offers assistance to help pay for medication copays.  They will cover copays for all cholesterol lowering meds, including statins, fibrates, omega-3 oils, ezetimibe, Repatha, Praluent, Nexletol, Nexlizet.  The cards are usually good for $2,500 or 12 months, whichever comes first. Go to healthwellfoundation.org Click on Apply Now Answer questions as to whom is applying (patient or representative) Your disease fund will be hypercholesterolemia - Medicare access Select the cholesterol medication you need assistance with (Repatha, Praluent, Nexlizet...) They will ask question about qualifying diagnosis - you can mark yes; and do you have insurance coverage.   When they ask what type of assistance you are interested in - copay assistance When you submit, the approval is usually within minutes.  You will need to print the card information from the site You will need to show this information to  your pharmacy, they will bill your Medicare Part D plan first -then bill [employer] --for the copay.   You can also call them at [phone], although the hold times can be quite long.    Changes made by your pharmacist Dequavion Follette E. Jessenia Filippone, PharmD, CPP at today's visit:    Instructions/Changes  (what do you need to do) Your Notes  (what you did and when you did it)  Continue carvedilol  25 mg twice daily, lisinopril  40 mg daily    2.   Check your blood pressure daily and       bring log to next  appointment    3. PharmD appointment in 11/04/2024 at        1:30 PM    Bring all of your meds, your BP cuff and your record of home blood pressures to your next appointment.   Akari Defelice E. Brently Voorhis, Pharm.D, CPP [place] [facility]. [facility] [address], [place], [place] [address] Phone: [phone]; Fax: [phone]     HOW TO TAKE YOUR BLOOD PRESSURE AT HOME  Rest 5 minutes before taking your blood pressure.  Dont smoke or drink caffeinated beverages for at least 30 minutes before. Take your blood pressure before (not after) you eat. Sit comfortably with your back supported and both feet on the floor (dont cross your legs). Elevate your arm to heart level on a table or a desk. Use the proper sized cuff. It should fit smoothly and snugly around your bare upper arm. There should be enough room to slip a fingertip under the cuff. The bottom edge of the cuff should be 1 inch above the crease of the elbow. Ideally, take 3 measurements at one sitting and record the average.  Important lifestyle changes to control high blood pressure  Intervention  Effect on the BP  Lose extra pounds and watch your waistline Weight loss is one of the most effective lifestyle changes for controlling blood pressure. If you're overweight or obese, losing even a small amount of weight can help reduce blood pressure. Blood pressure might go down by about 1 millimeter of mercury (mm Hg) with each kilogram (about 2.2 pounds) of weight lost.  Exercise regularly As a general goal, aim for at least 30 minutes of moderate physical activity every day. Regular physical activity can lower high blood pressure by about 5 to 8 mm Hg.  Eat a healthy diet Eating a diet rich in whole grains, fruits, vegetables, and low-fat dairy products and low in saturated fat and cholesterol. A healthy diet can lower high blood pressure by up to 11 mm Hg.  Reduce salt (sodium) in your diet Even a small  reduction of sodium in the diet can improve heart health and reduce high blood pressure by about 5 to 6 mm Hg.  Limit alcohol  One drink equals 12 ounces of beer, 5 ounces of wine, or 1.5 ounces of 80-proof liquor.  Limiting alcohol  to less than one drink a day for women or two drinks a day for men can help lower blood pressure by about 4 mm Hg.   If you have any questions or concerns please use My Chart to send questions or call the office at [phone]

## 2024-09-24 NOTE — Progress Notes (Signed)
 Patient ID: Erik Meggett Ramakrishnan                  DOB: 02/23/1956                    MRN: [id number]      HPI: Erik Rose  is a 68 y.o. male patient referred to lipid clinic by Raphael Bring, PA. PMH is significant for HLD, CAD s/p possible PCI in 2010, CVA (2017), TIA (2018), T2DM, PAD s/p LE bypass, and HTN.   HLD:  Patient presents today for lipid clinic. Patient is currently taking atorvastatin  80 mg daily for HLD management and tolerating well with no side effects. Most recent lipid panel revealed LDL on 84, above goal of < 55. Patient referred to PharmD to discuss additional lipid lowering therapy.   We reviewed options for lowering LDL cholesterol, including ezetimibe, PCSK-9 inhibitors. Discussed mechanisms of action, dosing, side effects and potential decreases in LDL cholesterol.  Also reviewed cost information and potential options for patient assistance.   Current Medications: atorvastatin  80 mg daily Intolerances: none Risk Factors: CAD s/p possible PCI in 2010, CVA (2017), TIA (2018), T2DM, PAD s/p LE bypass, and HTN.  LDL goal: < 55 Lipid panel (07/2024): Chol 143, Trig 82, HDL 43, LDL 84 Liver enzymes (07/2024): ALT 47, AST 31, Alk phos 131   HTN:  Patient presents today for PharmD HTN clinic. Patient is currently taking carvedilol  25 mg twice daily and lisinopril  40 mg daily for HTN management. He did not take his medications this morning. Patient states that he was previously on amlodipine  but discontinued by PCP as BP was normal and to reduce medication burden. Patient checks blood pressure at home but does not present with blood pressure log today. He states his BP at home is typically 120s-130s/70-80s. Denies any SOB, chest pain or headache.   Current HTN meds: carvedilol  25 mg twice daily, lisinopril  40 mg daily  Previously tried: amlodipine  (discontinued to reduce medication burden and normal BP) BP goal: < 130/80 Labs (07/2024): K 4.9, Scr 1.01, Crcl 83  ml/min, Na 139, Cl 103  Home BP readings:  No blood pressure log provided today    Diet: Patient does not eat much fast food except breakfast sandwich from [employer] Breakfast: eggs, bacon and toast, or [employer] breakfast sandwich, coffee with creamer or black  Lunch/Dinner: varies, sometimes he skips lunch; spaghetti, peas, asparagus, brussel sprouts  Snacks: ice cream  Beverages: water , pepsi (6-8 oz/day)  Exercise:  Every day walking; no structured exercise due to arthritis in back; uses a walker   Family History:  Relation Problem Comments  Mother (Deceased) Hypertension   Kidney disease   Miscarriages / Stillbirths   Stroke     Father (Deceased) Heart disease     Sister - 2 Metallurgist) Depression   Kidney disease     Brother - 1 (Alive) Diabetes   Hyperlipidemia   Hypertension   Kidney disease      Social History:  Alcohol : none Smoking: none, 90 days without a cigarette   Labs:  Lipid Panel     Component Value Date/Time   CHOL 143 07/28/2024 1004   TRIG 82 07/28/2024 1004   HDL 43 07/28/2024 1004   CHOLHDL 3.3 07/28/2024 1004   CHOLHDL 2.6 01/03/2022 0500   VLDL 13 01/03/2022 0500   LDLCALC 84 07/28/2024 1004   LDLCALC 60 11/15/2021 0000   LDLDIRECT 112.0 09/09/2018 1513   LABVLDL 16 07/28/2024 1004  Past Medical History:  Diagnosis Date   Anemia    CAD (coronary artery disease)    Diabetes mellitus without complication (HCC)    Diverticulitis    GERD (gastroesophageal reflux disease)    Heart disease    History of kidney stones    passed   Hypertension    Myocardial infarction (HCC)    small one, blood numbers did not rise.   Peripheral vascular disease    Stroke (HCC) 2018   no residual effects   TIA (transient ischemic attack) 2017    Medications Ordered Prior to Encounter[1]  Allergies[2]  Assessment/Plan:  1. Hyperlipidemia -  Problem  Essential Hypertension  Mixed Hyperlipidemia   Last Assessment & Plan:  Formatting of  this note might be different from the original.  Mixed hyperlipidemia continue with atorvastatin  no change at this time follow-up with lipid profile now and repeat in 3 months    Essential hypertension Assessment: BP is uncontrolled in office BP 149/ 90 and repeat 156/86 mmHg above the goal (<130/80). No home BP log provided, patient reports home BP is typically 120s-130s/70-80s Tolerates current regimen well without any side effects Denies SOB, palpitation, chest pain, headaches; Report some leg swelling Reiterated the importance of regular exercise and low salt diet  Per patient, previously on amlodipine  but discontinued to reduce medication and burden and normal BP  Caution to retry amlodipine  as patient is experiencing some leg swelling; I would like to assess home BP readings before medication titrations; will request BP log   Plan:  Continue taking carvedilol  25 mg twice daily, lisinopril  40 mg daily  Patient to keep record of BP readings with heart rate and report to us  at the next visit Bring BP monitor to validate Patient to see PharmD in 4-6 weeks for follow up  Consider addition of thiazide diuretic if additional BP control needed  Mixed hyperlipidemia Assessment:  LDL goal: < 55 mg/dl; last LDLc  84  mg/dl (89/7974) Tolerates atorvastatin   well without any side effects; liver enzymes wnl Discussed next potential options (ezetimibe, PCSK-9 inhibitors); cost, dosing efficacy, side effects  Discussed and provided Planning Healthy Meals handout and emphasized importance of regular activity as tolerated; encouraged chair exercises   Plan: Continue taking atorvastatin  80 mg daily Will apply for PA for PCSK9i; will inform patient upon approval  Lipid lab due in 3 months after starting PCSK9i     Thank you,  Eyan Hagood E. Dameer Speiser, Pharm.D, CPP [place] [facility]. [facility] [address], [place], [place] [address] Phone: [phone]; Fax:  [phone]        [1]  Current Outpatient Medications on File Prior to Visit  Medication Sig Dispense Refill   acetaminophen  (TYLENOL ) 500 MG tablet Take 1,000 mg by mouth every 6 (six) hours as needed (pain.).     aspirin  EC 81 MG tablet Take 1 tablet (81 mg total) by mouth daily. Swallow whole.     atorvastatin  (LIPITOR ) 80 MG tablet TAKE 1 TABLET(80 MG) BY MOUTH DAILY 90 tablet 3   Blood Glucose Monitoring Suppl (ACCU-CHEK GUIDE ME) w/Device KIT SMARTSIG:2-3 Times Daily     carvedilol  (COREG ) 25 MG tablet Take 1 tablet (25 mg total) by mouth 2 (two) times daily. 180 tablet 3   Continuous Glucose Sensor (FREESTYLE LIBRE 3 PLUS SENSOR) MISC by Does not apply route. Change sensor every 15 days.     diclofenac (VOLTAREN) 75 MG EC tablet Take 75 mg by  mouth at bedtime. Twice a day, or two pills at bedtime     esomeprazole (NEXIUM) 20 MG capsule Take 20 mg by mouth every morning. OTC     gabapentin  (NEURONTIN ) 300 MG capsule TAKE 1 CAPSULE BY MOUTH THREE TIMES DAILY 90 capsule 1   gatifloxacin (ZYMAXID) 0.5 % SOLN Place 1 drop into the left eye 4 (four) times daily.     HUMALOG  KWIKPEN 100 UNIT/ML KwikPen DIAL  AND INJECT 15 UNITS UNDER THE SKIN THREE TIMES DAILY. MAX DAILY DOSE IS 45 UNITS. 45 mL 0   insulin  glargine (LANTUS  SOLOSTAR) 100 UNIT/ML Solostar Pen Inject 40 Units into the skin 2 (two) times daily. Inject 40 units BID. (Patient taking differently: Inject 36 Units into the skin 2 (two) times daily. Inject 40 units BID.) 45 mL 2   lisinopril  (ZESTRIL ) 40 MG tablet Take 1 tablet (40 mg total) by mouth daily. 90 tablet 3   loratadine  (CLARITIN ) 10 MG tablet Take 10 mg by mouth in the morning.     meloxicam  (MOBIC ) 15 MG tablet TAKE 1 TABLET(15 MG) BY MOUTH DAILY AS NEEDED FOR PAIN 90 tablet 1   metFORMIN  (GLUCOPHAGE -XR) 500 MG 24 hr tablet TAKE 2 TABLETS(1000 MG) BY MOUTH DAILY WITH BREAKFAST 180 tablet 1   Multiple Vitamins-Minerals (MULTIVITAMIN WITH MINERALS) tablet Take 1 tablet  by mouth daily. Centrum 50+     nitroGLYCERIN  (NITROSTAT ) 0.4 MG SL tablet Place 1 tablet (0.4 mg total) under the tongue every 5 (five) minutes as needed for chest pain. 90 tablet 3   No current facility-administered medications on file prior to visit.  [2]  Allergies Allergen Reactions   Ozempic  (0.25 Or 0.5 Mg-Dose) [Semaglutide (0.25 Or 0.5mg -Dos)]     Developed pancreatitis

## 2024-09-24 NOTE — Telephone Encounter (Signed)
 Pt came to [facility] on 09/24/2024 to schedule appt for a wound on his LLE. Pt states he hit his shin on the dishwasher back in September and the wound has not healed yet. Pt states it will form a slight scab on the outer part of the wound but not fully. MP looked at the wound and saw the wound had a 2x2 gauze on top of the wound with yellow drainage on the gauze. MP pulled back gauze and observed that the wound is roughly the size of a nickel, and slightly deep. Per pt he saw his PCP around 10 days ago who give him 5 day abx(completed), what he thinks was saline to wash the wound out, and calcium  alginate to cover. MP sent pt on after informing pt that a phone call would be given to him once the RN had a chance to speak with the PA. TD, RN spoke with ME,PA who states pt should been seen with ABI US  and a PA visit. MP called pt and scheduled appt.

## 2024-09-24 NOTE — Telephone Encounter (Signed)
" ° ° °  Pharmacy Patient Advocate Encounter   Received notification from Pt Calls Messages that prior authorization for repatha  is required/requested.   Insurance verification completed.   The patient is insured through [employer].   Per test claim: PA required; PA submitted to above mentioned insurance via Latent Key/confirmation #/EOC [id number] Status is pending  "

## 2024-09-24 NOTE — Assessment & Plan Note (Signed)
 Assessment:  LDL goal: < 55 mg/dl; last LDLc  84  mg/dl (89/7974) Tolerates atorvastatin   well without any side effects; liver enzymes wnl Discussed next potential options (ezetimibe, PCSK-9 inhibitors); cost, dosing efficacy, side effects  Discussed and provided Planning Healthy Meals handout and emphasized importance of regular activity as tolerated; encouraged chair exercises   Plan: Continue taking atorvastatin  80 mg daily Will apply for PA for PCSK9i; will inform patient upon approval  Lipid lab due in 3 months after starting PCSK9i

## 2024-09-24 NOTE — Telephone Encounter (Signed)
 Pharmacy Patient Advocate Encounter  Received notification from [employer] that Prior Authorization for repatha  has been APPROVED from 09/24/24 to 10/06/25. Ran test claim, Copay is $190.95. This test claim was processed through [facility]- copay amounts may vary at other pharmacies due to pharmacy/plan contracts, or as the patient moves through the different stages of their insurance plan.   PA #/Case ID/Reference #: [id number]

## 2024-09-27 ENCOUNTER — Telehealth: Payer: Self-pay

## 2024-09-27 ENCOUNTER — Ambulatory Visit

## 2024-09-27 ENCOUNTER — Ambulatory Visit (HOSPITAL_COMMUNITY)

## 2024-09-27 NOTE — Telephone Encounter (Signed)
 Spoke with patient. Message below is in reference to Repatha .   Upon chart review, HW grant approved. Rx was sent to [facility] on 12/19 with [facility] grant approval info. Informed patient that [facility] will need to run Rx on insurance and then deduct copays from the grant funds. He voiced understanding.

## 2024-09-27 NOTE — Telephone Encounter (Signed)
 Pharm charging pt $125.00 instead of free. Please advise.

## 2024-10-01 ENCOUNTER — Other Ambulatory Visit (HOSPITAL_COMMUNITY): Payer: Self-pay

## 2024-10-01 MED ORDER — REPATHA SURECLICK 140 MG/ML ~~LOC~~ SOAJ
140.0000 mg | SUBCUTANEOUS | 2 refills | Status: AC
  Filled 2024-10-01 (×3): qty 2, 28d supply, fill #0

## 2024-10-01 NOTE — Telephone Encounter (Signed)
 Spoke with patient. [facility] fill at [facility]. Offer delivery. Patient accepted. Advised should arrive Tuesday. Called [facility] and had them reverse claim.

## 2024-10-01 NOTE — Addendum Note (Signed)
 Addended by: Isacc Turney D on: 10/01/2024 04:03 PM   Modules accepted: Orders

## 2024-10-01 NOTE — Telephone Encounter (Signed)
 PT calling to f/u on this matter. He states pharmacy was still charging for Rx and told him some of the grant info was not filled out. Please advise.

## 2024-10-10 DIAGNOSIS — G8929 Other chronic pain: Secondary | ICD-10-CM | POA: Insufficient documentation

## 2024-10-10 DIAGNOSIS — R6889 Other general symptoms and signs: Secondary | ICD-10-CM | POA: Insufficient documentation

## 2024-10-14 ENCOUNTER — Ambulatory Visit: Admitting: Podiatry

## 2024-10-14 ENCOUNTER — Encounter: Payer: Self-pay | Admitting: Podiatry

## 2024-10-14 DIAGNOSIS — B351 Tinea unguium: Secondary | ICD-10-CM | POA: Diagnosis not present

## 2024-10-14 DIAGNOSIS — M79675 Pain in left toe(s): Secondary | ICD-10-CM | POA: Diagnosis not present

## 2024-10-14 DIAGNOSIS — E1165 Type 2 diabetes mellitus with hyperglycemia: Secondary | ICD-10-CM

## 2024-10-14 DIAGNOSIS — Z794 Long term (current) use of insulin: Secondary | ICD-10-CM

## 2024-10-14 DIAGNOSIS — M79674 Pain in right toe(s): Secondary | ICD-10-CM

## 2024-10-14 NOTE — Progress Notes (Signed)
 This patient returns to my office for at risk foot care.  This patient requires this care by a professional since this patient will be at risk due to having diabetes and claudication.  This patient is unable to cut nails himself since the patient cannot reach his nails.These nails are painful walking and wearing shoes.  This patient presents for at risk foot care today.  General Appearance  Alert, conversant and in no acute stress.  Vascular  Dorsalis pedis and posterior tibial  pulses are palpable /diminished  bilaterally.  Capillary return is within normal limits  bilaterally. Temperature is within normal limits  bilaterally.  Neurologic  Senn-Weinstein monofilament wire test within normal limits  bilaterally. Muscle power within normal limits bilaterally.  Nails Thick disfigured discolored nails with subungual debris  from hallux to fifth toes bilaterally. No evidence of bacterial infection or drainage bilaterally.  Orthopedic  No limitations of motion  feet .  No crepitus or effusions noted.  No bony pathology or digital deformities noted.  Skin  normotropic skin with no porokeratosis noted bilaterally.  No signs of infections or ulcers noted.     Onychomycosis  Pain in right toes  Pain in left toes  Consent was obtained for treatment procedures.   Mechanical debridement of nails 1-5  bilaterally performed with a nail nipper.  Filed with dremel without incident.    Return office visit  3 months                    Told patient to return for periodic foot care and evaluation due to potential at risk complications.   Cordella Bold DPM sm

## 2024-10-27 ENCOUNTER — Other Ambulatory Visit: Payer: Self-pay

## 2024-10-27 DIAGNOSIS — I739 Peripheral vascular disease, unspecified: Secondary | ICD-10-CM

## 2024-11-04 ENCOUNTER — Ambulatory Visit: Payer: Self-pay

## 2024-11-04 ENCOUNTER — Ambulatory Visit: Attending: Cardiovascular Disease

## 2024-11-04 DIAGNOSIS — I10 Essential (primary) hypertension: Secondary | ICD-10-CM

## 2024-11-04 NOTE — Assessment & Plan Note (Addendum)
 Assessment: BP is uncontrolled in office BP 143/84 above the goal (<130/80) though improved from last visit No home BP log provided, patient reports home BP is typically 130-140s/80-90s, with occasional readings in the 120s  Has restarted amlodipine  (believes is 5 mg) since last PharmD visit and continues carvedilol  25 mg twice daily, lisinopril  40 mg daily  Denies SOB, palpitation, chest pain, headaches Reiterated the importance of regular exercise and low salt diet  The patient will send home BP readings and confirm his amlodipine  dose. Depending on confirmation, he will likely need to increase amlodipine  to 10 mg if currently on 5 mg, or add an additional antihypertensive agent, such as a thiazide diuretic, if further BP control is needed. Plan:  Continue carvedilol  25 mg twice daily, lisinopril  40 mg daily, and amlodipine  (5 mg?) daily. The patient will confirm his amlodipine  dose once he is home and send his blood pressure readings, then await further PharmD instructions. He should continue recording blood pressure and heart rate and report these at the next visit which we will schedule at phone follow up Bring the home BP monitor to the next appointment for validation.

## 2024-11-04 NOTE — Patient Instructions (Signed)
 Changes made by your pharmacist Daquane Aguilar E. Krystena Reitter, PharmD, CPP at today's visit:    Instructions/Changes  (what do you need to do) Your Notes  (what you did and when you did it)  Continue carvedilol  25 mg twice daily, lisinopril  40 mg daily and amlodipine  5 mg daily   2. Send me blood pressure readings on       Mychart and I will call you with       instructions     Bring all of your meds, your BP cuff and your record of home blood pressures to your next appointment.   Geneviene Tesch E. Delon Revelo, Pharm.D, CPP [facility]. [facility] [address], [place], [place] [address] Phone: [phone]; Fax: [phone]     HOW TO TAKE YOUR BLOOD PRESSURE AT HOME  Rest 5 minutes before taking your blood pressure.  Dont smoke or drink caffeinated beverages for at least 30 minutes before. Take your blood pressure before (not after) you eat. Sit comfortably with your back supported and both feet on the floor (dont cross your legs). Elevate your arm to heart level on a table or a desk. Use the proper sized cuff. It should fit smoothly and snugly around your bare upper arm. There should be enough room to slip a fingertip under the cuff. The bottom edge of the cuff should be 1 inch above the crease of the elbow. Ideally, take 3 measurements at one sitting and record the average.  Important lifestyle changes to control high blood pressure  Intervention  Effect on the BP  Lose extra pounds and watch your waistline Weight loss is one of the most effective lifestyle changes for controlling blood pressure. If you're overweight or obese, losing even a small amount of weight can help reduce blood pressure. Blood pressure might go down by about 1 millimeter of mercury (mm Hg) with each kilogram (about 2.2 pounds) of weight lost.  Exercise regularly As a general goal, aim for at least 30 minutes of moderate physical activity every day. Regular physical activity can  lower high blood pressure by about 5 to 8 mm Hg.  Eat a healthy diet Eating a diet rich in whole grains, fruits, vegetables, and low-fat dairy products and low in saturated fat and cholesterol. A healthy diet can lower high blood pressure by up to 11 mm Hg.  Reduce salt (sodium) in your diet Even a small reduction of sodium in the diet can improve heart health and reduce high blood pressure by about 5 to 6 mm Hg.  Limit alcohol  One drink equals 12 ounces of beer, 5 ounces of wine, or 1.5 ounces of 80-proof liquor.  Limiting alcohol  to less than one drink a day for women or two drinks a day for men can help lower blood pressure by about 4 mm Hg.   If you have any questions or concerns please use My Chart to send questions or call the office at [phone]

## 2024-11-04 NOTE — Telephone Encounter (Signed)
 FYI Only or Action Required?: FYI only for provider: appointment scheduled on 11/08/24.  Patient was last seen in primary care on 07/21/2023 by Erik Bring, DO.  Called Nurse Triage reporting Laceration.  Symptoms began several months ago.  Interventions attempted: Other: Keeping wound clean and dry, triple abx ointment, keeping it covered.  Symptoms are: stable.  Triage Disposition: See PCP When Office is Open (Within 3 Days)  Patient/caregiver understands and will follow disposition?: Yes   Reason for Disposition  [1] Diabetes mellitus AND [2] minor cut or scratch on foot  Answer Assessment - Initial Assessment Questions Pt reports no drainage currently, but 1-2 months since any drainage. Patient has been using triple antibiotic cream, keeping it clean and dressed at home.   1. APPEARANCE of INJURY: What does the injury look like?      Size of a nickel, dark scab  2. ONSET: How long ago did the injury occur?      On going since July  3. LOCATION: Where is the injury located?      Left leg, about 7 inches up from the ankle  5. BLEEDING: Is it bleeding now? If Yes, ask: Is it difficult to stop?      Denies  6. PAIN: Is there any pain? If Yes, ask: How bad is the pain? (Scale 0-10; or none, mild, moderate, severe)     Denies  7. MECHANISM: Tell me how it happened.      Banged it in July on a work cart created the wound, slowly healing, in September banged it on the dishwasher door, re-opened wound, since then it has not healed, keeps opening up, scabbing, then re-opening.  Protocols used: Cuts and At[employer] from [place] [facility] sent at 11/04/2024  2:56 PM EST  Reason for Triage: pt has a wound question. not healing fully.  no bleeding, open wound, painful Provider Dr Erik

## 2024-11-04 NOTE — Progress Notes (Signed)
 Patient ID: Erik Lachman Neels                  DOB: 1955-12-07                    MRN: [id number]      HPI: Erik Rose  is a 69 y.o. male patient referred to lipid clinic by Raphael Bring, PA. PMH is significant for HLD, CAD s/p possible PCI in 2010, CVA (2017), TIA (2018), T2DM, PAD s/p LE bypass, and HTN.   11/04/2024 (Today):  The patient presents today for a PharmD hypertension follow-up appointment. He was last seen in December, at which time no changes were made to his antihypertensive regimen due to the absence of home blood pressure readings. He is currently taking carvedilol  25 mg twice daily, lisinopril  40 mg daily, and amlodipine  5 mg daily. He reports that he restarted amlodipine  shortly after our previous visit because his blood pressure was elevated, and he has since noticed improvement.  He did not bring his blood pressure log or monitor today as previously instructed. He states he has been checking his blood pressure twice daily but left both the log and the device at home. He plans to send a photo of his log once he returns home. He recalls his blood pressure averaging approximately 130-140s/80-90s, with occasional readings in the 120s.  The patient also reports a non-healing wound on his lower leg that has been present since August of last year. He describes a pattern of scabbing followed by the scab falling off without complete healing. He has been evaluated at [facility] and is scheduled for follow-up with them in February. He notes that he ultimately needs to return to his original PCP, and I encouraged ongoing wound evaluation given the duration and lack of healing.  Current HTN meds: carvedilol  25 mg twice daily, lisinopril  40 mg daily, amlodipine  5 mg daily  Previously tried: amlodipine  (discontinued to reduce medication burden and normal BP) BP goal: < 130/80 Labs (07/2024): K 4.9, Scr 1.01, Crcl 83 ml/min, Na 139, Cl 103  Home BP readings: None provided -  patient to provide in mychart  Social History:  Alcohol : none Smoking: The patient reports smoking no more than 5-6 cigarettes per day. He previously maintained 94 days of abstinence but relapsed the day before Christmas. He is actively working on quitting again and remains determined. Support was offered, but he prefers to attempt cessation on his own for now.  09/24/2024 Patient presents today for PharmD HTN clinic. Patient is currently taking carvedilol  25 mg twice daily and lisinopril  40 mg daily for HTN management. He did not take his medications this morning. Patient states that he was previously on amlodipine  but discontinued by PCP as BP was normal and to reduce medication burden. Patient checks blood pressure at home but does not present with blood pressure log today. He states his BP at home is typically 120s-130s/70-80s. Denies any SOB, chest pain or headache.   Current HTN meds: carvedilol  25 mg twice daily, lisinopril  40 mg daily  Previously tried: amlodipine  (discontinued to reduce medication burden and normal BP) BP goal: < 130/80 Labs (07/2024): K 4.9, Scr 1.01, Crcl 83 ml/min, Na 139, Cl 103  Home BP readings:  No blood pressure log provided today   Diet: Patient does not eat much fast food except breakfast sandwich from [employer] Breakfast: eggs, bacon and toast, or [employer] breakfast sandwich, coffee with creamer or black  Lunch/Dinner: varies, sometimes he  skips lunch; spaghetti, peas, asparagus, brussel sprouts  Snacks: ice cream  Beverages: water , pepsi (6-8 oz/day)  Exercise:  Every day walking; no structured exercise due to arthritis in back; uses a walker  Social History:  Alcohol : none Smoking: none, 90 days without a cigarette   Family History:  Relation Problem Comments  Mother (Deceased) Hypertension   Kidney disease   Miscarriages / Stillbirths   Stroke     Father (Deceased) Heart disease     Sister - 2 (Alive) Depression   Kidney disease      Brother - 1 (Alive) Diabetes   Hyperlipidemia   Hypertension   Kidney disease       Labs:  Lipid Panel     Component Value Date/Time   CHOL 143 07/28/2024 1004   TRIG 82 07/28/2024 1004   HDL 43 07/28/2024 1004   CHOLHDL 3.3 07/28/2024 1004   CHOLHDL 2.6 01/03/2022 0500   VLDL 13 01/03/2022 0500   LDLCALC 84 07/28/2024 1004   LDLCALC 60 11/15/2021 0000   LDLDIRECT 112.0 09/09/2018 1513   LABVLDL 16 07/28/2024 1004    Past Medical History:  Diagnosis Date   Anemia    CAD (coronary artery disease)    Diabetes mellitus without complication (HCC)    Diverticulitis    GERD (gastroesophageal reflux disease)    Heart disease    History of kidney stones    passed   Hypertension    Myocardial infarction (HCC)    small one, blood numbers did not rise.   Peripheral vascular disease    Stroke (HCC) 2018   no residual effects   TIA (transient ischemic attack) 2017    Medications Ordered Prior to Encounter[1]  Allergies[2]  Assessment/Plan:  1. Hyperlipidemia -  Problem  Essential Hypertension    Essential hypertension Assessment: BP is uncontrolled in office BP 143/84 above the goal (<130/80) though improved from last visit No home BP log provided, patient reports home BP is typically 130-140s/80-90s, with occasional readings in the 120s  Has restarted amlodipine  (believes is 5 mg) since last PharmD visit and continues carvedilol  25 mg twice daily, lisinopril  40 mg daily  Denies SOB, palpitation, chest pain, headaches Reiterated the importance of regular exercise and low salt diet  The patient will send home BP readings and confirm his amlodipine  dose. Depending on confirmation, he will likely need to increase amlodipine  to 10 mg if currently on 5 mg, or add an additional antihypertensive agent, such as a thiazide diuretic, if further BP control is needed. Plan:  Continue carvedilol  25 mg twice daily, lisinopril  40 mg daily, and amlodipine  (5 mg?) daily. The  patient will confirm his amlodipine  dose once he is home and send his blood pressure readings, then await further PharmD instructions. He should continue recording blood pressure and heart rate and report these at the next visit which we will schedule at phone follow up Bring the home BP monitor to the next appointment for validation.   Thank you,  Natanya Holecek E. Keturah Yerby, Pharm.D, CPP [facility] [facility]. [facility] [address], [place], [place] [address] Phone: [phone]; Fax: [phone]        [1]  Current Outpatient Medications on File Prior to Visit  Medication Sig Dispense Refill   amLODipine  (NORVASC ) 5 MG tablet Take 5 mg by mouth daily.     carvedilol  (COREG ) 25 MG tablet Take 1 tablet (25 mg total) by mouth 2 (two) times daily. 180  tablet 3   lisinopril  (ZESTRIL ) 40 MG tablet Take 1 tablet (40 mg total) by mouth daily. 90 tablet 3   acetaminophen  (TYLENOL ) 500 MG tablet Take 1,000 mg by mouth every 6 (six) hours as needed (pain.).     aspirin  EC 81 MG tablet Take 1 tablet (81 mg total) by mouth daily. Swallow whole.     atorvastatin  (LIPITOR ) 80 MG tablet TAKE 1 TABLET(80 MG) BY MOUTH DAILY 90 tablet 3   Blood Glucose Monitoring Suppl (ACCU-CHEK GUIDE ME) w/Device KIT SMARTSIG:2-3 Times Daily     Continuous Glucose Sensor (FREESTYLE LIBRE 3 PLUS SENSOR) MISC by Does not apply route. Change sensor every 15 days.     diclofenac (VOLTAREN) 75 MG EC tablet Take 75 mg by mouth at bedtime. Twice a day, or two pills at bedtime     esomeprazole (NEXIUM) 20 MG capsule Take 20 mg by mouth every morning. OTC     Evolocumab  (REPATHA  SURECLICK) 140 MG/ML SOAJ Inject 140 mg into the skin every 14 (fourteen) days. 2 mL 2   gabapentin  (NEURONTIN ) 300 MG capsule TAKE 1 CAPSULE BY MOUTH THREE TIMES DAILY 90 capsule 1   gatifloxacin (ZYMAXID) 0.5 % SOLN Place 1 drop into the left eye 4 (four) times daily.     HUMALOG  KWIKPEN 100 UNIT/ML KwikPen DIAL  AND  INJECT 15 UNITS UNDER THE SKIN THREE TIMES DAILY. MAX DAILY DOSE IS 45 UNITS. 45 mL 0   insulin  glargine (LANTUS  SOLOSTAR) 100 UNIT/ML Solostar Pen Inject 40 Units into the skin 2 (two) times daily. Inject 40 units BID. (Patient taking differently: Inject 36 Units into the skin 2 (two) times daily. Inject 40 units BID.) 45 mL 2   loratadine  (CLARITIN ) 10 MG tablet Take 10 mg by mouth in the morning.     meloxicam  (MOBIC ) 15 MG tablet TAKE 1 TABLET(15 MG) BY MOUTH DAILY AS NEEDED FOR PAIN 90 tablet 1   metFORMIN  (GLUCOPHAGE -XR) 500 MG 24 hr tablet TAKE 2 TABLETS(1000 MG) BY MOUTH DAILY WITH BREAKFAST 180 tablet 1   Multiple Vitamins-Minerals (MULTIVITAMIN WITH MINERALS) tablet Take 1 tablet by mouth daily. Centrum 50+     nitroGLYCERIN  (NITROSTAT ) 0.4 MG SL tablet Place 1 tablet (0.4 mg total) under the tongue every 5 (five) minutes as needed for chest pain. 90 tablet 3   No current facility-administered medications on file prior to visit.  [2]  Allergies Allergen Reactions   Ozempic  (0.25 Or 0.5 Mg-Dose) [Semaglutide (0.25 Or 0.5mg -Dos)]     Developed pancreatitis

## 2024-11-08 ENCOUNTER — Ambulatory Visit: Admitting: Family Medicine

## 2024-11-10 ENCOUNTER — Telehealth: Payer: Self-pay

## 2024-11-10 ENCOUNTER — Encounter: Payer: Self-pay | Admitting: Family Medicine

## 2024-11-10 ENCOUNTER — Ambulatory Visit: Admitting: Family Medicine

## 2024-11-10 VITALS — BP 134/78 | HR 82 | Ht 70.0 in | Wt 200.0 lb

## 2024-11-10 DIAGNOSIS — I10 Essential (primary) hypertension: Secondary | ICD-10-CM

## 2024-11-10 DIAGNOSIS — E782 Mixed hyperlipidemia: Secondary | ICD-10-CM | POA: Diagnosis not present

## 2024-11-10 DIAGNOSIS — E1165 Type 2 diabetes mellitus with hyperglycemia: Secondary | ICD-10-CM | POA: Diagnosis not present

## 2024-11-10 DIAGNOSIS — M5441 Lumbago with sciatica, right side: Secondary | ICD-10-CM | POA: Diagnosis not present

## 2024-11-10 DIAGNOSIS — Z794 Long term (current) use of insulin: Secondary | ICD-10-CM

## 2024-11-10 DIAGNOSIS — M48061 Spinal stenosis, lumbar region without neurogenic claudication: Secondary | ICD-10-CM | POA: Diagnosis not present

## 2024-11-10 DIAGNOSIS — M5442 Lumbago with sciatica, left side: Secondary | ICD-10-CM

## 2024-11-10 DIAGNOSIS — L97921 Non-pressure chronic ulcer of unspecified part of left lower leg limited to breakdown of skin: Secondary | ICD-10-CM | POA: Insufficient documentation

## 2024-11-10 DIAGNOSIS — G8929 Other chronic pain: Secondary | ICD-10-CM

## 2024-11-10 LAB — POCT GLYCOSYLATED HEMOGLOBIN (HGB A1C): Hemoglobin A1C: 8.8 % — AB (ref 4.0–5.6)

## 2024-11-10 MED ORDER — GABAPENTIN 300 MG PO CAPS: ORAL_CAPSULE | ORAL | 1 refills | Status: AC

## 2024-11-10 NOTE — Assessment & Plan Note (Addendum)
 BP is fairly well controlled. Low sodium diet encouraged.  Continue current medications.

## 2024-11-10 NOTE — Addendum Note (Signed)
 Addended by: Kareemah Grounds Z on: 11/10/2024 10:14 AM   Modules accepted: Orders

## 2024-11-10 NOTE — Assessment & Plan Note (Addendum)
 Aggressive lipid management with repatha  and atorvastatin  due to CAD, PAD and history of CVA.  This is being managed by cardiology at this time.

## 2024-11-10 NOTE — Assessment & Plan Note (Signed)
Gabapentin renewed.

## 2024-11-10 NOTE — Assessment & Plan Note (Signed)
 Diabetes is not well controlled at this time.  Referral back to [facility] to help with PAP as he is not receiving Lantus  correctly.  Continue humalog  at current strength. Follow up in 3 months.

## 2024-11-10 NOTE — Assessment & Plan Note (Signed)
 He has had some intermittent edema.  Weak pulses but present.  Has ABI next week.  Likely combination of venous stasis with diabetes and PAD.  Unna boot applied today and will recheck in 1 week.  Referral placed to wound care as well.

## 2024-11-10 NOTE — Progress Notes (Signed)
 Care Guide Pharmacy Note  11/10/2024 Name: Erik Rose  MRN: [id number] DOB: 1956-04-03  Referred By: No primary care provider on file. Reason for referral: Call Attempt #1 and Complex Care Management (Outreach to sch ref w/ LCSW)   Erik Rose  is a 69 y.o. year old male who is a primary care patient of No primary care provider on file.Erik Rose  Erik Rose  was referred to the pharmacist for assistance related to: DMII  Successful contact was made with the patient to discuss pharmacy services including being ready for the pharmacist to call at least 5 minutes before the scheduled appointment time and to have medication bottles and any blood pressure readings ready for review. The patient agreed to meet with the pharmacist via telephone visit on 11/25/24.  Doyce Razor [facility], [facility] Guide Direct Dial : [phone]  Fax: [phone]

## 2024-11-10 NOTE — Progress Notes (Signed)
 " Erik Rose  - 69 y.o. male MRN [id number]  Date of birth: 27-Aug-1956  Subjective Chief Complaint  Patient presents with   Diabetes   Dressing Change    HPI Erik Rose  is a 69 y.o. male here today for re-establish care.  He had moved his care to [facility] but has not been pleased with his care there.    He has history of T2DM that is currently managed with lantus  40 units BID and humalog  20 units TID along with metformin .  He was on PAP for lantus  but has not had in the past couple of months due to his PAP paperwork not being completed correctly.  He is no longer receiving the correct amount of lantus .   Aggressive management of lipids with repatha  and lipitor .  He is seeing cardiology for management of this.   HTN is managed with amlodipine , coreg  and lisinopril .  BP is elevated today.  He has had some increased edema.    He has ulceration to L shin*.  He hit his shin on dishwasher in September and wound has not healed fully.   He is followed by vascular surgery as well who ordered ABI.  He has not had this completed yet.  He does have some intermittent swelling in the extremity.  No drainage from wound.  Previous pcp has tried calcium  alginate without improvement.   ROS:  A comprehensive ROS was completed and negative except as noted per HPI  Allergies[1]  Past Medical History:  Diagnosis Date   Anemia    CAD (coronary artery disease)    Diabetes mellitus without complication (HCC)    Diverticulitis    GERD (gastroesophageal reflux disease)    Heart disease    History of kidney stones    passed   Hypertension    Myocardial infarction (HCC)    small one, blood numbers did not rise.   Peripheral vascular disease    Stroke (HCC) 2018   no residual effects   TIA (transient ischemic attack) 2017    Past Surgical History:  Procedure Laterality Date   ABDOMINAL AORTOGRAM W/LOWER EXTREMITY N/A 12/28/2021   Procedure: ABDOMINAL AORTOGRAM W/LOWER  EXTREMITY;  Surgeon: Magda Debby SAILOR, MD;  Location: [facility];  Service: Cardiovascular;  Laterality: N/A;   APPLICATION OF WOUND VAC Right 02/28/2022   Procedure: APPLICATION OF WOUND VAC;  Surgeon: Magda Debby SAILOR, MD;  Location: [facility] OR;  Service: Vascular;  Laterality: Right;   BYPASS GRAFT FEMORAL-PERONEAL Right 01/02/2022   Procedure: RIGHT FEMORAL-PERONEAL BYPASS GRAFT;  Surgeon: Magda Debby SAILOR, MD;  Location: [facility] OR;  Service: Vascular;  Laterality: Right;   CARDIAC CATHETERIZATION     CORONARY ANGIOPLASTY WITH STENT PLACEMENT  2010   INCISION AND DRAINAGE OF WOUND Right 02/28/2022   Procedure: IRRIGATION AND DEBRIDEMENT RIGHT LEG WOUND;  Surgeon: Magda Debby SAILOR, MD;  Location: [facility] OR;  Service: Vascular;  Laterality: Right;   LEG ANGIOGRAPHY Right 01/02/2022   Procedure: LEG ANGIOGRAPHY;  Surgeon: Magda Debby SAILOR, MD;  Location: [facility] OR;  Service: Vascular;  Laterality: Right;    Social History   Socioeconomic History   Marital status: Married    Spouse name: Rock   Number of children: 4   Years of education: 14   Highest education level: Associate degree: academic program  Occupational History    Comment: Disability/retired.  Tobacco Use   Smoking status: Every Day    Current packs/day: 0.50    Average packs/day:  0.5 packs/day for 38.0 years (19.0 ttl pk-yrs)    Types: Cigarettes    Passive exposure: Never   Smokeless tobacco: Never   Tobacco comments:    5-8 cigarettes a day  Vaping Use   Vaping status: Never Used  Substance and Sexual Activity   Alcohol  use: Yes    Comment: occasional   Drug use: Not Currently    Types: Marijuana, Cocaine    Comment: stopped 1991   Sexual activity: Yes    Partners: Female  Other Topics Concern   Not on file  Social History Narrative   Live alone. 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 Likes to [employer] range once a month.    Social Drivers of Health   Tobacco Use: High Risk (11/10/2024)   Patient History    Smoking Tobacco  Use: Every Day    Smokeless Tobacco Use: Never    Passive Exposure: Never  Financial Resource Strain: High Risk (11/03/2024)   Overall Financial Resource Strain (CARDIA)    Difficulty of Paying Living Expenses: Very hard  Food Insecurity: Food Insecurity Present (11/03/2024)   Epic    Worried About Programme Researcher, Broadcasting/film/video in the Last Year: Often true    Ran Out of Food in the Last Year: Often true  Transportation Needs: No Transportation Needs (11/03/2024)   Epic    Lack of Transportation (Medical): No    Lack of Transportation (Non-Medical): No  Physical Activity: Insufficiently Active (11/03/2024)   Exercise Vital Sign    Days of Exercise per Week: 3 days    Minutes of Exercise per Session: 30 min  Stress: Stress Concern Present (11/03/2024)   [employer] of Occupational Health - Occupational Stress Questionnaire    Feeling of Stress: To some extent  Social Connections: Moderately Isolated (11/03/2024)   Social Connection and Isolation Panel    Frequency of Communication with Friends and Family: More than three times a week    Frequency of Social Gatherings with Friends and Family: Three times a week    Attends Religious Services: Never    Active Member of Clubs or Organizations: No    Attends Banker Meetings: Not on file    Marital Status: Married  Depression (PHQ2-9): Low Risk (11/10/2024)   Depression (PHQ2-9)    PHQ-2 Score: 2  Alcohol  Screen: Low Risk (07/20/2023)   Alcohol  Screen    Last Alcohol  Screening Score (AUDIT): 0  Housing: High Risk (11/03/2024)   Epic    Unable to Pay for Housing in the Last Year: Yes    Number of Times Moved in the Last Year: 0    Homeless in the Last Year: No  Utilities: Not on file  Health Literacy: Not on file    Family History  Problem Relation Age of Onset   Hypertension Mother    Kidney disease Mother    Miscarriages / Stillbirths Mother    Stroke Mother    Heart disease Father    Depression Sister    Kidney disease  Sister    Diabetes Brother    Hypertension Brother    Hyperlipidemia Brother    Kidney disease Brother     Health Maintenance  Topic Date Due   Hepatitis C Screening  Never done   Colonoscopy  01/17/2019   Diabetic kidney evaluation - Urine ACR  08/16/2023   Influenza Vaccine  05/07/2024   COVID-19 Vaccine (9 - 2025-26 season) 06/07/2024   Medicare Annual Wellness (AWV)  07/01/2024   Lung  Cancer Screening  04/21/2025   HEMOGLOBIN A1C  05/10/2025   Diabetic kidney evaluation - eGFR measurement  07/28/2025   OPHTHALMOLOGY EXAM  09/16/2025   FOOT EXAM  11/10/2025   DTaP/Tdap/Td (2 - Td or Tdap) 07/19/2030   Pneumococcal Vaccine: 50+ Years  Completed   Zoster Vaccines- Shingrix  Completed   Meningococcal B Vaccine  Aged Out     ----------------------------------------------------------------------------------------------------------------------------------------------------------------------------------------------------------------- Physical Exam BP 134/78   Pulse 82   Ht 5' 10 (1.778 m)   Wt 200 lb (90.7 kg)   SpO2 100%   BMI 28.70 kg/m   Physical Exam Constitutional:      Appearance: Normal appearance.  HENT:     Head: Normocephalic and atraumatic.  Eyes:     General: No scleral icterus. Cardiovascular:     Rate and Rhythm: Normal rate and regular rhythm.     Comments: DP and PT pulses are 1+ b/l Pulmonary:     Effort: Pulmonary effort is normal.     Breath sounds: Normal breath sounds.  Skin:    General: Skin is warm.     Comments: Ulceration to L shin ~1.5cm in diameter.  No drainage or slough.  Mildly tender without surrounding erythema.    Neurological:     General: No focal deficit present.     Mental Status: He is alert.  Psychiatric:        Mood and Affect: Mood normal.        Behavior: Behavior normal.      ------------------------------------------------------------------------------------------------------------------------------------------------------------------------------------------------------------------- Assessment and Plan  Type 2 diabetes mellitus with hyperglycemia, with long-term current use of insulin  (HCC) Diabetes is not well controlled at this time.  Referral back to [facility] to help with PAP as he is not receiving Lantus  correctly.  Continue humalog  at current strength. Follow up in 3 months.   Essential hypertension BP is fairly well controlled. Low sodium diet encouraged.  Continue current medications.   Mixed hyperlipidemia Aggressive lipid management with repatha  and atorvastatin  due to CAD, PAD and history of CVA.  This is being managed by cardiology at this time.   Lower extremity ulceration, left, limited to breakdown of skin (HCC) He has had some intermittent edema.  Weak pulses but present.  Has ABI next week.  Likely combination of venous stasis with diabetes and PAD.  Unna boot applied today and will recheck in 1 week.  Referral placed to wound care as well.   Chronic bilateral low back pain with bilateral sciatica Gabapentin  renewed.     Meds ordered this encounter  Medications   gabapentin  (NEURONTIN ) 300 MG capsule    Sig: TAKE 1 CAPSULE BY MOUTH THREE TIMES DAILY    Dispense:  90 capsule    Refill:  1    Return in about 1 week (around 11/17/2024) for Nurse visit for [employer] removal-morning appt due to vascular appt in the afternoon..        [1]  Allergies Allergen Reactions   Ozempic  (0.25 Or 0.5 Mg-Dose) [Semaglutide (0.25 Or 0.5mg -Dos)]     Developed pancreatitis   "

## 2024-11-11 LAB — MICROALBUMIN / CREATININE URINE RATIO
Creatinine, Urine: 66.2 mg/dL
Microalb/Creat Ratio: 96 mg/g{creat} — ABNORMAL HIGH (ref 0–29)
Microalbumin, Urine: 63.8 ug/mL

## 2024-11-17 ENCOUNTER — Ambulatory Visit

## 2024-11-17 ENCOUNTER — Ambulatory Visit (HOSPITAL_COMMUNITY)

## 2024-11-22 ENCOUNTER — Ambulatory Visit: Admitting: Orthopedic Surgery

## 2024-11-25 ENCOUNTER — Other Ambulatory Visit

## 2025-01-12 ENCOUNTER — Ambulatory Visit: Admitting: Podiatry
